# Patient Record
Sex: Female | Born: 1985 | Race: Black or African American | Hispanic: No | Marital: Married | Smoking: Former smoker
Health system: Southern US, Community
[De-identification: ages and names within clinical notes are randomized; demographics above are authoritative.]

## PROBLEM LIST (undated history)

## (undated) ENCOUNTER — Inpatient Hospital Stay: Payer: Self-pay

## (undated) DIAGNOSIS — K649 Unspecified hemorrhoids: Secondary | ICD-10-CM

## (undated) DIAGNOSIS — L309 Dermatitis, unspecified: Secondary | ICD-10-CM

## (undated) DIAGNOSIS — R51 Headache: Secondary | ICD-10-CM

## (undated) DIAGNOSIS — A749 Chlamydial infection, unspecified: Secondary | ICD-10-CM

## (undated) DIAGNOSIS — E669 Obesity, unspecified: Secondary | ICD-10-CM

## (undated) DIAGNOSIS — M549 Dorsalgia, unspecified: Secondary | ICD-10-CM

## (undated) DIAGNOSIS — F32A Depression, unspecified: Secondary | ICD-10-CM

## (undated) DIAGNOSIS — F329 Major depressive disorder, single episode, unspecified: Secondary | ICD-10-CM

## (undated) DIAGNOSIS — R519 Headache, unspecified: Secondary | ICD-10-CM

## (undated) DIAGNOSIS — F4310 Post-traumatic stress disorder, unspecified: Secondary | ICD-10-CM

## (undated) DIAGNOSIS — N186 End stage renal disease: Secondary | ICD-10-CM

## (undated) DIAGNOSIS — I639 Cerebral infarction, unspecified: Secondary | ICD-10-CM

## (undated) DIAGNOSIS — I10 Essential (primary) hypertension: Secondary | ICD-10-CM

## (undated) DIAGNOSIS — E109 Type 1 diabetes mellitus without complications: Secondary | ICD-10-CM

## (undated) HISTORY — PX: VASCULAR ACCESS SURGERY: SHX5217

## (undated) HISTORY — DX: End stage renal disease: N18.6

## (undated) HISTORY — DX: Type 1 diabetes mellitus without complications: E10.9

## (undated) HISTORY — PX: DILATION AND CURETTAGE, DIAGNOSTIC / THERAPEUTIC: SUR384

## (undated) HISTORY — PX: TONSILLECTOMY: SUR1361

## (undated) HISTORY — PX: EYE SURGERY: SHX253

---
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
## 2005-06-30 ENCOUNTER — Ambulatory Visit (EMERGENCY_DEPARTMENT_HOSPITAL): Payer: Self-pay

## 2006-07-19 ENCOUNTER — Ambulatory Visit (EMERGENCY_DEPARTMENT_HOSPITAL): Payer: Self-pay

## 2006-07-23 ENCOUNTER — Emergency Department: Payer: Self-pay | Admitting: Emergency Medicine

## 2006-07-28 ENCOUNTER — Encounter (HOSPITAL_BASED_OUTPATIENT_CLINIC_OR_DEPARTMENT_OTHER): Payer: Self-pay

## 2006-08-04 ENCOUNTER — Encounter (HOSPITAL_BASED_OUTPATIENT_CLINIC_OR_DEPARTMENT_OTHER): Payer: Self-pay | Admitting: Registered"

## 2006-08-16 ENCOUNTER — Encounter (HOSPITAL_BASED_OUTPATIENT_CLINIC_OR_DEPARTMENT_OTHER): Payer: Self-pay | Admitting: Nurse Practitioner

## 2006-08-16 DIAGNOSIS — I10 Essential (primary) hypertension: Secondary | ICD-10-CM

## 2006-08-16 DIAGNOSIS — IMO0002 Reserved for concepts with insufficient information to code with codable children: Secondary | ICD-10-CM

## 2006-08-16 DIAGNOSIS — Z794 Long term (current) use of insulin: Secondary | ICD-10-CM

## 2006-08-16 DIAGNOSIS — R944 Abnormal results of kidney function studies: Secondary | ICD-10-CM

## 2006-08-30 ENCOUNTER — Encounter (HOSPITAL_BASED_OUTPATIENT_CLINIC_OR_DEPARTMENT_OTHER): Payer: Self-pay | Admitting: Registered"

## 2006-09-06 ENCOUNTER — Encounter (HOSPITAL_BASED_OUTPATIENT_CLINIC_OR_DEPARTMENT_OTHER): Payer: Self-pay | Admitting: Obstetrics & Gynecology

## 2006-09-06 ENCOUNTER — Encounter (HOSPITAL_BASED_OUTPATIENT_CLINIC_OR_DEPARTMENT_OTHER): Payer: Self-pay | Admitting: Nurse Practitioner

## 2006-09-06 DIAGNOSIS — I10 Essential (primary) hypertension: Secondary | ICD-10-CM

## 2006-09-06 DIAGNOSIS — R011 Cardiac murmur, unspecified: Secondary | ICD-10-CM

## 2006-09-06 DIAGNOSIS — E109 Type 1 diabetes mellitus without complications: Secondary | ICD-10-CM

## 2006-10-04 ENCOUNTER — Encounter (HOSPITAL_BASED_OUTPATIENT_CLINIC_OR_DEPARTMENT_OTHER): Payer: Self-pay | Admitting: Registered"

## 2006-10-11 ENCOUNTER — Encounter (HOSPITAL_BASED_OUTPATIENT_CLINIC_OR_DEPARTMENT_OTHER): Payer: Self-pay | Admitting: Obstetrics & Gynecology

## 2006-11-21 ENCOUNTER — Encounter (HOSPITAL_BASED_OUTPATIENT_CLINIC_OR_DEPARTMENT_OTHER): Payer: Self-pay | Admitting: Internal Medicine

## 2006-11-21 ENCOUNTER — Encounter (HOSPITAL_BASED_OUTPATIENT_CLINIC_OR_DEPARTMENT_OTHER): Payer: Self-pay | Admitting: Registered"

## 2006-11-21 DIAGNOSIS — Z794 Long term (current) use of insulin: Secondary | ICD-10-CM

## 2006-11-21 DIAGNOSIS — Z3009 Encounter for other general counseling and advice on contraception: Secondary | ICD-10-CM

## 2006-11-21 DIAGNOSIS — E109 Type 1 diabetes mellitus without complications: Secondary | ICD-10-CM

## 2006-11-21 DIAGNOSIS — I10 Essential (primary) hypertension: Secondary | ICD-10-CM

## 2007-01-20 ENCOUNTER — Encounter (HOSPITAL_BASED_OUTPATIENT_CLINIC_OR_DEPARTMENT_OTHER): Payer: Self-pay

## 2007-03-03 ENCOUNTER — Encounter (HOSPITAL_BASED_OUTPATIENT_CLINIC_OR_DEPARTMENT_OTHER): Payer: Self-pay | Admitting: Internal Medicine

## 2007-04-11 ENCOUNTER — Other Ambulatory Visit (HOSPITAL_BASED_OUTPATIENT_CLINIC_OR_DEPARTMENT_OTHER): Payer: Self-pay | Admitting: Internal Medicine

## 2007-04-11 ENCOUNTER — Encounter (HOSPITAL_BASED_OUTPATIENT_CLINIC_OR_DEPARTMENT_OTHER): Payer: Medicaid Other | Admitting: Internal Medicine

## 2007-04-11 DIAGNOSIS — I10 Essential (primary) hypertension: Secondary | ICD-10-CM

## 2007-04-11 DIAGNOSIS — E119 Type 2 diabetes mellitus without complications: Secondary | ICD-10-CM

## 2007-04-11 DIAGNOSIS — Z124 Encounter for screening for malignant neoplasm of cervix: Secondary | ICD-10-CM

## 2007-04-13 LAB — CERVICAL CANCER SCREENING: Cytologic Impression: NEGATIVE

## 2007-05-12 ENCOUNTER — Encounter (HOSPITAL_BASED_OUTPATIENT_CLINIC_OR_DEPARTMENT_OTHER): Payer: Medicaid Other | Admitting: Internal Medicine

## 2007-05-12 DIAGNOSIS — I10 Essential (primary) hypertension: Secondary | ICD-10-CM

## 2007-05-12 DIAGNOSIS — E109 Type 1 diabetes mellitus without complications: Secondary | ICD-10-CM

## 2007-06-06 ENCOUNTER — Encounter (HOSPITAL_BASED_OUTPATIENT_CLINIC_OR_DEPARTMENT_OTHER): Payer: Self-pay | Admitting: Registered"

## 2007-06-09 ENCOUNTER — Encounter (HOSPITAL_BASED_OUTPATIENT_CLINIC_OR_DEPARTMENT_OTHER): Payer: Self-pay | Admitting: Internal Medicine

## 2007-06-16 ENCOUNTER — Encounter (HOSPITAL_BASED_OUTPATIENT_CLINIC_OR_DEPARTMENT_OTHER): Payer: Medicaid Other | Admitting: Internal Medicine

## 2007-06-16 DIAGNOSIS — I10 Essential (primary) hypertension: Secondary | ICD-10-CM

## 2007-06-16 DIAGNOSIS — E109 Type 1 diabetes mellitus without complications: Secondary | ICD-10-CM

## 2007-06-19 ENCOUNTER — Encounter (HOSPITAL_BASED_OUTPATIENT_CLINIC_OR_DEPARTMENT_OTHER): Payer: Self-pay | Admitting: Registered"

## 2007-06-26 ENCOUNTER — Encounter (HOSPITAL_BASED_OUTPATIENT_CLINIC_OR_DEPARTMENT_OTHER): Payer: Self-pay | Admitting: Registered"

## 2007-07-10 ENCOUNTER — Encounter (HOSPITAL_BASED_OUTPATIENT_CLINIC_OR_DEPARTMENT_OTHER): Payer: Self-pay | Admitting: Internal Medicine

## 2007-08-02 ENCOUNTER — Encounter (HOSPITAL_BASED_OUTPATIENT_CLINIC_OR_DEPARTMENT_OTHER): Payer: Self-pay | Admitting: "Endocrinology

## 2007-09-08 ENCOUNTER — Encounter (HOSPITAL_BASED_OUTPATIENT_CLINIC_OR_DEPARTMENT_OTHER): Payer: Medicaid Other

## 2007-09-15 ENCOUNTER — Inpatient Hospital Stay: Payer: Self-pay | Admitting: Obstetrics & Gynecology

## 2007-10-09 ENCOUNTER — Encounter: Payer: Self-pay | Admitting: Maternal & Fetal Medicine

## 2008-01-10 ENCOUNTER — Ambulatory Visit (EMERGENCY_DEPARTMENT_HOSPITAL): Payer: Self-pay

## 2008-01-16 ENCOUNTER — Encounter (HOSPITAL_BASED_OUTPATIENT_CLINIC_OR_DEPARTMENT_OTHER): Payer: Self-pay | Admitting: Internal Medicine

## 2008-01-22 ENCOUNTER — Encounter (HOSPITAL_BASED_OUTPATIENT_CLINIC_OR_DEPARTMENT_OTHER): Payer: Self-pay | Admitting: Internal Medicine

## 2008-02-20 ENCOUNTER — Encounter (HOSPITAL_BASED_OUTPATIENT_CLINIC_OR_DEPARTMENT_OTHER): Payer: Self-pay | Admitting: Nurse Practitioner

## 2008-02-20 DIAGNOSIS — E109 Type 1 diabetes mellitus without complications: Secondary | ICD-10-CM

## 2008-02-20 DIAGNOSIS — I10 Essential (primary) hypertension: Secondary | ICD-10-CM

## 2008-03-27 ENCOUNTER — Encounter (HOSPITAL_BASED_OUTPATIENT_CLINIC_OR_DEPARTMENT_OTHER): Payer: Self-pay

## 2008-04-02 ENCOUNTER — Encounter (HOSPITAL_BASED_OUTPATIENT_CLINIC_OR_DEPARTMENT_OTHER): Payer: Self-pay | Admitting: Nurse Practitioner

## 2008-04-02 ENCOUNTER — Encounter (HOSPITAL_BASED_OUTPATIENT_CLINIC_OR_DEPARTMENT_OTHER): Payer: Self-pay | Admitting: Registered"

## 2008-07-12 ENCOUNTER — Encounter (HOSPITAL_BASED_OUTPATIENT_CLINIC_OR_DEPARTMENT_OTHER): Payer: Self-pay

## 2008-08-05 ENCOUNTER — Ambulatory Visit: Payer: Self-pay | Admitting: Unknown Physician Specialty

## 2008-08-06 ENCOUNTER — Ambulatory Visit: Payer: Self-pay | Admitting: Unknown Physician Specialty

## 2008-09-11 ENCOUNTER — Encounter (HOSPITAL_BASED_OUTPATIENT_CLINIC_OR_DEPARTMENT_OTHER): Payer: Self-pay

## 2008-09-16 ENCOUNTER — Ambulatory Visit (HOSPITAL_BASED_OUTPATIENT_CLINIC_OR_DEPARTMENT_OTHER): Payer: No Typology Code available for payment source | Admitting: Nurse Practitioner

## 2008-09-16 ENCOUNTER — Encounter (HOSPITAL_BASED_OUTPATIENT_CLINIC_OR_DEPARTMENT_OTHER): Payer: Self-pay | Admitting: Nurse Practitioner

## 2008-09-16 DIAGNOSIS — E119 Type 2 diabetes mellitus without complications: Secondary | ICD-10-CM

## 2008-09-16 DIAGNOSIS — I10 Essential (primary) hypertension: Secondary | ICD-10-CM

## 2009-01-09 ENCOUNTER — Ambulatory Visit (HOSPITAL_BASED_OUTPATIENT_CLINIC_OR_DEPARTMENT_OTHER): Payer: No Typology Code available for payment source | Admitting: Nurse Practitioner

## 2009-01-09 DIAGNOSIS — Z111 Encounter for screening for respiratory tuberculosis: Secondary | ICD-10-CM

## 2009-01-09 DIAGNOSIS — Z0289 Encounter for other administrative examinations: Secondary | ICD-10-CM

## 2009-01-09 DIAGNOSIS — Z23 Encounter for immunization: Secondary | ICD-10-CM

## 2009-01-13 ENCOUNTER — Encounter (HOSPITAL_BASED_OUTPATIENT_CLINIC_OR_DEPARTMENT_OTHER): Payer: Self-pay

## 2009-02-03 ENCOUNTER — Ambulatory Visit (HOSPITAL_BASED_OUTPATIENT_CLINIC_OR_DEPARTMENT_OTHER): Payer: Self-pay | Attending: Nurse Practitioner

## 2009-02-12 ENCOUNTER — Ambulatory Visit (HOSPITAL_BASED_OUTPATIENT_CLINIC_OR_DEPARTMENT_OTHER): Payer: Self-pay | Attending: Internal Medicine | Admitting: Internal Medicine

## 2009-02-12 DIAGNOSIS — E109 Type 1 diabetes mellitus without complications: Secondary | ICD-10-CM | POA: Insufficient documentation

## 2009-03-07 ENCOUNTER — Encounter (HOSPITAL_BASED_OUTPATIENT_CLINIC_OR_DEPARTMENT_OTHER): Payer: Self-pay

## 2009-03-11 ENCOUNTER — Ambulatory Visit (HOSPITAL_BASED_OUTPATIENT_CLINIC_OR_DEPARTMENT_OTHER)
Admit: 2009-03-11 | Discharge: 2009-03-11 | Disposition: A | Discharge status: Home / Self Care | Payer: Self-pay | Attending: Nurse Practitioner | Admitting: Nurse Practitioner

## 2009-03-12 ENCOUNTER — Ambulatory Visit (HOSPITAL_BASED_OUTPATIENT_CLINIC_OR_DEPARTMENT_OTHER): Payer: Self-pay | Attending: Internal Medicine

## 2009-03-12 DIAGNOSIS — Z794 Long term (current) use of insulin: Secondary | ICD-10-CM | POA: Insufficient documentation

## 2009-03-12 DIAGNOSIS — E119 Type 2 diabetes mellitus without complications: Secondary | ICD-10-CM | POA: Insufficient documentation

## 2009-03-12 DIAGNOSIS — Z5181 Encounter for therapeutic drug level monitoring: Secondary | ICD-10-CM | POA: Insufficient documentation

## 2009-03-17 ENCOUNTER — Encounter (HOSPITAL_BASED_OUTPATIENT_CLINIC_OR_DEPARTMENT_OTHER): Payer: Self-pay

## 2009-03-17 ENCOUNTER — Encounter (HOSPITAL_BASED_OUTPATIENT_CLINIC_OR_DEPARTMENT_OTHER): Payer: Self-pay | Admitting: Obstetrics & Gynecology

## 2009-03-27 ENCOUNTER — Ambulatory Visit (HOSPITAL_BASED_OUTPATIENT_CLINIC_OR_DEPARTMENT_OTHER): Payer: Self-pay | Attending: Surgery

## 2009-03-27 ENCOUNTER — Other Ambulatory Visit (HOSPITAL_BASED_OUTPATIENT_CLINIC_OR_DEPARTMENT_OTHER): Payer: Self-pay | Admitting: Obstetrics & Gynecology

## 2009-03-27 DIAGNOSIS — Z113 Encounter for screening for infections with a predominantly sexual mode of transmission: Secondary | ICD-10-CM | POA: Insufficient documentation

## 2009-03-27 DIAGNOSIS — Z118 Encounter for screening for other infectious and parasitic diseases: Secondary | ICD-10-CM | POA: Insufficient documentation

## 2009-03-27 DIAGNOSIS — Z3201 Encounter for pregnancy test, result positive: Secondary | ICD-10-CM | POA: Insufficient documentation

## 2009-03-27 DIAGNOSIS — A64 Unspecified sexually transmitted disease: Secondary | ICD-10-CM | POA: Insufficient documentation

## 2009-03-27 DIAGNOSIS — O10019 Pre-existing essential hypertension complicating pregnancy, unspecified trimester: Secondary | ICD-10-CM | POA: Insufficient documentation

## 2009-03-27 DIAGNOSIS — Z01419 Encounter for gynecological examination (general) (routine) without abnormal findings: Secondary | ICD-10-CM | POA: Insufficient documentation

## 2009-03-27 DIAGNOSIS — E109 Type 1 diabetes mellitus without complications: Secondary | ICD-10-CM

## 2009-04-01 LAB — CERVICAL CANCER SCREENING: Cytologic Impression: NEGATIVE

## 2009-04-07 ENCOUNTER — Encounter (HOSPITAL_BASED_OUTPATIENT_CLINIC_OR_DEPARTMENT_OTHER): Payer: Self-pay

## 2009-04-08 ENCOUNTER — Other Ambulatory Visit (HOSPITAL_BASED_OUTPATIENT_CLINIC_OR_DEPARTMENT_OTHER): Payer: Self-pay | Admitting: Obstetrics & Gynecology

## 2009-04-08 ENCOUNTER — Ambulatory Visit (HOSPITAL_BASED_OUTPATIENT_CLINIC_OR_DEPARTMENT_OTHER): Payer: Medicaid Other | Admitting: Obstetrics and Gynecology

## 2009-04-08 ENCOUNTER — Inpatient Hospital Stay (HOSPITAL_COMMUNITY)
Admission: AD | Admit: 2009-04-08 | Payer: No Typology Code available for payment source | Source: Ambulatory Visit | Admitting: Maternal & Fetal Medicine

## 2009-04-08 ENCOUNTER — Inpatient Hospital Stay
Admission: AD | Admit: 2009-04-08 | Discharge: 2009-04-10 | Disposition: A | Discharge status: Home / Self Care | Payer: Medicaid Other | Source: Ambulatory Visit | Attending: Obstetrics & Gynecology | Admitting: Obstetrics & Gynecology

## 2009-04-08 ENCOUNTER — Inpatient Hospital Stay (HOSPITAL_COMMUNITY): Payer: Medicaid Other | Admitting: Obstetrics & Gynecology

## 2009-04-08 ENCOUNTER — Ambulatory Visit (HOSPITAL_BASED_OUTPATIENT_CLINIC_OR_DEPARTMENT_OTHER): Payer: Medicaid Other

## 2009-04-08 DIAGNOSIS — O2390 Unspecified genitourinary tract infection in pregnancy, unspecified trimester: Secondary | ICD-10-CM | POA: Diagnosis present

## 2009-04-08 DIAGNOSIS — O10019 Pre-existing essential hypertension complicating pregnancy, unspecified trimester: Secondary | ICD-10-CM | POA: Diagnosis present

## 2009-04-08 DIAGNOSIS — N390 Urinary tract infection, site not specified: Secondary | ICD-10-CM | POA: Diagnosis present

## 2009-04-08 DIAGNOSIS — E109 Type 1 diabetes mellitus without complications: Secondary | ICD-10-CM | POA: Diagnosis present

## 2009-04-08 DIAGNOSIS — Z3480 Encounter for supervision of other normal pregnancy, unspecified trimester: Secondary | ICD-10-CM

## 2009-04-08 DIAGNOSIS — Z794 Long term (current) use of insulin: Secondary | ICD-10-CM

## 2009-04-08 LAB — PR SONO PREG FIRST TRIMESTER

## 2009-04-08 LAB — PR OB US NUCHAL MEAS, 1 GEST

## 2009-04-09 DIAGNOSIS — O10019 Pre-existing essential hypertension complicating pregnancy, unspecified trimester: Secondary | ICD-10-CM

## 2009-04-09 DIAGNOSIS — E119 Type 2 diabetes mellitus without complications: Secondary | ICD-10-CM

## 2009-04-10 DIAGNOSIS — R8271 Bacteriuria: Secondary | ICD-10-CM

## 2009-04-10 DIAGNOSIS — O99891 Other specified diseases and conditions complicating pregnancy: Secondary | ICD-10-CM

## 2009-04-15 ENCOUNTER — Ambulatory Visit (HOSPITAL_BASED_OUTPATIENT_CLINIC_OR_DEPARTMENT_OTHER): Payer: Medicaid Other | Admitting: Registered"

## 2009-04-15 ENCOUNTER — Ambulatory Visit: Payer: Medicaid Other | Attending: Obstetrics & Gynecology | Admitting: Obstetrics and Gynecology

## 2009-04-15 DIAGNOSIS — Z794 Long term (current) use of insulin: Secondary | ICD-10-CM | POA: Insufficient documentation

## 2009-04-15 DIAGNOSIS — D649 Anemia, unspecified: Secondary | ICD-10-CM | POA: Insufficient documentation

## 2009-04-15 DIAGNOSIS — O10019 Pre-existing essential hypertension complicating pregnancy, unspecified trimester: Secondary | ICD-10-CM | POA: Insufficient documentation

## 2009-04-15 DIAGNOSIS — O2390 Unspecified genitourinary tract infection in pregnancy, unspecified trimester: Secondary | ICD-10-CM | POA: Insufficient documentation

## 2009-04-15 DIAGNOSIS — E109 Type 1 diabetes mellitus without complications: Secondary | ICD-10-CM | POA: Insufficient documentation

## 2009-04-21 ENCOUNTER — Encounter (HOSPITAL_BASED_OUTPATIENT_CLINIC_OR_DEPARTMENT_OTHER): Payer: Self-pay | Admitting: Internal Medicine

## 2009-04-21 ENCOUNTER — Ambulatory Visit: Payer: Medicaid Other | Attending: Obstetrics & Gynecology | Admitting: Obstetrics and Gynecology

## 2009-04-21 DIAGNOSIS — E119 Type 2 diabetes mellitus without complications: Secondary | ICD-10-CM | POA: Insufficient documentation

## 2009-04-21 DIAGNOSIS — O10019 Pre-existing essential hypertension complicating pregnancy, unspecified trimester: Secondary | ICD-10-CM | POA: Insufficient documentation

## 2009-04-21 DIAGNOSIS — O2390 Unspecified genitourinary tract infection in pregnancy, unspecified trimester: Secondary | ICD-10-CM | POA: Insufficient documentation

## 2009-04-23 ENCOUNTER — Encounter (HOSPITAL_BASED_OUTPATIENT_CLINIC_OR_DEPARTMENT_OTHER): Payer: Self-pay | Admitting: Internal Medicine

## 2009-04-28 ENCOUNTER — Institutional Professional Consult (permissible substitution) (HOSPITAL_BASED_OUTPATIENT_CLINIC_OR_DEPARTMENT_OTHER): Payer: Medicaid Other | Admitting: Obstetrics and Gynecology

## 2009-04-29 ENCOUNTER — Institutional Professional Consult (permissible substitution) (HOSPITAL_BASED_OUTPATIENT_CLINIC_OR_DEPARTMENT_OTHER): Payer: Medicaid Other | Admitting: Obstetrics and Gynecology

## 2009-05-02 ENCOUNTER — Inpatient Hospital Stay
Admission: AD | Admit: 2009-05-02 | Discharge: 2009-05-03 | Disposition: A | Discharge status: Home / Self Care | Payer: Medicaid Other | Source: Ambulatory Visit | Attending: Obstetrics & Gynecology | Admitting: Obstetrics & Gynecology

## 2009-05-02 ENCOUNTER — Inpatient Hospital Stay (HOSPITAL_COMMUNITY): Payer: Medicaid Other | Admitting: Maternal & Fetal Medicine

## 2009-05-02 ENCOUNTER — Emergency Department (EMERGENCY_DEPARTMENT_HOSPITAL)
Admission: EM | Admit: 2009-05-02 | Discharge: 2009-05-02 | Disposition: A | Discharge status: Home / Self Care | Payer: Medicaid Other | Source: Home / Self Care

## 2009-05-02 DIAGNOSIS — E109 Type 1 diabetes mellitus without complications: Secondary | ICD-10-CM | POA: Diagnosis present

## 2009-05-02 DIAGNOSIS — O2390 Unspecified genitourinary tract infection in pregnancy, unspecified trimester: Secondary | ICD-10-CM | POA: Diagnosis present

## 2009-05-02 DIAGNOSIS — E119 Type 2 diabetes mellitus without complications: Secondary | ICD-10-CM

## 2009-05-02 DIAGNOSIS — O10019 Pre-existing essential hypertension complicating pregnancy, unspecified trimester: Secondary | ICD-10-CM | POA: Diagnosis present

## 2009-05-02 DIAGNOSIS — N390 Urinary tract infection, site not specified: Secondary | ICD-10-CM | POA: Diagnosis present

## 2009-05-03 DIAGNOSIS — E119 Type 2 diabetes mellitus without complications: Secondary | ICD-10-CM

## 2009-05-05 ENCOUNTER — Ambulatory Visit (HOSPITAL_BASED_OUTPATIENT_CLINIC_OR_DEPARTMENT_OTHER): Payer: No Typology Code available for payment source

## 2009-05-05 ENCOUNTER — Ambulatory Visit
Payer: No Typology Code available for payment source | Attending: Obstetrics & Gynecology | Admitting: Obstetrics and Gynecology

## 2009-05-05 DIAGNOSIS — Z331 Pregnant state, incidental: Secondary | ICD-10-CM | POA: Insufficient documentation

## 2009-05-05 DIAGNOSIS — O10019 Pre-existing essential hypertension complicating pregnancy, unspecified trimester: Secondary | ICD-10-CM | POA: Insufficient documentation

## 2009-05-05 DIAGNOSIS — D509 Iron deficiency anemia, unspecified: Secondary | ICD-10-CM | POA: Insufficient documentation

## 2009-05-05 DIAGNOSIS — E119 Type 2 diabetes mellitus without complications: Secondary | ICD-10-CM | POA: Insufficient documentation

## 2009-05-05 DIAGNOSIS — O2390 Unspecified genitourinary tract infection in pregnancy, unspecified trimester: Secondary | ICD-10-CM | POA: Insufficient documentation

## 2009-05-12 ENCOUNTER — Ambulatory Visit (HOSPITAL_BASED_OUTPATIENT_CLINIC_OR_DEPARTMENT_OTHER): Payer: No Typology Code available for payment source | Admitting: Clinical

## 2009-05-12 ENCOUNTER — Ambulatory Visit
Payer: No Typology Code available for payment source | Attending: Obstetrics & Gynecology | Admitting: Obstetrics and Gynecology

## 2009-05-12 DIAGNOSIS — O2390 Unspecified genitourinary tract infection in pregnancy, unspecified trimester: Secondary | ICD-10-CM | POA: Insufficient documentation

## 2009-05-12 DIAGNOSIS — E119 Type 2 diabetes mellitus without complications: Secondary | ICD-10-CM | POA: Insufficient documentation

## 2009-05-12 DIAGNOSIS — Z331 Pregnant state, incidental: Secondary | ICD-10-CM | POA: Insufficient documentation

## 2009-05-12 DIAGNOSIS — O10019 Pre-existing essential hypertension complicating pregnancy, unspecified trimester: Secondary | ICD-10-CM | POA: Insufficient documentation

## 2009-05-12 DIAGNOSIS — D509 Iron deficiency anemia, unspecified: Secondary | ICD-10-CM | POA: Insufficient documentation

## 2009-05-19 ENCOUNTER — Ambulatory Visit (HOSPITAL_BASED_OUTPATIENT_CLINIC_OR_DEPARTMENT_OTHER): Payer: No Typology Code available for payment source

## 2009-05-19 ENCOUNTER — Ambulatory Visit (HOSPITAL_BASED_OUTPATIENT_CLINIC_OR_DEPARTMENT_OTHER): Payer: No Typology Code available for payment source | Admitting: Obstetrics and Gynecology

## 2009-05-19 ENCOUNTER — Other Ambulatory Visit: Payer: Self-pay | Admitting: Obstetrics & Gynecology

## 2009-05-19 ENCOUNTER — Ambulatory Visit: Payer: No Typology Code available for payment source | Attending: Obstetrics & Gynecology

## 2009-05-19 DIAGNOSIS — Z331 Pregnant state, incidental: Secondary | ICD-10-CM | POA: Insufficient documentation

## 2009-05-19 DIAGNOSIS — E119 Type 2 diabetes mellitus without complications: Secondary | ICD-10-CM | POA: Insufficient documentation

## 2009-05-19 DIAGNOSIS — D509 Iron deficiency anemia, unspecified: Secondary | ICD-10-CM | POA: Insufficient documentation

## 2009-05-19 DIAGNOSIS — O2390 Unspecified genitourinary tract infection in pregnancy, unspecified trimester: Secondary | ICD-10-CM | POA: Insufficient documentation

## 2009-05-19 DIAGNOSIS — Z3480 Encounter for supervision of other normal pregnancy, unspecified trimester: Secondary | ICD-10-CM

## 2009-05-19 DIAGNOSIS — O10019 Pre-existing essential hypertension complicating pregnancy, unspecified trimester: Secondary | ICD-10-CM | POA: Insufficient documentation

## 2009-05-21 LAB — PR SONO PREG 2ND/3RD TRIMESTER

## 2009-05-26 ENCOUNTER — Ambulatory Visit
Payer: No Typology Code available for payment source | Attending: Obstetrics & Gynecology | Admitting: Obstetrics and Gynecology

## 2009-05-26 DIAGNOSIS — E119 Type 2 diabetes mellitus without complications: Secondary | ICD-10-CM | POA: Insufficient documentation

## 2009-05-26 DIAGNOSIS — O2390 Unspecified genitourinary tract infection in pregnancy, unspecified trimester: Secondary | ICD-10-CM | POA: Insufficient documentation

## 2009-05-26 DIAGNOSIS — O10019 Pre-existing essential hypertension complicating pregnancy, unspecified trimester: Secondary | ICD-10-CM | POA: Insufficient documentation

## 2009-05-26 DIAGNOSIS — D509 Iron deficiency anemia, unspecified: Secondary | ICD-10-CM | POA: Insufficient documentation

## 2009-05-29 ENCOUNTER — Inpatient Hospital Stay (HOSPITAL_COMMUNITY)
Admission: AD | Admit: 2009-05-29 | Payer: No Typology Code available for payment source | Source: Home / Self Care | Admitting: Obstetrics/Gynecology

## 2009-05-29 ENCOUNTER — Ambulatory Visit
Admission: AD | Admit: 2009-05-29 | Discharge: 2009-05-30 | Disposition: A | Discharge status: Home / Self Care | Payer: No Typology Code available for payment source | Attending: Obstetrics/Gynecology | Admitting: Obstetrics/Gynecology

## 2009-05-29 ENCOUNTER — Ambulatory Visit (HOSPITAL_BASED_OUTPATIENT_CLINIC_OR_DEPARTMENT_OTHER): Payer: No Typology Code available for payment source | Admitting: Obstetrics/Gynecology

## 2009-05-29 DIAGNOSIS — N390 Urinary tract infection, site not specified: Secondary | ICD-10-CM | POA: Insufficient documentation

## 2009-05-29 DIAGNOSIS — R0989 Other specified symptoms and signs involving the circulatory and respiratory systems: Secondary | ICD-10-CM

## 2009-05-29 DIAGNOSIS — O99891 Other specified diseases and conditions complicating pregnancy: Secondary | ICD-10-CM

## 2009-05-29 DIAGNOSIS — O10019 Pre-existing essential hypertension complicating pregnancy, unspecified trimester: Secondary | ICD-10-CM

## 2009-06-05 ENCOUNTER — Ambulatory Visit (HOSPITAL_BASED_OUTPATIENT_CLINIC_OR_DEPARTMENT_OTHER): Payer: No Typology Code available for payment source

## 2009-06-05 ENCOUNTER — Inpatient Hospital Stay
Admission: AD | Admit: 2009-06-05 | Discharge: 2009-06-13 | DRG: 886 | Disposition: A | Discharge status: Home / Self Care | Payer: No Typology Code available for payment source | Attending: Obstetrics/Gynecology | Admitting: Obstetrics/Gynecology

## 2009-06-05 ENCOUNTER — Ambulatory Visit (HOSPITAL_BASED_OUTPATIENT_CLINIC_OR_DEPARTMENT_OTHER): Payer: No Typology Code available for payment source | Admitting: Obstetrics & Gynecology

## 2009-06-05 ENCOUNTER — Inpatient Hospital Stay (HOSPITAL_COMMUNITY): Payer: No Typology Code available for payment source | Admitting: Obstetrics/Gynecology

## 2009-06-05 DIAGNOSIS — D568 Other thalassemias: Secondary | ICD-10-CM | POA: Diagnosis present

## 2009-06-05 DIAGNOSIS — O10419 Pre-existing secondary hypertension complicating pregnancy, unspecified trimester: Secondary | ICD-10-CM | POA: Insufficient documentation

## 2009-06-05 DIAGNOSIS — E119 Type 2 diabetes mellitus without complications: Secondary | ICD-10-CM | POA: Diagnosis present

## 2009-06-05 DIAGNOSIS — R0601 Orthopnea: Secondary | ICD-10-CM | POA: Diagnosis present

## 2009-06-05 DIAGNOSIS — O10019 Pre-existing essential hypertension complicating pregnancy, unspecified trimester: Secondary | ICD-10-CM | POA: Diagnosis present

## 2009-06-05 DIAGNOSIS — R0989 Other specified symptoms and signs involving the circulatory and respiratory systems: Secondary | ICD-10-CM

## 2009-06-05 DIAGNOSIS — I509 Heart failure, unspecified: Secondary | ICD-10-CM | POA: Diagnosis present

## 2009-06-05 DIAGNOSIS — J811 Chronic pulmonary edema: Secondary | ICD-10-CM | POA: Diagnosis present

## 2009-06-05 DIAGNOSIS — O2390 Unspecified genitourinary tract infection in pregnancy, unspecified trimester: Secondary | ICD-10-CM | POA: Diagnosis present

## 2009-06-05 DIAGNOSIS — N390 Urinary tract infection, site not specified: Secondary | ICD-10-CM | POA: Diagnosis present

## 2009-06-05 DIAGNOSIS — J90 Pleural effusion, not elsewhere classified: Secondary | ICD-10-CM | POA: Diagnosis present

## 2009-06-05 DIAGNOSIS — O99419 Diseases of the circulatory system complicating pregnancy, unspecified trimester: Secondary | ICD-10-CM | POA: Diagnosis present

## 2009-06-05 DIAGNOSIS — O99891 Other specified diseases and conditions complicating pregnancy: Principal | ICD-10-CM | POA: Diagnosis present

## 2009-06-05 DIAGNOSIS — I2510 Atherosclerotic heart disease of native coronary artery without angina pectoris: Secondary | ICD-10-CM | POA: Diagnosis present

## 2009-06-05 DIAGNOSIS — I151 Hypertension secondary to other renal disorders: Secondary | ICD-10-CM

## 2009-06-05 DIAGNOSIS — I10 Essential (primary) hypertension: Secondary | ICD-10-CM

## 2009-06-05 DIAGNOSIS — O321XX0 Maternal care for breech presentation, not applicable or unspecified: Secondary | ICD-10-CM | POA: Diagnosis present

## 2009-06-06 ENCOUNTER — Inpatient Hospital Stay (HOSPITAL_BASED_OUTPATIENT_CLINIC_OR_DEPARTMENT_OTHER): Payer: No Typology Code available for payment source

## 2009-06-06 ENCOUNTER — Ambulatory Visit (HOSPITAL_BASED_OUTPATIENT_CLINIC_OR_DEPARTMENT_OTHER): Payer: No Typology Code available for payment source

## 2009-06-06 ENCOUNTER — Other Ambulatory Visit: Payer: Self-pay | Admitting: Obstetrics & Gynecology

## 2009-06-06 ENCOUNTER — Encounter (HOSPITAL_BASED_OUTPATIENT_CLINIC_OR_DEPARTMENT_OTHER): Payer: No Typology Code available for payment source | Admitting: Ophthalmology

## 2009-06-06 DIAGNOSIS — R0601 Orthopnea: Secondary | ICD-10-CM

## 2009-06-06 DIAGNOSIS — R0989 Other specified symptoms and signs involving the circulatory and respiratory systems: Secondary | ICD-10-CM

## 2009-06-06 DIAGNOSIS — I517 Cardiomegaly: Secondary | ICD-10-CM

## 2009-06-06 DIAGNOSIS — O99419 Diseases of the circulatory system complicating pregnancy, unspecified trimester: Secondary | ICD-10-CM

## 2009-06-06 DIAGNOSIS — I10 Essential (primary) hypertension: Secondary | ICD-10-CM

## 2009-06-06 DIAGNOSIS — I2510 Atherosclerotic heart disease of native coronary artery without angina pectoris: Secondary | ICD-10-CM

## 2009-06-06 DIAGNOSIS — J984 Other disorders of lung: Secondary | ICD-10-CM

## 2009-06-06 DIAGNOSIS — O10019 Pre-existing essential hypertension complicating pregnancy, unspecified trimester: Secondary | ICD-10-CM

## 2009-06-06 DIAGNOSIS — I998 Other disorder of circulatory system: Secondary | ICD-10-CM

## 2009-06-06 DIAGNOSIS — R011 Cardiac murmur, unspecified: Secondary | ICD-10-CM

## 2009-06-09 ENCOUNTER — Ambulatory Visit (HOSPITAL_BASED_OUTPATIENT_CLINIC_OR_DEPARTMENT_OTHER): Payer: No Typology Code available for payment source

## 2009-06-09 DIAGNOSIS — R0989 Other specified symptoms and signs involving the circulatory and respiratory systems: Secondary | ICD-10-CM

## 2009-06-09 LAB — PR US ORGAN ART/VEIN COMP

## 2009-06-10 ENCOUNTER — Other Ambulatory Visit (HOSPITAL_BASED_OUTPATIENT_CLINIC_OR_DEPARTMENT_OTHER): Payer: Self-pay | Admitting: Obstetrics & Gynecology

## 2009-06-11 ENCOUNTER — Ambulatory Visit (HOSPITAL_BASED_OUTPATIENT_CLINIC_OR_DEPARTMENT_OTHER): Payer: No Typology Code available for payment source

## 2009-06-11 DIAGNOSIS — R0989 Other specified symptoms and signs involving the circulatory and respiratory systems: Secondary | ICD-10-CM

## 2009-06-11 LAB — PR SONO PREG 2ND/3RD TRIMESTER

## 2009-06-12 DIAGNOSIS — R942 Abnormal results of pulmonary function studies: Secondary | ICD-10-CM

## 2009-06-13 ENCOUNTER — Ambulatory Visit (HOSPITAL_BASED_OUTPATIENT_CLINIC_OR_DEPARTMENT_OTHER): Payer: No Typology Code available for payment source

## 2009-06-13 DIAGNOSIS — O169 Unspecified maternal hypertension, unspecified trimester: Secondary | ICD-10-CM

## 2009-06-14 ENCOUNTER — Inpatient Hospital Stay: Payer: Self-pay

## 2009-06-16 ENCOUNTER — Encounter (HOSPITAL_BASED_OUTPATIENT_CLINIC_OR_DEPARTMENT_OTHER): Payer: No Typology Code available for payment source | Admitting: Clinical

## 2009-06-19 ENCOUNTER — Ambulatory Visit (HOSPITAL_BASED_OUTPATIENT_CLINIC_OR_DEPARTMENT_OTHER): Payer: No Typology Code available for payment source

## 2009-06-19 ENCOUNTER — Ambulatory Visit
Payer: No Typology Code available for payment source | Attending: Obstetrics/Gynecology | Admitting: Obstetrics & Gynecology

## 2009-06-19 ENCOUNTER — Ambulatory Visit (HOSPITAL_BASED_OUTPATIENT_CLINIC_OR_DEPARTMENT_OTHER): Payer: No Typology Code available for payment source | Admitting: Obstetrics Med

## 2009-06-19 DIAGNOSIS — I151 Hypertension secondary to other renal disorders: Secondary | ICD-10-CM | POA: Insufficient documentation

## 2009-06-19 DIAGNOSIS — O10419 Pre-existing secondary hypertension complicating pregnancy, unspecified trimester: Secondary | ICD-10-CM | POA: Insufficient documentation

## 2009-06-19 DIAGNOSIS — E1021 Type 1 diabetes mellitus with diabetic nephropathy: Secondary | ICD-10-CM | POA: Insufficient documentation

## 2009-06-19 DIAGNOSIS — R0989 Other specified symptoms and signs involving the circulatory and respiratory systems: Secondary | ICD-10-CM

## 2009-06-23 ENCOUNTER — Encounter (HOSPITAL_BASED_OUTPATIENT_CLINIC_OR_DEPARTMENT_OTHER): Payer: Self-pay | Admitting: Ophthalmology

## 2009-06-24 ENCOUNTER — Encounter (HOSPITAL_BASED_OUTPATIENT_CLINIC_OR_DEPARTMENT_OTHER): Payer: Self-pay

## 2009-06-24 ENCOUNTER — Encounter (HOSPITAL_BASED_OUTPATIENT_CLINIC_OR_DEPARTMENT_OTHER): Payer: No Typology Code available for payment source | Admitting: Obstetrics/Gynecology

## 2009-06-24 ENCOUNTER — Institutional Professional Consult (permissible substitution) (HOSPITAL_BASED_OUTPATIENT_CLINIC_OR_DEPARTMENT_OTHER): Payer: No Typology Code available for payment source | Admitting: Obstetrics/Gynecology

## 2009-06-24 ENCOUNTER — Encounter (HOSPITAL_BASED_OUTPATIENT_CLINIC_OR_DEPARTMENT_OTHER): Payer: No Typology Code available for payment source | Admitting: Obstetrics and Gynecology

## 2009-06-26 ENCOUNTER — Ambulatory Visit
Payer: No Typology Code available for payment source | Attending: Obstetrics/Gynecology | Admitting: Obstetrics & Gynecology

## 2009-06-26 ENCOUNTER — Ambulatory Visit (HOSPITAL_BASED_OUTPATIENT_CLINIC_OR_DEPARTMENT_OTHER): Payer: No Typology Code available for payment source

## 2009-06-26 ENCOUNTER — Ambulatory Visit (HOSPITAL_BASED_OUTPATIENT_CLINIC_OR_DEPARTMENT_OTHER): Payer: No Typology Code available for payment source | Admitting: Registered"

## 2009-06-26 ENCOUNTER — Ambulatory Visit (HOSPITAL_BASED_OUTPATIENT_CLINIC_OR_DEPARTMENT_OTHER): Payer: No Typology Code available for payment source | Admitting: Clinical

## 2009-06-26 ENCOUNTER — Ambulatory Visit (HOSPITAL_BASED_OUTPATIENT_CLINIC_OR_DEPARTMENT_OTHER): Payer: No Typology Code available for payment source | Admitting: Obstetrics Med

## 2009-06-26 ENCOUNTER — Ambulatory Visit (HOSPITAL_BASED_OUTPATIENT_CLINIC_OR_DEPARTMENT_OTHER): Payer: No Typology Code available for payment source | Admitting: Obstetrics/Gynecology

## 2009-06-26 DIAGNOSIS — O10019 Pre-existing essential hypertension complicating pregnancy, unspecified trimester: Secondary | ICD-10-CM

## 2009-06-26 DIAGNOSIS — Z331 Pregnant state, incidental: Secondary | ICD-10-CM | POA: Insufficient documentation

## 2009-06-26 DIAGNOSIS — E119 Type 2 diabetes mellitus without complications: Secondary | ICD-10-CM | POA: Insufficient documentation

## 2009-07-03 ENCOUNTER — Ambulatory Visit
Payer: No Typology Code available for payment source | Attending: Obstetrics & Gynecology | Admitting: Obstetrics & Gynecology

## 2009-07-03 DIAGNOSIS — O10019 Pre-existing essential hypertension complicating pregnancy, unspecified trimester: Secondary | ICD-10-CM | POA: Insufficient documentation

## 2009-07-03 DIAGNOSIS — E119 Type 2 diabetes mellitus without complications: Secondary | ICD-10-CM | POA: Insufficient documentation

## 2009-07-08 ENCOUNTER — Encounter (HOSPITAL_BASED_OUTPATIENT_CLINIC_OR_DEPARTMENT_OTHER): Payer: No Typology Code available for payment source | Admitting: Obstetrics and Gynecology

## 2009-07-08 ENCOUNTER — Institutional Professional Consult (permissible substitution) (HOSPITAL_BASED_OUTPATIENT_CLINIC_OR_DEPARTMENT_OTHER): Payer: No Typology Code available for payment source | Admitting: Obstetrics/Gynecology

## 2009-07-08 ENCOUNTER — Encounter (HOSPITAL_BASED_OUTPATIENT_CLINIC_OR_DEPARTMENT_OTHER): Payer: No Typology Code available for payment source

## 2009-07-08 ENCOUNTER — Encounter (HOSPITAL_BASED_OUTPATIENT_CLINIC_OR_DEPARTMENT_OTHER): Payer: Self-pay

## 2009-07-10 ENCOUNTER — Ambulatory Visit (HOSPITAL_BASED_OUTPATIENT_CLINIC_OR_DEPARTMENT_OTHER): Payer: No Typology Code available for payment source | Admitting: Obstetrics/Gynecology

## 2009-07-10 ENCOUNTER — Ambulatory Visit: Payer: No Typology Code available for payment source | Attending: Obstetrics/Gynecology

## 2009-07-10 ENCOUNTER — Ambulatory Visit (HOSPITAL_BASED_OUTPATIENT_CLINIC_OR_DEPARTMENT_OTHER): Payer: No Typology Code available for payment source

## 2009-07-10 ENCOUNTER — Ambulatory Visit (HOSPITAL_BASED_OUTPATIENT_CLINIC_OR_DEPARTMENT_OTHER): Payer: No Typology Code available for payment source | Admitting: Obstetrics Med

## 2009-07-10 ENCOUNTER — Other Ambulatory Visit: Payer: Self-pay | Admitting: Obstetrics/Gynecology

## 2009-07-10 ENCOUNTER — Ambulatory Visit (HOSPITAL_BASED_OUTPATIENT_CLINIC_OR_DEPARTMENT_OTHER): Payer: No Typology Code available for payment source | Admitting: Obstetrics & Gynecology

## 2009-07-10 DIAGNOSIS — O10019 Pre-existing essential hypertension complicating pregnancy, unspecified trimester: Secondary | ICD-10-CM

## 2009-07-10 DIAGNOSIS — O409XX0 Polyhydramnios, unspecified trimester, not applicable or unspecified: Secondary | ICD-10-CM

## 2009-07-10 DIAGNOSIS — E119 Type 2 diabetes mellitus without complications: Secondary | ICD-10-CM | POA: Insufficient documentation

## 2009-07-10 LAB — PR SONO PREGNANCY FU OR REPEAT

## 2009-07-22 ENCOUNTER — Ambulatory Visit
Payer: No Typology Code available for payment source | Attending: Obstetrics & Gynecology | Admitting: Obstetrics and Gynecology

## 2009-07-22 DIAGNOSIS — D509 Iron deficiency anemia, unspecified: Secondary | ICD-10-CM | POA: Insufficient documentation

## 2009-07-22 DIAGNOSIS — E119 Type 2 diabetes mellitus without complications: Secondary | ICD-10-CM | POA: Insufficient documentation

## 2009-07-22 DIAGNOSIS — O99810 Abnormal glucose complicating pregnancy: Secondary | ICD-10-CM

## 2009-07-22 DIAGNOSIS — O10019 Pre-existing essential hypertension complicating pregnancy, unspecified trimester: Secondary | ICD-10-CM | POA: Insufficient documentation

## 2009-07-24 ENCOUNTER — Institutional Professional Consult (permissible substitution) (HOSPITAL_BASED_OUTPATIENT_CLINIC_OR_DEPARTMENT_OTHER): Payer: No Typology Code available for payment source | Admitting: Obstetrics Med

## 2009-07-24 ENCOUNTER — Encounter (HOSPITAL_BASED_OUTPATIENT_CLINIC_OR_DEPARTMENT_OTHER): Payer: No Typology Code available for payment source

## 2009-07-24 ENCOUNTER — Other Ambulatory Visit (HOSPITAL_BASED_OUTPATIENT_CLINIC_OR_DEPARTMENT_OTHER): Payer: No Typology Code available for payment source | Admitting: Obstetrics and Gynecology

## 2009-07-31 ENCOUNTER — Ambulatory Visit (HOSPITAL_BASED_OUTPATIENT_CLINIC_OR_DEPARTMENT_OTHER): Payer: No Typology Code available for payment source | Admitting: Obstetrics/Gynecology

## 2009-07-31 ENCOUNTER — Ambulatory Visit (HOSPITAL_BASED_OUTPATIENT_CLINIC_OR_DEPARTMENT_OTHER): Payer: No Typology Code available for payment source

## 2009-07-31 ENCOUNTER — Ambulatory Visit (HOSPITAL_BASED_OUTPATIENT_CLINIC_OR_DEPARTMENT_OTHER): Payer: No Typology Code available for payment source | Admitting: Obstetrics Med

## 2009-07-31 ENCOUNTER — Ambulatory Visit (HOSPITAL_BASED_OUTPATIENT_CLINIC_OR_DEPARTMENT_OTHER): Payer: No Typology Code available for payment source | Admitting: Obstetrics & Gynecology

## 2009-07-31 ENCOUNTER — Other Ambulatory Visit (HOSPITAL_BASED_OUTPATIENT_CLINIC_OR_DEPARTMENT_OTHER): Payer: No Typology Code available for payment source | Admitting: Obstetrics and Gynecology

## 2009-07-31 ENCOUNTER — Ambulatory Visit (HOSPITAL_COMMUNITY): Payer: No Typology Code available for payment source | Admitting: Obstetrics/Gynecology

## 2009-07-31 ENCOUNTER — Ambulatory Visit
Admission: EM | Admit: 2009-07-31 | Discharge: 2009-07-31 | Disposition: A | Discharge status: Home / Self Care | Payer: No Typology Code available for payment source | Source: Ambulatory Visit | Attending: Obstetrics/Gynecology | Admitting: Obstetrics/Gynecology

## 2009-07-31 ENCOUNTER — Ambulatory Visit (HOSPITAL_BASED_OUTPATIENT_CLINIC_OR_DEPARTMENT_OTHER): Payer: No Typology Code available for payment source | Admitting: Obstetrics and Gynecology

## 2009-07-31 DIAGNOSIS — O10019 Pre-existing essential hypertension complicating pregnancy, unspecified trimester: Secondary | ICD-10-CM | POA: Insufficient documentation

## 2009-07-31 DIAGNOSIS — Z331 Pregnant state, incidental: Secondary | ICD-10-CM | POA: Insufficient documentation

## 2009-07-31 DIAGNOSIS — O99810 Abnormal glucose complicating pregnancy: Secondary | ICD-10-CM | POA: Insufficient documentation

## 2009-07-31 DIAGNOSIS — E119 Type 2 diabetes mellitus without complications: Secondary | ICD-10-CM | POA: Insufficient documentation

## 2009-08-05 ENCOUNTER — Ambulatory Visit (HOSPITAL_BASED_OUTPATIENT_CLINIC_OR_DEPARTMENT_OTHER): Payer: No Typology Code available for payment source

## 2009-08-05 ENCOUNTER — Ambulatory Visit (HOSPITAL_BASED_OUTPATIENT_CLINIC_OR_DEPARTMENT_OTHER): Payer: No Typology Code available for payment source | Admitting: Obstetrics and Gynecology

## 2009-08-05 ENCOUNTER — Inpatient Hospital Stay (HOSPITAL_COMMUNITY): Payer: No Typology Code available for payment source | Admitting: Obstetrics/Gynecology

## 2009-08-05 ENCOUNTER — Inpatient Hospital Stay
Admission: EM | Admit: 2009-08-05 | Discharge: 2009-08-17 | DRG: 650 | Disposition: A | Discharge status: Home / Self Care | Payer: No Typology Code available for payment source | Source: Ambulatory Visit | Attending: Obstetrics/Gynecology | Admitting: Obstetrics/Gynecology

## 2009-08-05 ENCOUNTER — Other Ambulatory Visit: Payer: Self-pay | Admitting: Obstetrics & Gynecology

## 2009-08-05 DIAGNOSIS — R011 Cardiac murmur, unspecified: Secondary | ICD-10-CM | POA: Diagnosis present

## 2009-08-05 DIAGNOSIS — O2390 Unspecified genitourinary tract infection in pregnancy, unspecified trimester: Secondary | ICD-10-CM | POA: Insufficient documentation

## 2009-08-05 DIAGNOSIS — O10019 Pre-existing essential hypertension complicating pregnancy, unspecified trimester: Secondary | ICD-10-CM

## 2009-08-05 DIAGNOSIS — D568 Other thalassemias: Secondary | ICD-10-CM | POA: Diagnosis present

## 2009-08-05 DIAGNOSIS — D509 Iron deficiency anemia, unspecified: Secondary | ICD-10-CM | POA: Insufficient documentation

## 2009-08-05 DIAGNOSIS — E119 Type 2 diabetes mellitus without complications: Secondary | ICD-10-CM | POA: Insufficient documentation

## 2009-08-05 DIAGNOSIS — IMO0002 Reserved for concepts with insufficient information to code with codable children: Principal | ICD-10-CM | POA: Diagnosis present

## 2009-08-05 DIAGNOSIS — I10 Essential (primary) hypertension: Secondary | ICD-10-CM | POA: Diagnosis present

## 2009-08-05 DIAGNOSIS — O321XX0 Maternal care for breech presentation, not applicable or unspecified: Secondary | ICD-10-CM | POA: Diagnosis present

## 2009-08-05 DIAGNOSIS — O99892 Other specified diseases and conditions complicating childbirth: Secondary | ICD-10-CM | POA: Diagnosis present

## 2009-08-05 DIAGNOSIS — O409XX0 Polyhydramnios, unspecified trimester, not applicable or unspecified: Secondary | ICD-10-CM | POA: Insufficient documentation

## 2009-08-06 DIAGNOSIS — E1029 Type 1 diabetes mellitus with other diabetic kidney complication: Secondary | ICD-10-CM

## 2009-08-06 DIAGNOSIS — O10019 Pre-existing essential hypertension complicating pregnancy, unspecified trimester: Secondary | ICD-10-CM

## 2009-08-06 DIAGNOSIS — O10919 Unspecified pre-existing hypertension complicating pregnancy, unspecified trimester: Secondary | ICD-10-CM

## 2009-08-06 LAB — PR SONO PREGNANCY FU OR REPEAT

## 2009-08-08 DIAGNOSIS — IMO0002 Reserved for concepts with insufficient information to code with codable children: Secondary | ICD-10-CM

## 2009-08-10 DIAGNOSIS — J984 Other disorders of lung: Secondary | ICD-10-CM

## 2009-08-10 DIAGNOSIS — IMO0002 Reserved for concepts with insufficient information to code with codable children: Secondary | ICD-10-CM

## 2009-08-11 ENCOUNTER — Other Ambulatory Visit (HOSPITAL_BASED_OUTPATIENT_CLINIC_OR_DEPARTMENT_OTHER): Payer: Self-pay | Admitting: Obstetrics/Gynecology

## 2009-08-11 DIAGNOSIS — IMO0002 Reserved for concepts with insufficient information to code with codable children: Secondary | ICD-10-CM

## 2009-08-11 DIAGNOSIS — O321XX0 Maternal care for breech presentation, not applicable or unspecified: Secondary | ICD-10-CM

## 2009-08-11 DIAGNOSIS — Z4889 Encounter for other specified surgical aftercare: Secondary | ICD-10-CM

## 2009-08-19 ENCOUNTER — Ambulatory Visit (HOSPITAL_BASED_OUTPATIENT_CLINIC_OR_DEPARTMENT_OTHER): Payer: No Typology Code available for payment source | Admitting: Clinical

## 2009-08-19 ENCOUNTER — Ambulatory Visit
Payer: No Typology Code available for payment source | Attending: Obstetrics & Gynecology | Admitting: Obstetrics and Gynecology

## 2009-08-19 DIAGNOSIS — IMO0001 Reserved for inherently not codable concepts without codable children: Secondary | ICD-10-CM | POA: Insufficient documentation

## 2009-08-19 DIAGNOSIS — Z331 Pregnant state, incidental: Secondary | ICD-10-CM | POA: Insufficient documentation

## 2009-08-19 LAB — PATHOLOGY, SURGICAL

## 2009-08-26 ENCOUNTER — Ambulatory Visit
Payer: No Typology Code available for payment source | Attending: Maternal & Fetal Medicine | Admitting: Obstetrics and Gynecology

## 2009-08-26 ENCOUNTER — Ambulatory Visit (HOSPITAL_BASED_OUTPATIENT_CLINIC_OR_DEPARTMENT_OTHER): Payer: No Typology Code available for payment source | Admitting: Obstetrics/Gynecology

## 2009-08-26 DIAGNOSIS — E119 Type 2 diabetes mellitus without complications: Secondary | ICD-10-CM | POA: Insufficient documentation

## 2009-08-26 DIAGNOSIS — IMO0002 Reserved for concepts with insufficient information to code with codable children: Secondary | ICD-10-CM | POA: Insufficient documentation

## 2009-09-08 ENCOUNTER — Ambulatory Visit (HOSPITAL_BASED_OUTPATIENT_CLINIC_OR_DEPARTMENT_OTHER): Payer: No Typology Code available for payment source | Admitting: Clinical

## 2009-09-08 ENCOUNTER — Ambulatory Visit
Payer: No Typology Code available for payment source | Attending: Obstetrics & Gynecology | Admitting: Obstetrics and Gynecology

## 2009-09-08 DIAGNOSIS — Z331 Pregnant state, incidental: Secondary | ICD-10-CM | POA: Insufficient documentation

## 2009-09-08 DIAGNOSIS — IMO0001 Reserved for inherently not codable concepts without codable children: Secondary | ICD-10-CM | POA: Insufficient documentation

## 2009-09-15 ENCOUNTER — Ambulatory Visit
Payer: No Typology Code available for payment source | Attending: Obstetrics & Gynecology | Admitting: Obstetrics and Gynecology

## 2009-09-15 DIAGNOSIS — Z30430 Encounter for insertion of intrauterine contraceptive device: Secondary | ICD-10-CM | POA: Insufficient documentation

## 2009-09-23 ENCOUNTER — Encounter (HOSPITAL_BASED_OUTPATIENT_CLINIC_OR_DEPARTMENT_OTHER): Payer: No Typology Code available for payment source | Admitting: Ophthalmology

## 2009-10-13 ENCOUNTER — Encounter (HOSPITAL_BASED_OUTPATIENT_CLINIC_OR_DEPARTMENT_OTHER): Payer: No Typology Code available for payment source | Admitting: Ophthalmology

## 2009-10-14 ENCOUNTER — Ambulatory Visit: Payer: No Typology Code available for payment source | Attending: Registered Nurse | Admitting: Registered Nurse

## 2009-10-14 DIAGNOSIS — E1039 Type 1 diabetes mellitus with other diabetic ophthalmic complication: Secondary | ICD-10-CM | POA: Insufficient documentation

## 2009-10-14 DIAGNOSIS — IMO0002 Reserved for concepts with insufficient information to code with codable children: Secondary | ICD-10-CM | POA: Insufficient documentation

## 2009-10-14 DIAGNOSIS — Z794 Long term (current) use of insulin: Secondary | ICD-10-CM | POA: Insufficient documentation

## 2009-10-14 DIAGNOSIS — I10 Essential (primary) hypertension: Secondary | ICD-10-CM | POA: Insufficient documentation

## 2009-10-14 DIAGNOSIS — Z713 Dietary counseling and surveillance: Secondary | ICD-10-CM | POA: Insufficient documentation

## 2009-10-14 DIAGNOSIS — E1049 Type 1 diabetes mellitus with other diabetic neurological complication: Secondary | ICD-10-CM | POA: Insufficient documentation

## 2009-11-25 ENCOUNTER — Emergency Department (HOSPITAL_COMMUNITY): Admission: EM | Admit: 2009-11-25 | Discharge: 2009-11-25 | Payer: Self-pay | Admitting: Emergency Medicine

## 2009-12-03 ENCOUNTER — Encounter (HOSPITAL_BASED_OUTPATIENT_CLINIC_OR_DEPARTMENT_OTHER): Payer: No Typology Code available for payment source | Admitting: Registered Nurse

## 2009-12-03 ENCOUNTER — Encounter (HOSPITAL_BASED_OUTPATIENT_CLINIC_OR_DEPARTMENT_OTHER): Payer: No Typology Code available for payment source | Admitting: Nutrition Med

## 2009-12-09 ENCOUNTER — Encounter (HOSPITAL_BASED_OUTPATIENT_CLINIC_OR_DEPARTMENT_OTHER): Payer: No Typology Code available for payment source | Admitting: Registered Nurse

## 2010-01-14 ENCOUNTER — Encounter (HOSPITAL_BASED_OUTPATIENT_CLINIC_OR_DEPARTMENT_OTHER): Payer: No Typology Code available for payment source | Admitting: Nutrition Med

## 2010-01-14 ENCOUNTER — Encounter (HOSPITAL_BASED_OUTPATIENT_CLINIC_OR_DEPARTMENT_OTHER): Payer: No Typology Code available for payment source | Admitting: Registered Nurse

## 2010-02-18 ENCOUNTER — Ambulatory Visit (HOSPITAL_BASED_OUTPATIENT_CLINIC_OR_DEPARTMENT_OTHER): Payer: No Typology Code available for payment source | Admitting: Registered Nurse

## 2010-02-18 ENCOUNTER — Ambulatory Visit: Payer: No Typology Code available for payment source | Attending: Registered Nurse | Admitting: Nutrition Med

## 2010-02-18 DIAGNOSIS — Z794 Long term (current) use of insulin: Secondary | ICD-10-CM | POA: Insufficient documentation

## 2010-02-18 DIAGNOSIS — I10 Essential (primary) hypertension: Secondary | ICD-10-CM | POA: Insufficient documentation

## 2010-02-18 DIAGNOSIS — IMO0002 Reserved for concepts with insufficient information to code with codable children: Secondary | ICD-10-CM | POA: Insufficient documentation

## 2010-02-18 DIAGNOSIS — Z713 Dietary counseling and surveillance: Secondary | ICD-10-CM | POA: Insufficient documentation

## 2010-03-18 ENCOUNTER — Encounter (HOSPITAL_BASED_OUTPATIENT_CLINIC_OR_DEPARTMENT_OTHER): Payer: No Typology Code available for payment source | Admitting: Nutrition Med

## 2010-04-15 ENCOUNTER — Encounter (HOSPITAL_BASED_OUTPATIENT_CLINIC_OR_DEPARTMENT_OTHER): Payer: No Typology Code available for payment source | Admitting: Registered Nurse

## 2010-04-22 ENCOUNTER — Ambulatory Visit: Payer: No Typology Code available for payment source | Attending: Registered Nurse | Admitting: Nutrition Med

## 2010-04-22 DIAGNOSIS — IMO0002 Reserved for concepts with insufficient information to code with codable children: Secondary | ICD-10-CM | POA: Insufficient documentation

## 2010-05-13 ENCOUNTER — Encounter (HOSPITAL_BASED_OUTPATIENT_CLINIC_OR_DEPARTMENT_OTHER): Payer: No Typology Code available for payment source | Admitting: Registered Nurse

## 2010-05-20 ENCOUNTER — Ambulatory Visit: Payer: No Typology Code available for payment source | Attending: Registered Nurse | Admitting: Registered Nurse

## 2010-05-20 DIAGNOSIS — IMO0002 Reserved for concepts with insufficient information to code with codable children: Secondary | ICD-10-CM | POA: Insufficient documentation

## 2010-05-20 DIAGNOSIS — Z713 Dietary counseling and surveillance: Secondary | ICD-10-CM | POA: Insufficient documentation

## 2010-05-20 DIAGNOSIS — E1049 Type 1 diabetes mellitus with other diabetic neurological complication: Secondary | ICD-10-CM | POA: Insufficient documentation

## 2010-05-20 DIAGNOSIS — Z794 Long term (current) use of insulin: Secondary | ICD-10-CM | POA: Insufficient documentation

## 2010-06-10 ENCOUNTER — Encounter (HOSPITAL_BASED_OUTPATIENT_CLINIC_OR_DEPARTMENT_OTHER): Payer: No Typology Code available for payment source | Admitting: Registered Nurse

## 2010-07-08 ENCOUNTER — Ambulatory Visit: Payer: No Typology Code available for payment source | Attending: Registered Nurse | Admitting: Registered Nurse

## 2010-07-08 DIAGNOSIS — E1029 Type 1 diabetes mellitus with other diabetic kidney complication: Secondary | ICD-10-CM | POA: Insufficient documentation

## 2010-07-08 DIAGNOSIS — Z713 Dietary counseling and surveillance: Secondary | ICD-10-CM | POA: Insufficient documentation

## 2010-07-08 DIAGNOSIS — Z794 Long term (current) use of insulin: Secondary | ICD-10-CM | POA: Insufficient documentation

## 2010-07-08 DIAGNOSIS — IMO0002 Reserved for concepts with insufficient information to code with codable children: Secondary | ICD-10-CM | POA: Insufficient documentation

## 2010-07-08 DIAGNOSIS — I10 Essential (primary) hypertension: Secondary | ICD-10-CM | POA: Insufficient documentation

## 2010-08-19 ENCOUNTER — Encounter (HOSPITAL_BASED_OUTPATIENT_CLINIC_OR_DEPARTMENT_OTHER): Payer: No Typology Code available for payment source | Admitting: Registered Nurse

## 2010-08-19 ENCOUNTER — Encounter (HOSPITAL_BASED_OUTPATIENT_CLINIC_OR_DEPARTMENT_OTHER): Payer: Self-pay | Admitting: Nutrition Med

## 2010-10-19 ENCOUNTER — Encounter (HOSPITAL_BASED_OUTPATIENT_CLINIC_OR_DEPARTMENT_OTHER): Payer: No Typology Code available for payment source | Admitting: Registered Nurse

## 2010-10-19 ENCOUNTER — Encounter (HOSPITAL_BASED_OUTPATIENT_CLINIC_OR_DEPARTMENT_OTHER): Payer: No Typology Code available for payment source | Admitting: Registered"

## 2010-11-20 ENCOUNTER — Encounter (HOSPITAL_BASED_OUTPATIENT_CLINIC_OR_DEPARTMENT_OTHER): Payer: No Typology Code available for payment source | Admitting: Obstetrics/Gynecology

## 2010-12-01 ENCOUNTER — Ambulatory Visit (HOSPITAL_BASED_OUTPATIENT_CLINIC_OR_DEPARTMENT_OTHER): Payer: No Typology Code available for payment source | Admitting: Nurse Practitioner

## 2010-12-14 ENCOUNTER — Ambulatory Visit (HOSPITAL_BASED_OUTPATIENT_CLINIC_OR_DEPARTMENT_OTHER): Payer: No Typology Code available for payment source | Admitting: Nurse Practitioner

## 2010-12-19 ENCOUNTER — Ambulatory Visit: Payer: Self-pay | Admitting: Family Medicine

## 2010-12-21 ENCOUNTER — Encounter (HOSPITAL_BASED_OUTPATIENT_CLINIC_OR_DEPARTMENT_OTHER): Payer: No Typology Code available for payment source | Admitting: Nurse Practitioner

## 2010-12-25 ENCOUNTER — Encounter (HOSPITAL_BASED_OUTPATIENT_CLINIC_OR_DEPARTMENT_OTHER): Payer: No Typology Code available for payment source | Admitting: Obstetrics/Gynecology

## 2010-12-31 ENCOUNTER — Ambulatory Visit: Payer: Self-pay | Admitting: Otolaryngology

## 2011-03-02 DIAGNOSIS — E1069 Type 1 diabetes mellitus with other specified complication: Secondary | ICD-10-CM | POA: Insufficient documentation

## 2011-03-02 DIAGNOSIS — E1065 Type 1 diabetes mellitus with hyperglycemia: Secondary | ICD-10-CM | POA: Insufficient documentation

## 2011-03-02 DIAGNOSIS — E785 Hyperlipidemia, unspecified: Secondary | ICD-10-CM | POA: Insufficient documentation

## 2011-03-02 DIAGNOSIS — E870 Hyperosmolality and hypernatremia: Secondary | ICD-10-CM | POA: Insufficient documentation

## 2011-03-02 DIAGNOSIS — I10 Essential (primary) hypertension: Secondary | ICD-10-CM | POA: Insufficient documentation

## 2011-04-08 ENCOUNTER — Other Ambulatory Visit (HOSPITAL_BASED_OUTPATIENT_CLINIC_OR_DEPARTMENT_OTHER): Payer: Self-pay | Admitting: Nephrology

## 2011-05-20 DIAGNOSIS — N186 End stage renal disease: Secondary | ICD-10-CM | POA: Insufficient documentation

## 2011-11-01 ENCOUNTER — Ambulatory Visit: Payer: Self-pay

## 2011-11-17 ENCOUNTER — Encounter (HOSPITAL_BASED_OUTPATIENT_CLINIC_OR_DEPARTMENT_OTHER): Payer: No Typology Code available for payment source | Admitting: Nephrology

## 2011-11-24 ENCOUNTER — Encounter (HOSPITAL_BASED_OUTPATIENT_CLINIC_OR_DEPARTMENT_OTHER): Payer: No Typology Code available for payment source | Admitting: Internal Medicine

## 2011-12-29 ENCOUNTER — Ambulatory Visit: Payer: Self-pay | Admitting: Family Medicine

## 2011-12-29 LAB — CBC WITH DIFFERENTIAL/PLATELET
Basophil #: 0.1 10*3/uL (ref 0.0–0.1)
HCT: 40.4 % (ref 35.0–47.0)
Lymphocyte #: 1.6 10*3/uL (ref 1.0–3.6)
MCH: 29.8 pg (ref 26.0–34.0)
MCHC: 33 g/dL (ref 32.0–36.0)
MCV: 90 fL (ref 80–100)
Monocyte #: 0.5 x10 3/mm (ref 0.2–0.9)
Monocyte %: 4.9 %
Neutrophil %: 77.6 %
RBC: 4.49 10*6/uL (ref 3.80–5.20)

## 2012-01-05 ENCOUNTER — Encounter (HOSPITAL_BASED_OUTPATIENT_CLINIC_OR_DEPARTMENT_OTHER): Payer: No Typology Code available for payment source | Admitting: Nephrology

## 2012-01-12 ENCOUNTER — Encounter (HOSPITAL_BASED_OUTPATIENT_CLINIC_OR_DEPARTMENT_OTHER): Payer: No Typology Code available for payment source | Admitting: Nephrology

## 2012-01-13 ENCOUNTER — Ambulatory Visit: Payer: Medicare Other | Attending: Nephrology | Admitting: Nephrology

## 2012-01-13 ENCOUNTER — Ambulatory Visit (HOSPITAL_BASED_OUTPATIENT_CLINIC_OR_DEPARTMENT_OTHER): Payer: No Typology Code available for payment source | Admitting: Nephrology

## 2012-01-13 DIAGNOSIS — N186 End stage renal disease: Secondary | ICD-10-CM | POA: Insufficient documentation

## 2012-01-26 ENCOUNTER — Encounter (HOSPITAL_BASED_OUTPATIENT_CLINIC_OR_DEPARTMENT_OTHER): Payer: No Typology Code available for payment source | Admitting: Internal Medicine

## 2012-03-02 ENCOUNTER — Other Ambulatory Visit (HOSPITAL_BASED_OUTPATIENT_CLINIC_OR_DEPARTMENT_OTHER): Payer: Self-pay | Admitting: Nephrology

## 2012-03-02 ENCOUNTER — Ambulatory Visit: Payer: Medicare Other | Attending: Nephrology

## 2012-03-02 ENCOUNTER — Ambulatory Visit (HOSPITAL_BASED_OUTPATIENT_CLINIC_OR_DEPARTMENT_OTHER): Payer: Medicare Other

## 2012-03-02 DIAGNOSIS — N186 End stage renal disease: Secondary | ICD-10-CM

## 2012-03-02 DIAGNOSIS — Z01818 Encounter for other preprocedural examination: Secondary | ICD-10-CM

## 2012-03-02 DIAGNOSIS — I10 Essential (primary) hypertension: Secondary | ICD-10-CM

## 2012-03-02 DIAGNOSIS — K7689 Other specified diseases of liver: Secondary | ICD-10-CM

## 2012-03-02 DIAGNOSIS — E1129 Type 2 diabetes mellitus with other diabetic kidney complication: Secondary | ICD-10-CM

## 2012-03-02 DIAGNOSIS — Z01810 Encounter for preprocedural cardiovascular examination: Secondary | ICD-10-CM

## 2012-03-02 LAB — PR US EXAM, ABDOM, COMPLETE

## 2012-03-02 LAB — PR NM CARDIAC STRESS MYOVIEW

## 2012-03-02 LAB — X-RAY CHEST 2 VW

## 2012-03-23 ENCOUNTER — Other Ambulatory Visit (HOSPITAL_BASED_OUTPATIENT_CLINIC_OR_DEPARTMENT_OTHER): Payer: Medicare Other

## 2012-03-29 ENCOUNTER — Other Ambulatory Visit (HOSPITAL_BASED_OUTPATIENT_CLINIC_OR_DEPARTMENT_OTHER): Payer: Self-pay

## 2012-03-29 ENCOUNTER — Other Ambulatory Visit (HOSPITAL_BASED_OUTPATIENT_CLINIC_OR_DEPARTMENT_OTHER): Payer: Self-pay | Admitting: Nephrology

## 2012-03-29 ENCOUNTER — Ambulatory Visit: Payer: Medicare Other | Attending: Nephrology

## 2012-03-29 DIAGNOSIS — K7689 Other specified diseases of liver: Secondary | ICD-10-CM

## 2012-03-29 LAB — PR CT SCAN OF ABDOMEN COMBO

## 2012-03-30 LAB — CREATININE BY I_STAT (POC), ~~LOC~~: Creatinine (POC): 8.7 mg/dL — ABNORMAL HIGH (ref 0.38–1.02)

## 2012-04-05 ENCOUNTER — Ambulatory Visit: Payer: Self-pay | Admitting: Family Medicine

## 2012-04-05 LAB — COMPREHENSIVE METABOLIC PANEL
Alkaline Phosphatase: 116 U/L (ref 50–136)
BUN: 12 mg/dL (ref 7–18)
Calcium, Total: 9.2 mg/dL (ref 8.5–10.1)
Chloride: 104 mmol/L (ref 98–107)
Co2: 26 mmol/L (ref 21–32)
EGFR (African American): >60
EGFR (Non-African Amer.): >60
Glucose: 100 mg/dL — ABNORMAL HIGH (ref 65–99)
Osmolality: 277 (ref 275–301)
SGPT (ALT): 40 U/L (ref 12–78)

## 2012-04-05 LAB — CBC WITH DIFFERENTIAL/PLATELET
Basophil %: 1.1 %
Eosinophil #: 0.6 10*3/uL (ref 0.0–0.7)
Eosinophil %: 5.2 %
HGB: 13.4 g/dL (ref 12.0–16.0)
Monocyte #: 0.6 x10 3/mm (ref 0.2–0.9)
Monocyte %: 5.5 %
Neutrophil #: 6.6 10*3/uL — ABNORMAL HIGH (ref 1.4–6.5)
Neutrophil %: 59.9 %
Platelet: 224 10*3/uL (ref 150–440)
RBC: 4.51 10*6/uL (ref 3.80–5.20)
WBC: 11 10*3/uL (ref 3.6–11.0)

## 2012-04-05 LAB — URINALYSIS, COMPLETE
Glucose,UR: NEGATIVE mg/dL (ref 0–75)
Ketone: NEGATIVE
Ph: 7 (ref 4.5–8.0)

## 2012-04-25 ENCOUNTER — Ambulatory Visit: Payer: Medicare Other | Attending: Surgical Oncology | Admitting: Surgical Oncology

## 2012-04-25 ENCOUNTER — Encounter (HOSPITAL_BASED_OUTPATIENT_CLINIC_OR_DEPARTMENT_OTHER): Payer: Medicare Other | Admitting: Surgical Oncology

## 2012-04-25 DIAGNOSIS — D135 Benign neoplasm of extrahepatic bile ducts: Secondary | ICD-10-CM | POA: Insufficient documentation

## 2012-04-25 DIAGNOSIS — D134 Benign neoplasm of liver: Secondary | ICD-10-CM | POA: Insufficient documentation

## 2012-05-22 ENCOUNTER — Encounter (HOSPITAL_BASED_OUTPATIENT_CLINIC_OR_DEPARTMENT_OTHER): Payer: Self-pay | Admitting: Registered"

## 2012-05-22 ENCOUNTER — Encounter (HOSPITAL_BASED_OUTPATIENT_CLINIC_OR_DEPARTMENT_OTHER): Payer: Medicare Other

## 2012-05-22 ENCOUNTER — Encounter (HOSPITAL_BASED_OUTPATIENT_CLINIC_OR_DEPARTMENT_OTHER): Payer: Medicare Other | Admitting: Transplant

## 2012-05-23 ENCOUNTER — Ambulatory Visit (HOSPITAL_BASED_OUTPATIENT_CLINIC_OR_DEPARTMENT_OTHER): Payer: Medicare Other | Admitting: Registered"

## 2012-05-23 ENCOUNTER — Ambulatory Visit (HOSPITAL_BASED_OUTPATIENT_CLINIC_OR_DEPARTMENT_OTHER): Payer: Medicare Other

## 2012-05-23 ENCOUNTER — Ambulatory Visit: Payer: Medicare Other | Attending: Nephrology | Admitting: Transplant

## 2012-05-23 DIAGNOSIS — N186 End stage renal disease: Secondary | ICD-10-CM | POA: Insufficient documentation

## 2012-05-23 DIAGNOSIS — E1029 Type 1 diabetes mellitus with other diabetic kidney complication: Secondary | ICD-10-CM | POA: Insufficient documentation

## 2012-05-30 ENCOUNTER — Other Ambulatory Visit (HOSPITAL_BASED_OUTPATIENT_CLINIC_OR_DEPARTMENT_OTHER): Payer: Self-pay

## 2012-05-30 ENCOUNTER — Ambulatory Visit (HOSPITAL_BASED_OUTPATIENT_CLINIC_OR_DEPARTMENT_OTHER): Payer: Medicare Other

## 2012-05-30 ENCOUNTER — Ambulatory Visit: Payer: Medicare Other | Attending: Radiation Oncology | Admitting: Transplant

## 2012-05-30 DIAGNOSIS — N058 Unspecified nephritic syndrome with other morphologic changes: Secondary | ICD-10-CM | POA: Insufficient documentation

## 2012-05-30 DIAGNOSIS — N186 End stage renal disease: Secondary | ICD-10-CM | POA: Insufficient documentation

## 2012-05-30 LAB — AMYLASE, FRACTIONS (SAL/PANC)
Amylase (Pancreatic): 30 U/L (ref 15–45)
Amylase, Salivary: 103 U/L — ABNORMAL HIGH (ref 12–72)

## 2012-05-30 LAB — LIPID PANEL
Cholesterol (LDL): 170 mg/dL — ABNORMAL HIGH (ref <130)
Cholesterol/HDL Ratio: 3.9
HDL Cholesterol: 66 mg/dL (ref >40)
Non-HDL Cholesterol: 192 mg/dL — ABNORMAL HIGH (ref 0–159)
Total Cholesterol: 258 mg/dL — ABNORMAL HIGH (ref <200)
Triglyceride: 111 mg/dL (ref <150)

## 2012-05-30 LAB — GAMMA GLUTAMYL TRANSFERASE: Gamma Glutamyl Transferase: 43 U/L (ref 0–55)

## 2012-05-30 LAB — CBC (HEMOGRAM)
Hematocrit: 39 % (ref 36–45)
Hemoglobin: 13.4 g/dL (ref 11.5–15.5)
MCH: 31.4 pg (ref 27.3–33.6)
MCHC: 34.1 g/dL (ref 32.2–36.5)
MCV: 92 fL (ref 81–98)
Platelet Count: 257 10*3/uL (ref 150–400)
RBC: 4.27 mil/uL (ref 3.80–5.00)
RDW-CV: 13.5 % (ref 11.6–14.4)
WBC: 11.45 10*3/uL — ABNORMAL HIGH (ref 4.3–10.0)

## 2012-05-30 LAB — COMPREHENSIVE METABOLIC PANEL
ALT (GPT): 15 U/L (ref 6–40)
AST (GOT): 18 U/L (ref 15–40)
Albumin: 3.9 g/dL (ref 3.5–5.2)
Albumin: 3.9 g/dL (ref 3.5–5.2)
Alkaline Phosphatase (Total): 89 U/L (ref 25–100)
Anion Gap: 11 (ref 3–11)
Anion Gap: 11 (ref 3–11)
Bilirubin (Total): 0.6 mg/dL (ref 0.2–1.3)
Calcium: 8.7 mg/dL — ABNORMAL LOW (ref 8.9–10.2)
Calcium: 8.7 mg/dL — ABNORMAL LOW (ref 8.9–10.2)
Carbon Dioxide, Total: 28 mEq/L (ref 22–32)
Carbon Dioxide, Total: 28 mEq/L (ref 22–32)
Chloride: 93 mEq/L — ABNORMAL LOW (ref 98–108)
Chloride: 93 mEq/L — ABNORMAL LOW (ref 98–108)
Creatinine: 7.27 mg/dL — ABNORMAL HIGH (ref 0.38–1.02)
Creatinine: 7.27 mg/dL — ABNORMAL HIGH (ref 0.38–1.02)
GFR, Calc, African American: 8 mL/min — ABNORMAL LOW (ref >59)
GFR, Calc, African American: 8 mL/min — ABNORMAL LOW (ref >59)
GFR, Calc, European American: 7 mL/min — ABNORMAL LOW (ref >59)
GFR, Calc, European American: 7 mL/min — ABNORMAL LOW (ref >59)
Glucose: 209 mg/dL — ABNORMAL HIGH (ref 62–125)
Glucose: 209 mg/dL — ABNORMAL HIGH (ref 62–125)
Potassium: 4.6 mEq/L (ref 3.7–5.2)
Potassium: 4.6 mEq/L (ref 3.7–5.2)
Protein (Total): 7.5 g/dL (ref 6.0–8.2)
Protein (Total): 7.5 g/dL (ref 6.0–8.2)
Sodium: 132 mEq/L — ABNORMAL LOW (ref 136–145)
Sodium: 132 mEq/L — ABNORMAL LOW (ref 136–145)
Urea Nitrogen: 29 mg/dL — ABNORMAL HIGH (ref 8–21)
Urea Nitrogen: 29 mg/dL — ABNORMAL HIGH (ref 8–21)

## 2012-05-30 LAB — VZV IMMUNE STATUS BY IFA

## 2012-05-30 LAB — PROTHROMBIN & PTT
Partial Thromboplastin Time: 28 s (ref 22–35)
Prothrombin INR: 1 (ref 0.8–1.3)
Prothrombin Time Patient: 13.3 s (ref 10.7–15.6)

## 2012-05-30 LAB — HERPES TYPE 1 & 2 SEROLOGY

## 2012-05-30 LAB — CMV IMMUNE STATUS BY EIA

## 2012-05-30 LAB — PSBC TEST REQUESTED:: PSBC Test Requested:: 5

## 2012-05-30 LAB — LIPASE: Lipase: 31 U/L (ref 20–50)

## 2012-05-30 LAB — MUMPS IMMUNE STATUS BY EIA

## 2012-05-30 LAB — 1ST EXTRA RED TOP

## 2012-05-30 LAB — PHOSPHATE: Phosphate: 7.9 mg/dL — ABNORMAL HIGH (ref 2.5–4.5)

## 2012-05-30 LAB — AMYLASE: Amylase Total: 133 U/L — ABNORMAL HIGH (ref 27–106)

## 2012-05-30 LAB — MAGNESIUM: Magnesium: 2.4 mg/dL (ref 1.8–2.4)

## 2012-05-30 LAB — THYROID STIMULATING HORMONE: Thyroid Stimulating Hormone: 0.991 u[IU]/mL (ref 0.400–5.000)

## 2012-05-30 LAB — PARATHYROID HORMONE: Parathyroid Hormone: 531 pg/mL — ABNORMAL HIGH (ref 12–88)

## 2012-05-31 LAB — ANTI TOXOPLASMA, IGG, IGM
Anti Toxoplasma, IgG Interp: NONREACTIVE
Anti Toxoplasma, IgG Result: <3.2 IU/mL (ref 0.0–7.4)
Anti Toxoplasma, IgM Interp: NONREACTIVE

## 2012-05-31 LAB — HEPATITIS A,B,& C PANEL
Hepatitis A Ab: NONREACTIVE
Hepatitis A Ab: NONREACTIVE
Hepatitis A Ab: NONREACTIVE
Hepatitis B Core Ab: NONREACTIVE
Hepatitis B Core Ab: NONREACTIVE
Hepatitis B Surf Antibody Intl Units: 467.1 IU
Hepatitis B Surf Antibody Intl Units: 467.1 IU
Hepatitis B Surf Antibody Intl Units: 467.1 IU
Hepatitis B Surf Antibody Intl Units: 467.1 IU
Hepatitis B Surf Antibody Intl Units: 467.1 IU
Hepatitis B Surf Antibody Intl Units: 467.1 IU
Hepatitis B Surface Ab: REACTIVE
Hepatitis B Surface Ab: REACTIVE
Hepatitis B Surface Ab: REACTIVE
Hepatitis B Surface Ab: REACTIVE
Hepatitis B Surface Ab: REACTIVE
Hepatitis B Surface Ab: REACTIVE
Hepatitis B Surface Antigen w/Reflex: NONREACTIVE
Hepatitis B Surface Antigen w/Reflex: NONREACTIVE
Hepatitis B Surface Antigen w/Reflex: NONREACTIVE
Hepatitis B Surface Antigen w/Reflex: NONREACTIVE
Hepatitis B Surface Antigen w/Reflex: NONREACTIVE
Hepatitis C Antibody w/Rflx PCR: NONREACTIVE
Hepatitis C Antibody w/Rflx PCR: NONREACTIVE
Hepatitis C Antibody w/Rflx PCR: NONREACTIVE
Hepatitis C Antibody w/Rflx PCR: NONREACTIVE

## 2012-05-31 LAB — CMV IMMUNE STATUS BY EIA: CMV Immune Status Result: POSITIVE

## 2012-05-31 LAB — SEROLOGIC SYPHILIS PANEL, SRM
Syphilis Igg Ab Result: NEGATIVE
Syphilis Screening Status: NEGATIVE

## 2012-05-31 LAB — VZV IMMUNE STATUS BY IFA: Varicella Zoster Immune Status Result: >1:8 {titer}

## 2012-05-31 LAB — HEMOGLOBIN A1C, HPLC: Hemoglobin A1C: 8 % — ABNORMAL HIGH (ref 4.0–6.0)

## 2012-05-31 LAB — HIV ANTIGEN AND ANTIBODY SCRN
HIV Antigen and Antibody Interpretation: NONREACTIVE
HIV Antigen and Antibody Result: NONREACTIVE

## 2012-06-02 LAB — QUANTIFERON TB TEST
Quantiferon TB Ag Result: 0.03 IU gamma IF/mL
Quantiferon TB Interpretation: NEGATIVE
Quantiferon TB Interpretation: NEGATIVE
Quantiferon TB MIT Result: 3.48 IU gamma IF/mL
Quantiferon TB MIT Result: 3.48 IU gamma IF/mL
Quantiferon TB Nil Result: 0.04 IU gamma IF/mL

## 2012-06-02 LAB — MUMPS IMMUNE STATUS BY EIA: Mumps Immune Status Result: POSITIVE

## 2012-06-02 LAB — RUBELLA IMMUNE STATUS BY EIA
Rubella Antibody Level: >20 IU
Rubella Immune Status Result: POSITIVE

## 2012-06-02 LAB — RUBEOLA IMMUNE STATUS BY EIA: Rubeola Immune Status Result: POSITIVE

## 2012-06-04 LAB — EBV SEROLOGY
EBV IgG Antibody: POSITIVE
EBV IgM Antibody: NEGATIVE
EBV Nuclear Antibody: POSITIVE

## 2012-06-06 LAB — C_PEPTIDE: C Peptide: 0.5 ng/mL — ABNORMAL LOW (ref 1.0–7.1)

## 2012-06-07 LAB — HERPES TYPE 1 & 2 SEROLOGY: HSV Western Blot Result: POSITIVE

## 2012-11-08 ENCOUNTER — Ambulatory Visit: Payer: Self-pay | Admitting: Emergency Medicine

## 2013-01-19 ENCOUNTER — Emergency Department: Payer: Self-pay | Admitting: Emergency Medicine

## 2013-01-20 LAB — URINALYSIS, COMPLETE
Bacteria: NONE SEEN
Bilirubin,UR: NEGATIVE
Blood: NEGATIVE
Glucose,UR: NEGATIVE mg/dL (ref 0–75)
Ketone: NEGATIVE
Nitrite: NEGATIVE
Ph: 5 (ref 4.5–8.0)
Protein: NEGATIVE
WBC UR: 10 /HPF (ref 0–5)

## 2013-01-20 LAB — CBC WITH DIFFERENTIAL/PLATELET
Basophil #: 0 10*3/uL (ref 0.0–0.1)
Eosinophil #: 0.3 10*3/uL (ref 0.0–0.7)
HCT: 39.2 % (ref 35.0–47.0)
HGB: 12.3 g/dL (ref 12.0–16.0)
MCH: 27.2 pg (ref 26.0–34.0)
MCV: 87 fL (ref 80–100)
Monocyte #: 0.6 x10 3/mm (ref 0.2–0.9)
Monocyte %: 6.1 %
Neutrophil %: 50.1 %
RBC: 4.53 10*6/uL (ref 3.80–5.20)
RDW: 14.6 % — ABNORMAL HIGH (ref 11.5–14.5)
WBC: 9.3 10*3/uL (ref 3.6–11.0)

## 2013-01-20 LAB — COMPREHENSIVE METABOLIC PANEL
Albumin: 3.9 g/dL (ref 3.4–5.0)
Alkaline Phosphatase: 142 U/L — ABNORMAL HIGH (ref 50–136)
Anion Gap: 5 — ABNORMAL LOW (ref 7–16)
BUN: 14 mg/dL (ref 7–18)
Bilirubin,Total: 0.3 mg/dL (ref 0.2–1.0)
Calcium, Total: 8.9 mg/dL (ref 8.5–10.1)
Creatinine: 0.85 mg/dL (ref 0.60–1.30)
EGFR (African American): >60
Glucose: 105 mg/dL — ABNORMAL HIGH (ref 65–99)
SGOT(AST): 24 U/L (ref 15–37)
SGPT (ALT): 32 U/L (ref 12–78)

## 2013-02-20 ENCOUNTER — Telehealth (HOSPITAL_BASED_OUTPATIENT_CLINIC_OR_DEPARTMENT_OTHER): Payer: Self-pay | Admitting: Internal Medicine

## 2013-02-20 DIAGNOSIS — N186 End stage renal disease: Secondary | ICD-10-CM

## 2013-02-20 NOTE — Telephone Encounter (Signed)
Placing orders for abdominal MRI/Vascular Duplex

## 2013-02-23 ENCOUNTER — Encounter (HOSPITAL_BASED_OUTPATIENT_CLINIC_OR_DEPARTMENT_OTHER): Payer: Self-pay | Admitting: Internal Medicine

## 2013-03-23 ENCOUNTER — Other Ambulatory Visit (HOSPITAL_BASED_OUTPATIENT_CLINIC_OR_DEPARTMENT_OTHER): Payer: Self-pay | Admitting: Internal Medicine

## 2013-03-23 ENCOUNTER — Other Ambulatory Visit (HOSPITAL_BASED_OUTPATIENT_CLINIC_OR_DEPARTMENT_OTHER): Payer: Medicare Other

## 2013-03-23 ENCOUNTER — Ambulatory Visit (HOSPITAL_BASED_OUTPATIENT_CLINIC_OR_DEPARTMENT_OTHER): Payer: Medicare Other

## 2013-03-23 ENCOUNTER — Ambulatory Visit: Payer: Medicare Other | Attending: Internal Medicine

## 2013-03-23 DIAGNOSIS — N186 End stage renal disease: Secondary | ICD-10-CM

## 2013-03-23 DIAGNOSIS — Z01818 Encounter for other preprocedural examination: Secondary | ICD-10-CM | POA: Insufficient documentation

## 2013-03-26 LAB — PR US LOW EXT ARTERIAL UNILAT

## 2013-03-26 LAB — PR US UPPER OR LOWER EXT SNGL

## 2013-03-26 LAB — PR US AORTA ILIAC ART/VEIN COMP

## 2013-03-26 LAB — PR US CAROTID DOPPLER

## 2013-04-03 ENCOUNTER — Telehealth (HOSPITAL_BASED_OUTPATIENT_CLINIC_OR_DEPARTMENT_OTHER): Payer: Self-pay | Admitting: Transplant Surgery

## 2013-04-03 DIAGNOSIS — N186 End stage renal disease: Secondary | ICD-10-CM

## 2013-04-03 NOTE — Telephone Encounter (Signed)
Placing order for Abdominal CT, 4 phase liver CT  follow up on lesions in the liver, as requested on 05/30/12 surgeon note

## 2013-04-20 ENCOUNTER — Ambulatory Visit: Payer: Medicare Other | Attending: Transplant Surgery

## 2013-04-20 DIAGNOSIS — K769 Liver disease, unspecified: Secondary | ICD-10-CM | POA: Insufficient documentation

## 2013-09-20 ENCOUNTER — Other Ambulatory Visit (HOSPITAL_BASED_OUTPATIENT_CLINIC_OR_DEPARTMENT_OTHER): Payer: Self-pay | Admitting: Internal Medicine

## 2013-09-20 DIAGNOSIS — N186 End stage renal disease: Secondary | ICD-10-CM

## 2013-09-25 ENCOUNTER — Ambulatory Visit: Payer: Medicare Other | Attending: Internal Medicine | Admitting: Unknown Physician Specialty

## 2013-09-25 ENCOUNTER — Other Ambulatory Visit (HOSPITAL_BASED_OUTPATIENT_CLINIC_OR_DEPARTMENT_OTHER): Payer: Self-pay

## 2013-09-25 ENCOUNTER — Other Ambulatory Visit (HOSPITAL_BASED_OUTPATIENT_CLINIC_OR_DEPARTMENT_OTHER): Payer: Self-pay | Admitting: Internal Medicine

## 2013-09-25 DIAGNOSIS — N186 End stage renal disease: Secondary | ICD-10-CM

## 2013-09-25 DIAGNOSIS — Z23 Encounter for immunization: Secondary | ICD-10-CM | POA: Insufficient documentation

## 2013-09-25 NOTE — Progress Notes (Signed)
Vaccine Screening Questions: [facility]        1. I feel sick today.   NO    1. There is no evidence that acute illness reduces vaccine efficacy or increases vaccine adverse events. However, with moderate or severe illness, all vaccines should be delayed until the illness has improved. Mild illness (e.g., upperrespiratory infection or diarrhea) are NOT contraindications to vaccination. Do not withhold vaccination if a person is taking antibiotic.    2. Are you allergic to eggs, baker's yeast, gelatin, streptomycin, neomycin, formaldehyde,merthiolate / thimerosal? (Please circle all that apply)  NO    2. Please see Vaccine Excipient Table below at end of form.**    3. Are you currently taking "blood thinning medications" such as Warfarin (Coumadin), Enoxaparin (Lovenox, Dalteparin(Fragmin), Fondiparinux(Arixtra), Aspirin, Clopidogrel(Plavix), Prasugrel(Effient), Dipyridamol(Persantine), Ticagrelor(Brilinta), Cilostazol(Pletal), Aggrenox, Rivaroxaban(Xarelto), Dabigatran(Xarelto)  YES:  Aspirin    3.  Patients who are currently receiving these medications need to advise their providers so that we may take specific precautions when giving intramuscular vaccines. Administer using a 23-gauge (or smaller) needle and apply pressure to  the vaccine site for 2 minutes after administration. DO NOT RUB the vaccine site. Patients should be counseled on monitoring for signs and symptoms of hematoma formation at the site of the injection.    4. Do you have allergies to medications, food, or any vaccine?   If yes, please list:@[email or web address]@  NO    4. History of anaphylactic reaction such as hives, wheezing or difficulty breathing, or circulatory collapse or shock (not fainting) from a previous dose of vaccine or vaccine component is a contraindication for further doses. For example, if a person  experiences anaphylaxis after eating eggs, do not administer influenza vaccine, or if a person has anaphylaxis  after eating gelatin, do not administer MMR or varicella vaccine. Local reactions (e.g., a red eye following instillation of ophthalmic solution) are not contraindications. Please see Vaccine Excipient Table enclosed on order form.     5. Have you ever had a severe reaction to any vaccine? If yes, please describe:  NO    5.  History of anaphylactic reaction to a previous dose of vaccine or vaccine component is a contraindication for subsequent  doses. Under normal circumstances, vaccines are deferred when a precaution is present. However, situations may arise  when the benefit outweighs the risk (e.g., community measles outbreak).    6. Are you currently undergoing radiation treatments?  NO    6.  If patient is undergoing radiation therapy, inactivated vaccine is no problem. For live virus vaccines contact patient's PCP prior to administration.    7. Have you had Immune Globulin (Gamma Globulin) or received blood or blood products in the past seven months?  NO    7. Live virus vaccines may need to be deferred, depending on several variables. Please see the ACIP Statement "General Recommendations on Immunization" on page 1 of the PINK BOOK and APPENDIX A for suggested intervals between administration of blood products and live virus vaccines.    8. Have you ever had Guillian-Barre Syndrome, a condition that causes paralysis?  NO    8. If the patient has had this condition, hold the vaccination and call the MD.    9.  Are you pregnant or planning a pregnancy in the next three months?  NO    9. Live virus vaccines are contraindicated prior to and during pregnancy due to the possible risk of virus transmission to the fetus. Instruct patients who are   sexually active in their childbearing years to practice careful contraception for one month  after receiving the MMR, rubella, varicella, or intranasal influenza vaccination.    10. Have you received any vaccinations in the past 4 weeks?  NO    10. You may either administer  two live vaccines on the same day OR separate them by 28 days. Live vaccines must replicate in order for the immune system to mount a response. Separating live vaccine administrations allows for less interference of  viral replication. On the other hand, inactivated vaccines may be given together or at any spacing interval.    11. Are you allergic to latex products? (If yes, please describe reaction):  NO      11. If the patient has a true latex allergy, call the inpatient pharmacy [phone]) and they will provide a latex-free immunization.          Adapted from "Understanding the Screening Questionnaire for Adult Immunization", [employer], [email or web address].   Updated June 2014    **VACCINE EXCIPIENT AND MEDIA SUMMARY  Excipient Use Vaccine    Egg protein: Influenza (all brands)  Formaldehyde, formalin:  DTaP (all brands), Td (all brands), Hepatitis A (Havrix, Vaqta), Hepatitis A-hepatitis B (Twinrix), Influenza (Fluogen, FluShield, Fluzone)  Gelatin: Influenza (Fluzone), Measles (Attenuvax), Mumps (Mumpsvax),  Rubella (Meruvax II), MMR (MMRII), Typhoid oral (Vivotif), Varicella (Varivax)  Neomycin:  Measles (Attenuvax), Mumps (Mumpsvax), Rubella (Meruvax II),  MMR (MMR-II), Rabies (Imovax), Influenza (Fluvirin)  Phenol: Pneumococcal polysaccharide (Pneumovax 23)  Streptomycin: Influenza (Fluogen)  Thimerosal: Influenza (all brands except the intranasal vaccine), Pneumococcal  polysaccharide (Pneu-Immune 23), Meningococcal (Menomune)  Yeast: Hepatitis B (Engerix-B, Recombivax-HB)    Adapted from "Vaccine Excipient & Media Summary", ImmunoFacts, Facts and Comparisons,  February 2001 and Dewart, Jew RK. Addressing parents' concerns: do vaccines contain harmful  preservatives, adjuvants, additives, or residuals? Pediatrics.2003;112(6);1394-7.  Updated June 2014

## 2013-10-01 ENCOUNTER — Encounter (HOSPITAL_BASED_OUTPATIENT_CLINIC_OR_DEPARTMENT_OTHER): Payer: Self-pay | Admitting: Internal Medicine

## 2013-10-01 ENCOUNTER — Other Ambulatory Visit (HOSPITAL_BASED_OUTPATIENT_CLINIC_OR_DEPARTMENT_OTHER): Payer: Self-pay | Admitting: Internal Medicine

## 2013-10-26 ENCOUNTER — Other Ambulatory Visit (HOSPITAL_COMMUNITY): Payer: Self-pay

## 2013-10-26 ENCOUNTER — Ambulatory Visit (HOSPITAL_BASED_OUTPATIENT_CLINIC_OR_DEPARTMENT_OTHER): Payer: Medicare Other

## 2013-10-26 ENCOUNTER — Ambulatory Visit: Payer: Medicare Other | Attending: Internal Medicine

## 2013-10-26 DIAGNOSIS — J984 Other disorders of lung: Secondary | ICD-10-CM

## 2013-10-26 DIAGNOSIS — E1129 Type 2 diabetes mellitus with other diabetic kidney complication: Secondary | ICD-10-CM | POA: Insufficient documentation

## 2013-10-26 DIAGNOSIS — I10 Essential (primary) hypertension: Secondary | ICD-10-CM

## 2013-10-26 DIAGNOSIS — N186 End stage renal disease: Secondary | ICD-10-CM | POA: Insufficient documentation

## 2013-10-26 DIAGNOSIS — Z01818 Encounter for other preprocedural examination: Secondary | ICD-10-CM

## 2013-12-31 ENCOUNTER — Encounter (HOSPITAL_BASED_OUTPATIENT_CLINIC_OR_DEPARTMENT_OTHER): Payer: Self-pay

## 2013-12-31 ENCOUNTER — Ambulatory Visit (HOSPITAL_BASED_OUTPATIENT_CLINIC_OR_DEPARTMENT_OTHER): Payer: Medicare Other | Admitting: Registered Nurse

## 2013-12-31 ENCOUNTER — Ambulatory Visit: Payer: Medicare Other | Attending: Internal Medicine | Admitting: Surgery

## 2013-12-31 ENCOUNTER — Other Ambulatory Visit (HOSPITAL_BASED_OUTPATIENT_CLINIC_OR_DEPARTMENT_OTHER): Payer: Self-pay

## 2013-12-31 ENCOUNTER — Encounter (HOSPITAL_BASED_OUTPATIENT_CLINIC_OR_DEPARTMENT_OTHER): Payer: Self-pay | Admitting: Registered Nurse

## 2013-12-31 VITALS — BP 190/96 | HR 73 | Temp 97.9°F | Ht 65.87 in | Wt 160.7 lb

## 2013-12-31 DIAGNOSIS — Z01818 Encounter for other preprocedural examination: Secondary | ICD-10-CM | POA: Insufficient documentation

## 2013-12-31 DIAGNOSIS — N186 End stage renal disease: Secondary | ICD-10-CM

## 2013-12-31 DIAGNOSIS — E1069 Type 1 diabetes mellitus with other specified complication: Secondary | ICD-10-CM | POA: Insufficient documentation

## 2013-12-31 DIAGNOSIS — E1029 Type 1 diabetes mellitus with other diabetic kidney complication: Secondary | ICD-10-CM

## 2013-12-31 DIAGNOSIS — E1065 Type 1 diabetes mellitus with hyperglycemia: Secondary | ICD-10-CM | POA: Insufficient documentation

## 2013-12-31 DIAGNOSIS — E109 Type 1 diabetes mellitus without complications: Secondary | ICD-10-CM

## 2013-12-31 DIAGNOSIS — E870 Hyperosmolality and hypernatremia: Secondary | ICD-10-CM | POA: Insufficient documentation

## 2013-12-31 DIAGNOSIS — I10 Essential (primary) hypertension: Secondary | ICD-10-CM

## 2013-12-31 LAB — KIDNEY TRX, BASIC PANEL 1
Anion Gap: 8 (ref 4–12)
Calcium: 8.3 mg/dL — ABNORMAL LOW (ref 8.9–10.2)
Carbon Dioxide, Total: 29 mEq/L (ref 22–32)
Chloride: 95 mEq/L — ABNORMAL LOW (ref 98–108)
Creatinine: 11.67 mg/dL — ABNORMAL HIGH (ref 0.38–1.02)
GFR, Calc, African American: 5 mL/min — ABNORMAL LOW (ref >59)
GFR, Calc, European American: 4 mL/min — ABNORMAL LOW (ref >59)
Glucose: 274 mg/dL — ABNORMAL HIGH (ref 62–125)
Hematocrit: 30 % — ABNORMAL LOW (ref 36–45)
Hemoglobin: 9.6 g/dL — ABNORMAL LOW (ref 11.5–15.5)
MCH: 30.4 pg (ref 27.3–33.6)
MCHC: 32.3 g/dL (ref 32.2–36.5)
MCV: 94 fL (ref 81–98)
Platelet Count: 216 10*3/uL (ref 150–400)
Potassium: 6.4 mEq/L (ref 3.6–5.2)
RBC: 3.16 mil/uL — ABNORMAL LOW (ref 3.80–5.00)
RDW-CV: 14.7 % — ABNORMAL HIGH (ref 11.6–14.4)
Sodium: 132 mEq/L — ABNORMAL LOW (ref 135–145)
Urea Nitrogen: 41 mg/dL — ABNORMAL HIGH (ref 8–21)
WBC: 8.81 10*3/uL (ref 4.3–10.0)

## 2013-12-31 LAB — HEPATIC FUNCTION PANEL
ALT (GPT): 18 U/L (ref 7–33)
AST (GOT): 14 U/L (ref 9–38)
Albumin: 4.5 g/dL (ref 3.5–5.2)
Alkaline Phosphatase (Total): 109 U/L — ABNORMAL HIGH (ref 25–100)
Bilirubin (Direct): 0.1 mg/dL (ref 0.0–0.3)
Bilirubin (Total): 0.4 mg/dL (ref 0.2–1.3)
Protein (Total): 7.3 g/dL (ref 6.0–8.2)

## 2013-12-31 LAB — MAGNESIUM: Magnesium: 2.4 mg/dL (ref 1.8–2.4)

## 2013-12-31 LAB — PHOSPHATE: Phosphate: 4.7 mg/dL — ABNORMAL HIGH (ref 2.5–4.5)

## 2013-12-31 NOTE — Progress Notes (Signed)
Transplant Nephrology - Return Visit Pretransplant Evaluation Clinic  Encounter Date: 12/31/2013  Accompanied By: This patient is unaccompanied.  Primary nephrologist: Laney Potash     Chief Complaint:     Audrey Bullock presents to the [facility] for a revisit regarding her candidacy for kidney/pancreas transplantation, currently approved pending completion of her workup. She began dialysis on 04/06/11.     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 She also has a history of hypertension.     In the interim since Audrey Bullock was last seen in the [facility], she denies any hospitalizations or significant illness. She denies any recent changes in her medical history or known family medical history. She was seen by the liver tumor clinic on 04/25/12 for multiple small liver lesions incidentally noted on workup which are likely benign. She was cleared for transplantation with plans to repeat imaging in 6 months. 4 phase liver CT on 04/20/13 showed stable liver lesions.    Ms. Swallows endorses a kidney diet - she mostly eats at home. She walks on her lunch breaks 5 days a week for exercise. She reports that she can walk half an hour without stopping. Exertion is limited by back pain, no DOE or chest pain. She states that her at-home blood pressures are typically 150s/70s - 190s/90s and at-home blood sugars are typically 130-200.    Review of Systems -   She denies fevers, chills, cough, chest pain, palpitations, shortness of breath, claudication, edema, depression, anxiety, or recent travel.  Otherwise negative except for that noted in the HPI.    Renal history:   Dialysis start date & Modality: 04/06/11; HD 5d/week  Problems on dialysis: No. She states her P and K are under control  Kidney stones: No  Recurrent urinary tract infections: No    Diabetes: Type 1 for 16 years  with the following complications:    Retinopathy: Unsure, she has difficulty seeing at night  Neuropathy: No  Gastropathy: Rare nausea  Hypoglycemic unawareness: No  Nonhealing lower extremity ulcers: No   Diabetes control: Insulin Pump  HgbA1c: Yes, 7.3    Past Medical History  1) ESRD  2) Type 1 DM dx 11  3) Hypertension dx 23  4) Liver lesions, adenomas vs. [place]    She denies a history of cancer, myocardial infarction, heart failure, cerebrovascular accident, seizures, lung disease, peptic ulcers, hepatitis, pancreatitis, gallstones, blood clots. Hx of 1 blood transfusion.    Pregnancies: G 2 P 1    Past Surgical History   Procedure Laterality Date   . Vascular access surgery     . Cesarean delivery only  2011       Social History  She denies smoking, alcohol or illicit drug use.  She denies any changes in post-transplant caregiver status. She states that her mother and her sister will be her caregivers, they are not working now.    Family History  Family History   Problem Relation Age of Onset   . No Known Problems Mother    . No Known Problems Father    . Diabetes Maternal Grandmother    . Renal Disease Maternal Grandmother      She denies a family history of cardiovascular disease or cancer.    Current Outpatient Prescriptions   Medication Sig Dispense Refill   . AmLODIPine Besylate  5 MG Oral Tab Take 5 mg by mouth.     . B Complex-C-Folic Acid (NEPHROCAPS) 1 MG Oral Cap      . Calcitriol 0.5 MCG Oral Cap      . Calcium Acetate 667 MG Oral Cap Take 2,668 mg by mouth.     . Insulin Lispro, Human, (HUMALOG) 100 UNIT/ML Subcutaneous Solution inject 6-10 units subcutaneously before meals if needed     . Oxycodone-Acetaminophen (ROXICET) 5-325 MG Oral Tab Take by mouth daily as needed.       No current facility-administered medications for this visit.     Medication Notes:   I have reviewed the above list of medicines with the patient and it is accurate.    BP 190/96  Pulse 73  Temp(Src) 97.9 F (36.6 C)  (Temporal)  Ht 5' 5.87" (1.673 m)  Wt 160 lb 11.5 oz (72.9 kg)  BMI 26.05 kg/m2  SpO2 98%  Estimated body mass index is 26.05 kg/(m^2) as calculated from the following:    Height as of this encounter: 5' 5.87" (1.673 m).    Weight as of this encounter: 160 lb 11.5 oz (72.9 kg).     Physical Exam:   Vitals: As above  General: 28 year old African American woman in no acute distress.  HEENT: Anicteric sclerae, moist mucosa without erythema or exudate. Dentition without infection noted.  Neck: Supple, without lymphadenopathy. L carotid bruit or transmitted fistula bruit. No R carotid bruit.  Lungs: Clear to auscultation, without wheeze or rales.  Cardiovascular: Regular rate and rhythm, without gallop or rub. 2/6 systolic murmur or transmitted fistula bruit.  Abdominal: Soft, nontender, and nondistended with normal active bowel sounds. No hepatosplenomegaly noted. Insulin pump in place.  Extremities: No clubbing, cyanosis. No edema bilaterally. Femoral pulses 2+ and without bruits. Radial and pedal pulses are 2+. No lower extremity discoloration or atrophic changes noted. There is a left upper extremity AV fistula that is intact with a normal bruit and thrill.   Neurologic: Grossly normal, without tremor. Alert & oriented x4.  Skin: Normal without rash. No lower extremity ulceration, although there is a small scab between her 1st and 2nd toes on the left.     RESULTS REVIEWED.  Orders Only on 12/31/13   KIDNEY TRX, BASIC PANEL 1       Result Value Ref Range    Sodium 132 (*) 135 - 145 mEq/L    Potassium    3.6 - 5.2 mEq/L    Chloride 95 (*) 98 - 108 mEq/L    Carbon Dioxide, Total 29  22 - 32 mEq/L    Anion Gap 8  4 - 12    Glucose 274 (*) 62 - 125 mg/dL    Urea Nitrogen 41 (*) 8 - 21 mg/dL    Creatinine 11.67 (*) 0.38 - 1.02 mg/dL    Calcium 8.3 (*) 8.9 - 10.2 mg/dL    Gfr, Calc, European American 4 (*) >59 mL/min    GFR, Calc, African American 5 (*) >59 mL/min    GFR, Information        Value: Calculated GFR in  mL/min/1.73 m2 by MDRD equation.  Inaccurate with changing renal function.  See [email or web address]    WBC 8.81  4.3 - 10.0 THOU/uL    RBC 3.16 (*) 3.80 - 5.00 mil/uL    Hemoglobin 9.6 (*) 11.5 - 15.5 g/dL    Hematocrit 30 (*) 36 - 45 %    MCV 94  81 -  98 fL    MCH 30.4  27.3 - 33.6 pg    MCHC 32.3  32.2 - 36.5 g/dL    Platelet Count 216  150 - 400 THOU/uL    RDW-CV 14.7 (*) 11.6 - 14.4 %   MAGNESIUM       Result Value Ref Range    Magnesium 2.4  1.8 - 2.4 mg/dL   PHOSPHATE       Result Value Ref Range    Phosphate 4.7 (*) 2.5 - 4.5 mg/dL   HEPATITIS A,B,& C PANEL       Result Value Ref Range    Hepatitis B_S Ag w/Rflx PCR    NREAC    Hepatitis B Surface Ab        Hepatitis B Surf Ab Intl Units        Hepatitis B Core Ab    NREAC    Hepatitis A Ab        Hepatitis A Ab Ig Class        Hepatitis A and B Interpretation        Hepatitis C Ab w/Rflx PCR    NREAC    Hepatitis C Ab Interpretation       HEPATIC FUNCTION PANEL       Result Value Ref Range    Albumin 4.5  3.5 - 5.2 g/dL    Protein (Total) 7.3  6.0 - 8.2 g/dL    Bilirubin (Total) 0.4  0.2 - 1.3 mg/dL    Bilirubin (Direct) 0.1  0.0 - 0.3 mg/dL    Alkaline Phosphatase (Total) 109 (*) 25 - 100 U/L    AST (GOT) 14  9 - 38 U/L    ALT (GPT) 18  7 - 33 U/L       Myocardial Perfusion Scan: Yes, 10/26/13 no scintigraphic evidence of myocardial ischemia or infarction. The left ventricular cavity size is mildly enlarged.  Echocardiogram: Yes, 10/26/13 Moderate concentric left ventricular hypertrophy with normal volumes and normal systolic function, EF [id number]; Moderate pulmonary hypertension with estimated pressures of 49 to 54 mmHg.  Carotid Doppler: Yes, 03/23/13 0-15% bilaterally  Lower Extremity Doppler: Yes, 03/23/13 No significant stenosis  CT Abdomen: Yes,   -4 phase liver CT 04/20/13 1. Stable segment 4 hepatic lesion near the IVC. Rest of the arterial enhancing lesions seen on the previous study are not well visualized, likely due to  difference in phases. These lesions likely represent adenomas /FNH. A followup Eovist enhanced MRI is suggested to differentiate between the two. 2. The two hypodense subcentimeter hepatic lesion seen on the previous study also stable and likely represent hemangiomas. 3. Mild atherosclerotic calcifications noted involving bilateral renal arteries, splenic artery and the SMA. No aneurysmal dilatation or stenosis. -- Per Dr. Dellia Cloud, the MRI would be useful to differentiate adenomas from [facility] but is not necessary to clear for transplant.  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  Abdominal Ultrasound: Yes, 03/02/12  Renal Ultrasound 03/30/2011 wnl  Cancer Screening: Yes, Pap smear 03/01/13 negative  Dental Clearance: Yes, 04/27/13  PRA:  0% 05/30/12; B+   Vaccinations: Hepatitis A 09/25/13, Hepatitis Bab+, prevnar 05/30/12, Tdap 05/30/12  Serologies: 05/30/12 CMV +, EBV +, HCV Ab -, HBsAg -, HBsAb +, HBcAb -, HAV Ab -, Quantiferon -  Chest X-ray: 10/26/13 Compared to 03/02/2012, there is new mild diffuse lung disease with increase in cardiac silhouette size, representing fluid overload or mild pulmonary edema.  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 She has an adequate understanding of the transplantation process and is appropriately motivated. She seems to have a good care plan in place, although her caretakers are not available today. I encouraged her to bring them in the future. She denies any concern about taking transplant medications as prescribed.    We discussed the risks and benefits of kidney transplantation, including surgical and immunosuppression risks, cardiovascular events or even death and Ms. Lunde wishes to proceed further with the evaluation process. Surgical risks include cardiovascular, peripheral vascular,  pulmonary and neurologic complications. Immunosuppression risks include rejection, infection, malignancy, diabetes and complications, weight gain and neurologic complications.     The benefits of renal transplantation over maintenance dialysis were explained. She does wish to consider CDC high risk donors.    Earney Mallet Dotzler's case will be discussed by the Transplant team in our weekly transplant meeting and a decision regarding candidacy, further workup and listing will be made at that time. I will recommend Audrey Bullock as being a likely reasonable candidate for kidney/pancreas transplantation.    From a cardiovascular pre-transplant evaluation perspective I recommend improved blood pressure control and likely further evaluation with pulmonology for pulmonary hypertension.   From a malignancy screening perspective she is up to date.   From an infectious screening and risk evaluation perspective I recommend our routine serological labs and vaccinations.    [place]  Transplant Nephrology  Division of Nephrology  [facility]  [place], [place]

## 2014-01-01 LAB — HEPATITIS A,B,& C PANEL
Hepatitis A Ab: REACTIVE
Hepatitis B Core Ab: NONREACTIVE
Hepatitis B Surf Antibody Intl Units: 69.47 IU
Hepatitis B Surface Ab: REACTIVE
Hepatitis B Surface Antigen w/Reflex: NONREACTIVE
Hepatitis C Antibody w/Rflx PCR: NONREACTIVE

## 2014-01-01 LAB — HEMOGLOBIN A1C, HPLC: Hemoglobin A1C: 7.9 % — ABNORMAL HIGH (ref 4.0–6.0)

## 2014-01-02 NOTE — Progress Notes (Addendum)
Audrey Bullock, Audrey Bullock          [id number]          01/02/2014      TRANSPLANT SURGERY NEW PATIENT VISIT       IDENTIFYING DATA     Audrey Bullock is a 28 year old woman with end-stage renal disease secondary to type 1 diabetes, here today to discuss kidney-pancreas transplant.  She does home hemodialysis and started dialysis in October 2012.  She produces very little urine, voiding 1 or 2 times a day.  She does not have a history of kidney stones, no hematuria, no dysuria, no urinary tract infections, no urinary incontinence.  She has had type 1 diabetes since she was 28 years old.  She has retinopathy and has poor night vision.  She does not have neuropathy.  She has had a blood transfusion.  Her medical history is negative for MI and CVA, negative for bleeding disorder, negative for blood clot, negative for pulmonary or liver disease.  Negative for cancer.  She has had 1 pregnancy.    SURGICAL HISTORY     1. C-section.  2. Central line for dialysis; subsequent removal.  3. Left upper extremity AV fistula.  4. Bilateral cataract surgeries, 2008.    FAMILY HISTORY     Her mother has hypertension.  She has a grandmother with diabetes.    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  She enjoys her work.  She is a life-long nonsmoker, does not drink alcohol or use recreational drugs.  She lives with her boyfriend.  She has a 82-year-old son.    MEDICATIONS     1. Amlodipine 5 mg PO daily.  2. Nephrocaps 1 capsule daily.  3. Calcitriol 0.5 micrograms PO daily.  4. Calcium acetate 2668 mg daily.  5. Humalog insulin subcutaneously 6 to 10 units sliding scale.  6. Oxycodone/acetaminophen 5/325 mg PO daily as needed for pain.  7. Insulin pump.    REVIEW OF SYSTEMS     She reports having a fair energy level.  She walks to and from her lunch at work, 3 to 4 blocks each day.  She does not get other exercise.  She reports having a fair appetite and her weight has been stable.  She  gets migraine headaches multiple times per week.  She sometimes gets nausea. Negative for dizziness, chest pain, shortness of breath.  Negative for constipation or diarrhea.  She produces almost no urine.  Negative for leg pain with activity.    PHYSICAL EXAMINATION     GENERAL APPEARANCE:  A reasonably well-appearing woman in no acute distress.  She is alert and oriented and in good spirits.  VITAL SIGNS:  Temperature 36.6, heart rate 73, blood pressure 190/96, oxygen saturation 98% on room air.  Height 167.3 cm, weight 72.9 kg,  BMI 26.  HEENT:  Normocephalic, atraumatic.  Sclerae anicteric.  Oropharynx - Mucous membranes moist, intact; no erythema or exudate.  NECK:  Supple, without carotid bruit.  CHEST:  Lungs are clear.  CARDIAC:  Regular rate and rhythm, with 2/6 murmur.  ABDOMEN:  Soft, nontender.  Positive bowel sounds.  No palpable masses.  Well-healed C-section incision.  Femoral pulses are 2+ bilaterally.  EXTREMITIES:  No lower extremity edema.  Dorsalis pedis pulses 2+ bilaterally.    TRANSPLANT EVALUATION     Echocardiogram, 10/26/2013 - LVEF 70%.  No regional wall motion abnormalities are seen.  LV  posterior wall thickness is moderately increased.  Global left ventricular wall thickness is moderately increased.  Moderate pulmonary hypertension with an estimated pulmonary artery pressure of 49 to 54 mmHg.  Pulmonary artery pressures may be underestimated secondary to incomplete signal.  No pericardial effusion is seen.  Normal valve structure and function.    Stress test, 10/26/2013 - No scintigraphic evidence of myocardial ischemia or infarction.    Carotid and lower extremity arterial duplex, 03/23/2013 - Less than 15% diameter reduction of the bilateral internal carotid arteries.  All other evaluated arteries, including common carotid, external carotid, vertebral artery, and subclavian arteries bilaterally, are normal.  Lower extremity arterial evaluation - The ankle-brachial indices are normal  bilaterally.  Abdominal aorta is normal.  No evidence of stenosis or aneurysm.  The right and left common and external iliac arteries are patent.  The bilateral common femoral, profundus femoral less, and proximal superficial femoral arteries are patent.  The tibial arteries bilaterally are patent.    CT abdomen and pelvis with contrast, 04/20/2013 - Stable segment 4 hepatic lesion near the IVC.  The rest of the arterial enhancing lesions seen on the previous study are not well visualized, likely due to difference in phases.  These lesions likely represent adenomas/FNH.  Follow-up Eovist enhanced MRI is suggested to differentiate between the 2.  The 2 hypodense subcentimeter hepatic lesions seen on the previous study are also stable and likely represent hemangiomas.  Mild atherosclerotic calcifications are noted involving the bilateral renal arteries, splenic artery, and the SMA.  No aneurysmal dilatation or stenosis.    05/30/2012  - CPRA is 0.  Blood type is B positive.    ASSESSMENT AND PLAN     A 28 year old woman with end-stage renal disease secondary to type 1 diabetes, here today to discuss kidney-pancreas transplant.  Today, I reviewed potential complications of major surgery, including anesthesia complications, including potential for MI, CVA, or even death, among other issues.  I reviewed potential complications of kidney and pancreas transplant, including potential for blood vessel stenosis or thrombosis, potential for reoperation, potential for nonfunction or delayed function of the kidney or the pancreas graft, among other issues.  I reviewed the potential for complications specifically related to the pancreas transplant, including potential for injury to bowel, graft pancreatitis, and bowel obstruction.  We reviewed potential for wound healing complications, wound hernia, etc.  I also reviewed potential complications of immunosuppressive medication regimen, including the need for strict adherence to  the regimen, the increased risk of infection and cancer, and side effects.  I reviewed donor allocation issues and donor acceptance criteria.    She was seen and discussed today with Dr. Carleene Mains.  In order to complete her transplant evaluation, Dr. Carleene Mains would like information from the [facility] regarding whether her fluid volume status is optimal in the context of her pulmonary hypertension.    Her case will be discussed at our Multidisciplinary Transplant [place].    TIME STATEMENT     I spent 55 minutes with the patient, and the majority of the time was spent in discussing issues related to kidney and pancreas transplantation, issues related to immunosuppressive medication regimen, discussing donor allocation issues and donor acceptance criteria.  The time that I spent is in addition to the time spent by Dr. Carleene Mains.     I evaluated her personally and agree with the findings and the plans mentioned above by [employer] was made in discussion with me.  I reviewed all  the labs and studies and reiterated the surgeon's talk regarding the transplant procedure, perioperative and long term complications and donor related issues.  She seems to understand the issues.  We will present in the selection committee.  I spent 30 mins in addition to the time spent by Minh Roanhorse, and more than 50% spent in counseling and coordiantion of care.

## 2014-01-07 ENCOUNTER — Encounter (HOSPITAL_BASED_OUTPATIENT_CLINIC_OR_DEPARTMENT_OTHER): Payer: Self-pay

## 2014-01-08 ENCOUNTER — Encounter (HOSPITAL_BASED_OUTPATIENT_CLINIC_OR_DEPARTMENT_OTHER): Payer: Self-pay | Admitting: Surgery

## 2014-01-08 DIAGNOSIS — Z01818 Encounter for other preprocedural examination: Secondary | ICD-10-CM | POA: Insufficient documentation

## 2014-01-09 LAB — HLA AB DETECTION (SENDOUT)

## 2014-01-15 ENCOUNTER — Other Ambulatory Visit (HOSPITAL_BASED_OUTPATIENT_CLINIC_OR_DEPARTMENT_OTHER): Payer: Self-pay | Admitting: Internal Medicine

## 2014-01-15 DIAGNOSIS — N186 End stage renal disease: Secondary | ICD-10-CM

## 2014-02-01 ENCOUNTER — Ambulatory Visit: Payer: Medicare Other | Attending: Cardiovascular Disease

## 2014-02-01 ENCOUNTER — Other Ambulatory Visit (HOSPITAL_COMMUNITY): Payer: Self-pay

## 2014-02-01 DIAGNOSIS — N186 End stage renal disease: Secondary | ICD-10-CM | POA: Insufficient documentation

## 2014-02-01 DIAGNOSIS — Z01818 Encounter for other preprocedural examination: Secondary | ICD-10-CM | POA: Insufficient documentation

## 2014-03-04 ENCOUNTER — Other Ambulatory Visit (HOSPITAL_BASED_OUTPATIENT_CLINIC_OR_DEPARTMENT_OTHER): Payer: Medicare Other

## 2014-03-06 IMAGING — CT CT HEAD WITHOUT CONTRAST
2 series · 16 of 30 positions shown, 20 images · non-contrast
Comparison: none

REASON FOR EXAM: headache
COMMENTS:   LMP: Post Partum

PROCEDURE:     CT  - CT HEAD WITHOUT CONTRAST  - January 20, 2013 [DATE]
RESULT:     Comparison:  None
TECHNIQUE: Multiple axial images from the foramen magnum to the vertex were
obtained without IV contrast.

[Series 2: without · axial · non-contrast · 0.41mm/px · z∈[-102,+18]mm · 13 of 30 slices shown, 17 images]
[im 3/30  brain]
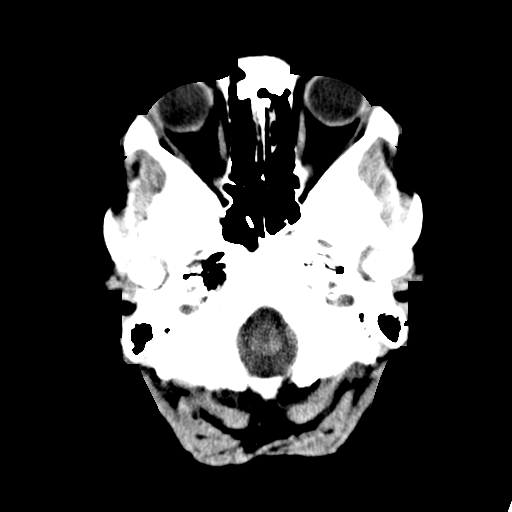
[im 3/30  bone]
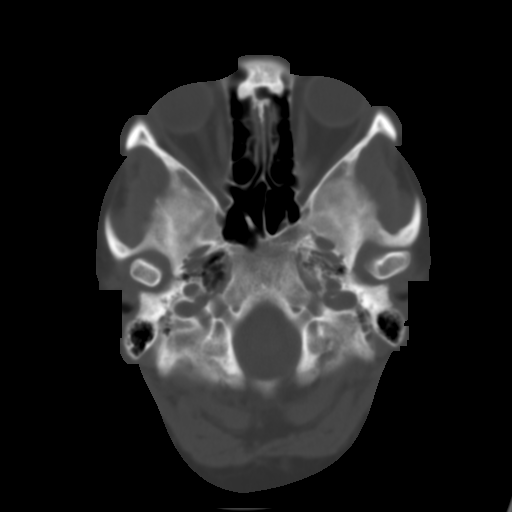
[im 5/30  brain]
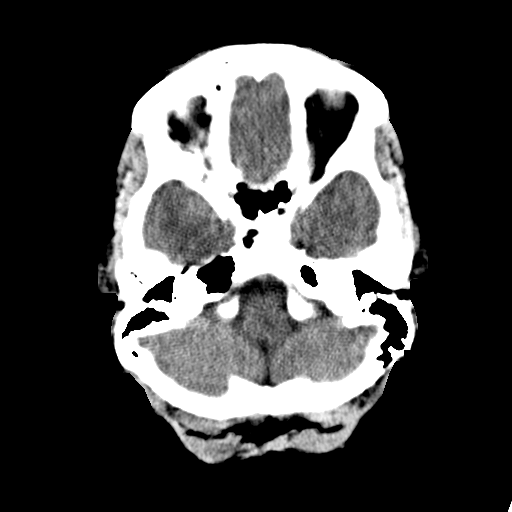
[im 7/30  brain]
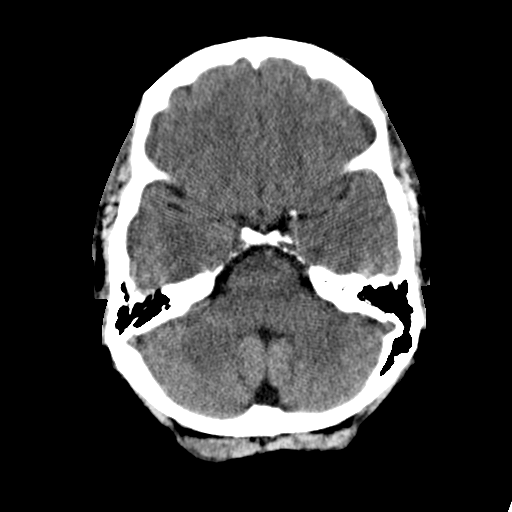
[im 9/30  brain]
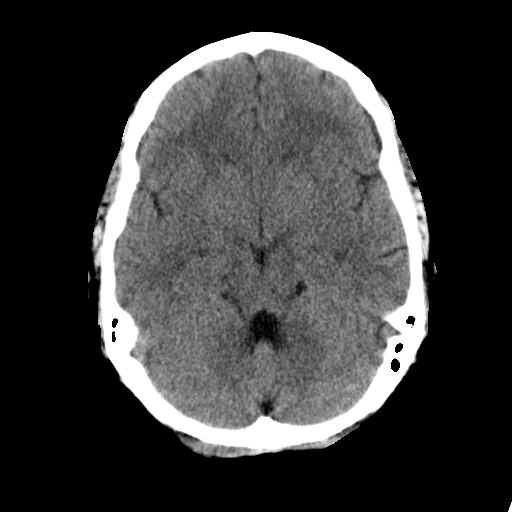
[im 11/30  brain]
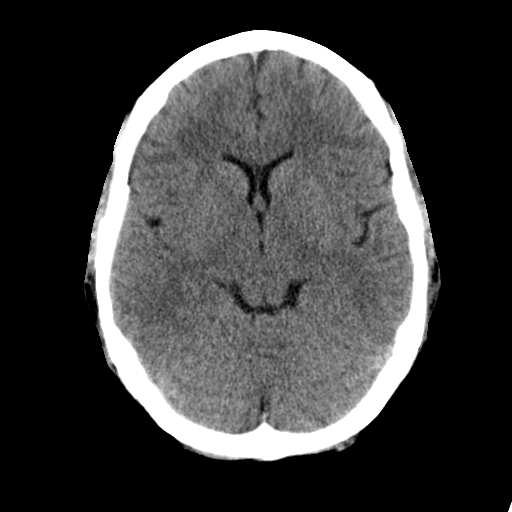
[im 11/30  bone]
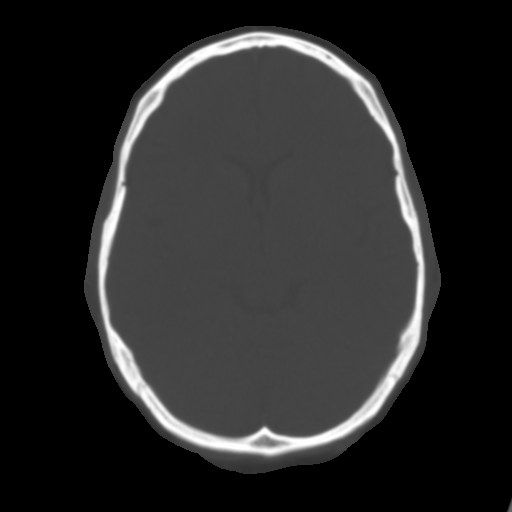
[im 13/30  brain]
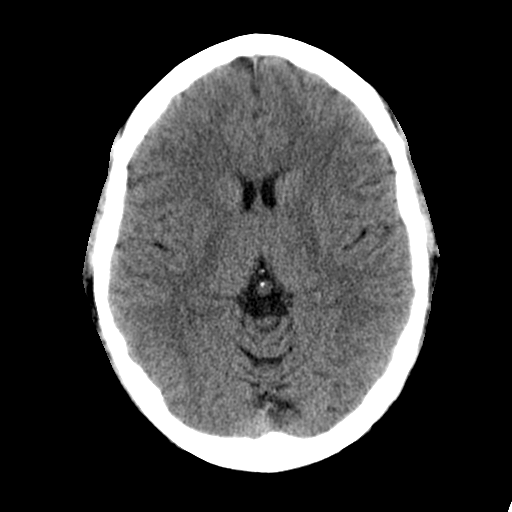
[im 15/30  brain]
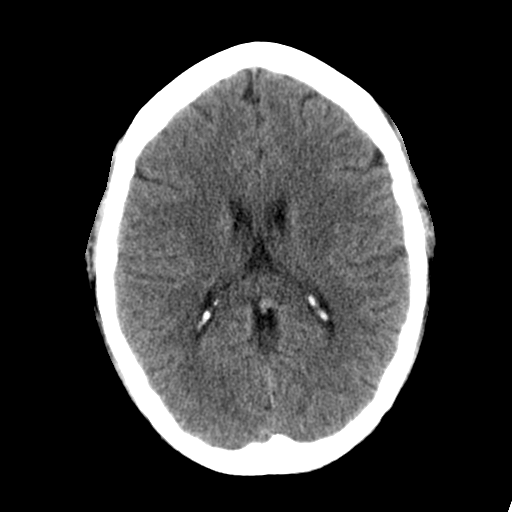
[im 17/30  brain]
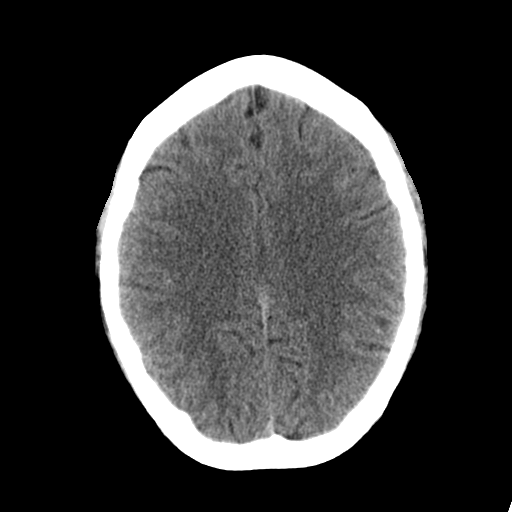
[im 19/30  brain]
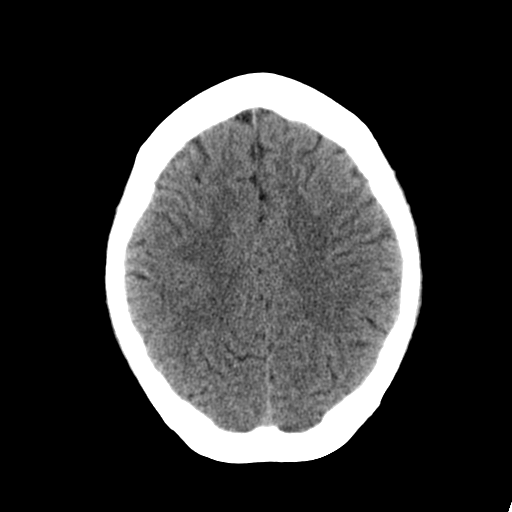
[im 19/30  bone]
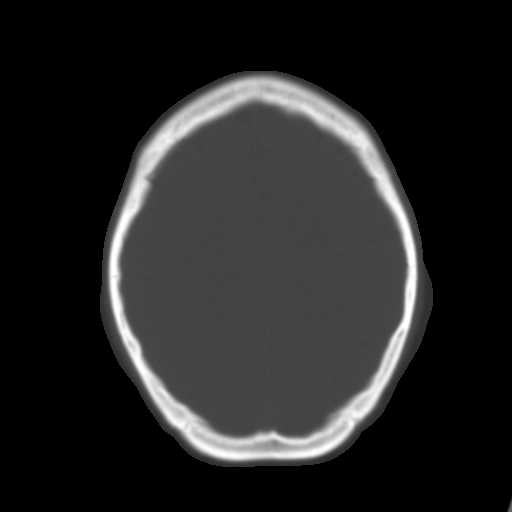
[im 21/30  brain]
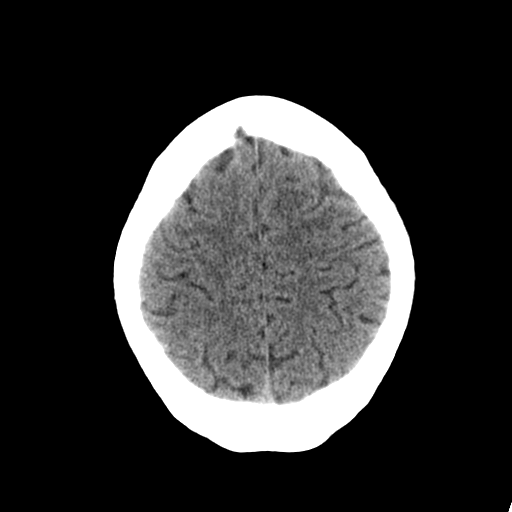
[im 23/30  brain]
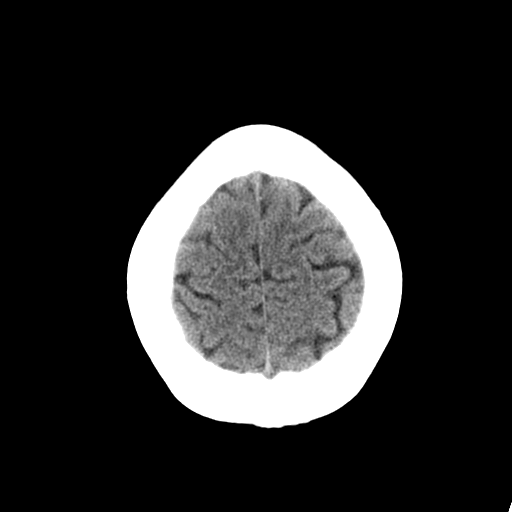
[im 25/30  brain]
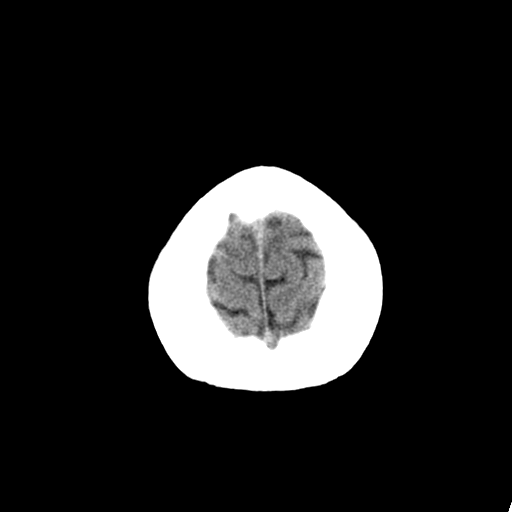
[im 27/30  brain]
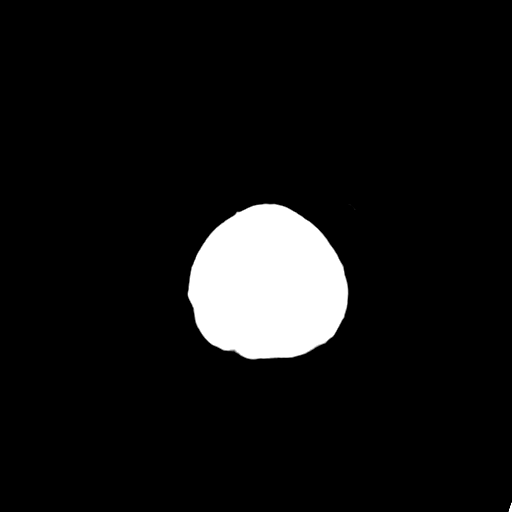
[im 27/30  bone]
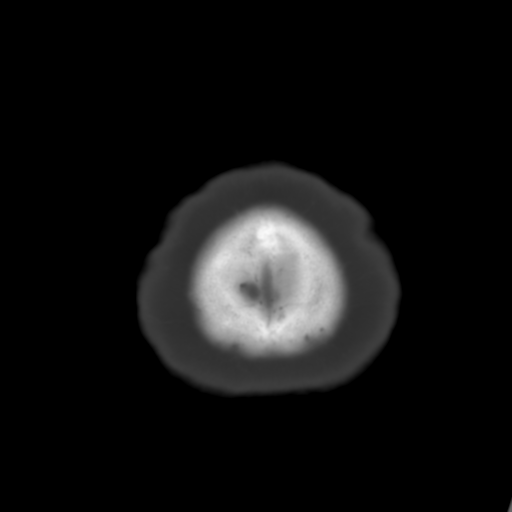

[Series 3: bone · axial · 0.41mm/px · z∈[-102,-62]mm · 3 of 30 slices shown]
[im 3/30  bone]
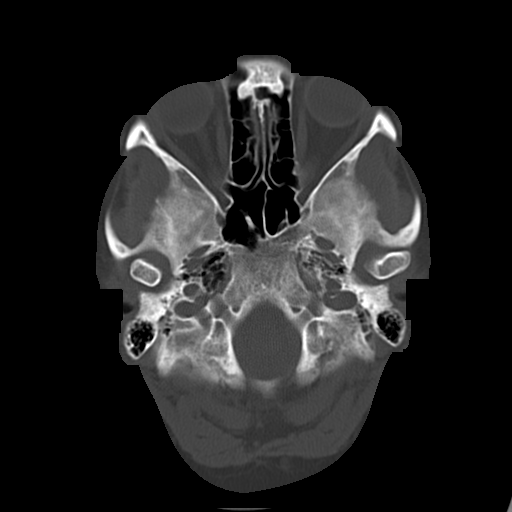
[im 7/30  bone]
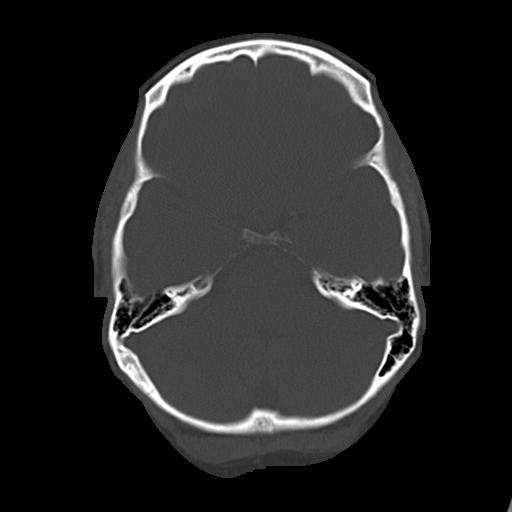
[im 11/30  bone]
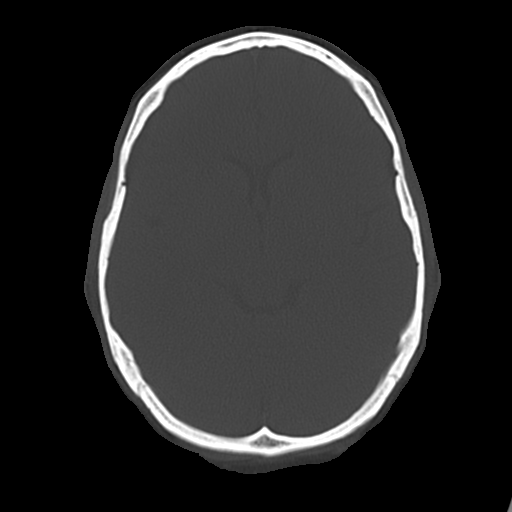

[16 of 30 positions shown; findings below may reference images not displayed]

FINDINGS: There is no evidence of mass effect, midline shift, or extra-axial fluid
collections.  There is no evidence of a space-occupying lesion or
intracranial hemorrhage. There is no evidence of a cortical-based area of
acute infarction.

The ventricles and sulci are appropriate for the patient's age. The basal
cisterns are patent.

Visualized portions of the orbits are unremarkable. The visualized portions
of the paranasal sinuses and mastoid air cells are unremarkable.

The osseous structures are unremarkable.
IMPRESSION: No acute intracranial process.

[REDACTED]

## 2014-03-25 ENCOUNTER — Other Ambulatory Visit (HOSPITAL_BASED_OUTPATIENT_CLINIC_OR_DEPARTMENT_OTHER): Payer: Self-pay

## 2014-03-25 ENCOUNTER — Other Ambulatory Visit (HOSPITAL_BASED_OUTPATIENT_CLINIC_OR_DEPARTMENT_OTHER): Payer: Medicare Other

## 2014-03-25 DIAGNOSIS — N186 End stage renal disease: Secondary | ICD-10-CM

## 2014-03-26 ENCOUNTER — Ambulatory Visit (HOSPITAL_BASED_OUTPATIENT_CLINIC_OR_DEPARTMENT_OTHER): Payer: Medicare Other | Admitting: Unknown Physician Specialty

## 2014-04-22 ENCOUNTER — Other Ambulatory Visit (HOSPITAL_BASED_OUTPATIENT_CLINIC_OR_DEPARTMENT_OTHER): Payer: Self-pay | Admitting: Internal Medicine

## 2014-04-22 DIAGNOSIS — N186 End stage renal disease: Secondary | ICD-10-CM

## 2014-04-25 ENCOUNTER — Encounter (HOSPITAL_BASED_OUTPATIENT_CLINIC_OR_DEPARTMENT_OTHER): Payer: Self-pay | Admitting: Internal Medicine

## 2014-05-02 ENCOUNTER — Other Ambulatory Visit (HOSPITAL_BASED_OUTPATIENT_CLINIC_OR_DEPARTMENT_OTHER): Payer: Medicare Other

## 2014-06-18 ENCOUNTER — Other Ambulatory Visit (HOSPITAL_BASED_OUTPATIENT_CLINIC_OR_DEPARTMENT_OTHER): Payer: Medicare Other

## 2014-06-19 ENCOUNTER — Encounter (HOSPITAL_BASED_OUTPATIENT_CLINIC_OR_DEPARTMENT_OTHER): Payer: Self-pay

## 2014-07-18 ENCOUNTER — Ambulatory Visit: Payer: Medicare Other | Attending: Cardiovascular Disease

## 2014-07-18 ENCOUNTER — Other Ambulatory Visit (HOSPITAL_COMMUNITY): Payer: Self-pay

## 2014-07-18 DIAGNOSIS — N186 End stage renal disease: Secondary | ICD-10-CM | POA: Insufficient documentation

## 2014-08-08 ENCOUNTER — Encounter (HOSPITAL_BASED_OUTPATIENT_CLINIC_OR_DEPARTMENT_OTHER): Payer: Self-pay

## 2014-08-25 ENCOUNTER — Encounter (HOSPITAL_BASED_OUTPATIENT_CLINIC_OR_DEPARTMENT_OTHER): Payer: Self-pay

## 2014-09-02 ENCOUNTER — Other Ambulatory Visit (HOSPITAL_BASED_OUTPATIENT_CLINIC_OR_DEPARTMENT_OTHER): Payer: Self-pay

## 2014-09-02 DIAGNOSIS — N186 End stage renal disease: Secondary | ICD-10-CM

## 2014-09-03 ENCOUNTER — Encounter (HOSPITAL_BASED_OUTPATIENT_CLINIC_OR_DEPARTMENT_OTHER): Payer: Self-pay | Admitting: Registered Nurse

## 2014-09-03 ENCOUNTER — Ambulatory Visit: Payer: Medicare Other | Attending: Registered Nurse | Admitting: Registered Nurse

## 2014-09-03 ENCOUNTER — Other Ambulatory Visit (HOSPITAL_BASED_OUTPATIENT_CLINIC_OR_DEPARTMENT_OTHER): Payer: Self-pay

## 2014-09-03 VITALS — BP 168/84 | HR 70 | Temp 97.7°F | Ht 65.87 in | Wt 167.3 lb

## 2014-09-03 DIAGNOSIS — N186 End stage renal disease: Secondary | ICD-10-CM | POA: Insufficient documentation

## 2014-09-03 DIAGNOSIS — E1021 Type 1 diabetes mellitus with diabetic nephropathy: Secondary | ICD-10-CM | POA: Insufficient documentation

## 2014-09-03 DIAGNOSIS — Z01818 Encounter for other preprocedural examination: Secondary | ICD-10-CM | POA: Insufficient documentation

## 2014-09-03 LAB — KIDNEY TRX, BASIC PANEL 1
Anion Gap: 16 — ABNORMAL HIGH (ref 4–12)
Calcium: 7.9 mg/dL — ABNORMAL LOW (ref 8.9–10.2)
Carbon Dioxide, Total: 22 mEq/L (ref 22–32)
Chloride: 98 mEq/L (ref 98–108)
Creatinine: 13.51 mg/dL — ABNORMAL HIGH (ref 0.38–1.02)
GFR, Calc, African American: 4 mL/min — ABNORMAL LOW (ref >59)
GFR, Calc, European American: 3 mL/min — ABNORMAL LOW (ref >59)
Glucose: 169 mg/dL — ABNORMAL HIGH (ref 62–125)
Hematocrit: 42 % (ref 36–45)
Hemoglobin: 13.4 g/dL (ref 11.5–15.5)
MCH: 30 pg (ref 27.3–33.6)
MCHC: 32.2 g/dL (ref 32.2–36.5)
MCV: 93 fL (ref 81–98)
Platelet Count: 199 10*3/uL (ref 150–400)
Potassium: 6.4 mEq/L (ref 3.6–5.2)
RBC: 4.47 mil/uL (ref 3.80–5.00)
RDW-CV: 15.7 % — ABNORMAL HIGH (ref 11.6–14.4)
Sodium: 136 mEq/L (ref 135–145)
Urea Nitrogen: 67 mg/dL — ABNORMAL HIGH (ref 8–21)
WBC: 10.21 10*3/uL — ABNORMAL HIGH (ref 4.3–10.0)

## 2014-09-03 LAB — LIPID PANEL
Cholesterol (LDL): 92 mg/dL (ref <130)
Cholesterol/HDL Ratio: 3
HDL Cholesterol: 58 mg/dL (ref >39)
Non-HDL Cholesterol: 114 mg/dL (ref 0–159)
Total Cholesterol: 172 mg/dL (ref <200)
Triglyceride: 112 mg/dL (ref <150)

## 2014-09-03 LAB — HEPATIC FUNCTION PANEL
ALT (GPT): 11 U/L (ref 7–33)
AST (GOT): 15 U/L (ref 9–38)
Albumin: 4.9 g/dL (ref 3.5–5.2)
Alkaline Phosphatase (Total): 115 U/L — ABNORMAL HIGH (ref 25–100)
Bilirubin (Direct): 0.1 mg/dL (ref 0.0–0.3)
Bilirubin (Total): 0.5 mg/dL (ref 0.2–1.3)
Protein (Total): 8.1 g/dL (ref 6.0–8.2)

## 2014-09-03 LAB — PHOSPHATE: Phosphate: 7.2 mg/dL — ABNORMAL HIGH (ref 2.5–4.5)

## 2014-09-03 LAB — MAGNESIUM: Magnesium: 2.7 mg/dL — ABNORMAL HIGH (ref 1.8–2.4)

## 2014-09-03 NOTE — Progress Notes (Signed)
Transplant Nephrology - Return Visit Pretransplant Evaluation Clinic  Encounter Date: 09/03/2014  Accompanied By: This patient is unaccompanied.  Primary nephrologist: Laney Potash   Transplant Nephrology Attending: Corwin Levins    Chief Complaint:     Audrey Bullock presents to the [facility] for a revisit regarding her candidacy for kidney/pancreas transplantation, currently approved pending completion of her workup. She began dialysis on 04/06/11.     History of Present Illness:    Audrey Bullock is a 29 year old African American woman with a history of ESRD secondary to type 1 diabetes. She also has a history of hypertension.     Please see my note on 12/31/13. In the interim since Audrey Bullock was last seen in the [facility], she has been able to improve her pulmonary hypertension as evidenced by repeat echocardiograms through watching her fluid intake. She denies any hospitalizations or serious illnesses. She is now taking promethazine with codeine syrup for coughing with abnormal pulmonary function testing, which is very helpful.    [name] endorses a kidney diet - she mostly eats at home. She walks on her lunch breaks 5 days a week for exercise. She reports that she can walk half an hour without stopping. Exertion is limited by back pain, no DOE or chest pain. She states that her at-home blood pressures are typically 160s/70s - 210s/90s and at-home blood sugars are typically <200, denies any lows. She states that she barely drinks water, unless she is taking pills.    Review of Systems -   She denies fevers, chills, chest pain, palpitations, shortness of breath, claudication, edema, depression, anxiety. She states that she feels fluid overloaded right now, but denies shortness of breath or any particular symptoms, just "feels icky". Denies any headache, nausea, vomiting.    Renal history:   Dialysis start date & Modality: 04/06/11; home HD 4d/week  (sun, tues, thurs, fri)  Dry weight: 70 - removed about 4 kg per dialysis run  She is anuric.  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 Hx of 1 blood transfusion.    Pregnancies: G 2 P 1    Past Surgical History   Procedure Laterality Date   . Vascular access surgery     . Cesarean delivery only  2011       Social History  She denies smoking, alcohol or illicit drug use.  She denies any changes in post-transplant caregiver status. She states that her mother and her sister will be her caregivers, they are not working now. She plans to move in with her mother in June or sooner if she receives the transplant.    Family History  Family History   Problem Relation Age of Onset   . No Known Problems Mother    . No Known Problems  Father    . Diabetes Maternal Grandmother    . Renal Disease Maternal Grandmother      She denies a family history of cardiovascular disease or cancer.    Current Outpatient Prescriptions   Medication Sig Dispense Refill   . AmLODIPine Besylate 5 MG Oral Tab Take 5 mg by mouth.     . B Complex-C-Folic Acid (NEPHROCAPS) 1 MG Oral Cap      . Calcitriol 0.5 MCG Oral Cap      . Calcium Acetate 667 MG Oral Cap Take 2,668 mg by mouth.     . Insulin Lispro, Human, (HUMALOG) 100 UNIT/ML Subcutaneous Solution inject 6-10 units subcutaneously before meals if needed     .  Oxycodone-Acetaminophen (ROXICET) 5-325 MG Oral Tab Take by mouth daily as needed.       No current facility-administered medications for this visit.     Medication Notes:   I have reviewed the above list of medicines with the patient and it is accurate.    BP 168/84 mmHg  Pulse 70  Temp(Src) 97.7 F (36.5 C) (Temporal)  Ht 5' 5.87" (1.673 m)  Wt 167 lb 5.3 oz (75.901 kg)  BMI 27.12 kg/m2  SpO2 96%  Estimated body mass index is 27.12 kg/(m^2) as calculated from the following:    Height as of this encounter: 5' 5.87" (1.673 m).    Weight as of this encounter: 167 lb 5.3 oz (75.901 kg).     Physical Exam:   Vitals: As above  General: 29 year old African American woman in no acute distress.  HEENT: Anicteric sclerae, moist mucosa without erythema or exudate. Dentition without infection noted.  Neck: Supple, without lymphadenopathy. L carotid bruit or transmitted fistula bruit. No R carotid bruit.  Lungs: Clear to auscultation, without wheeze or rales.  Cardiovascular: Regular rate and rhythm, without gallop or rub. 2/6 systolic murmur or transmitted fistula bruit.  Abdominal: Soft, nontender, and nondistended with normal active bowel sounds. No hepatosplenomegaly noted. Insulin pump in place.  Extremities: No clubbing, cyanosis. No edema bilaterally. Femoral pulses 2+ and without bruits. Radial and pedal pulses are 2+. No lower extremity discoloration or atrophic changes noted. There is a left upper extremity AV fistula that is intact with a normal bruit and thrill.   Neurologic: Grossly normal, without tremor. Alert & oriented x4.  Skin: Normal without rash. No lower extremity ulceration.     RESULTS REVIEWED.  Orders Only on 09/03/14   1. KIDNEY TRX, BASIC PANEL 1   Result Value Ref Range    Sodium 136 135 - 145 mEq/L    Potassium 6.4 (HH) 3.6 - 5.2 mEq/L    Chloride 98 98 - 108 mEq/L    Carbon Dioxide, Total 22 22 - 32 mEq/L    Anion Gap 16 (H) 4 - 12    Glucose 169 (H) 62 - 125 mg/dL    Urea Nitrogen 67 (H)  8 - 21 mg/dL    Creatinine 13.51 (H) 0.38 - 1.02 mg/dL    Calcium 7.9 (L) 8.9 - 10.2 mg/dL    GFR, Calc, European American 3 (L) >59 mL/min    GFR, Calc, African American 4 (L) >59 mL/min    GFR, Information       Calculated GFR in mL/min/1.73 m2 by MDRD equation.  Inaccurate with changing renal function.  See [email or web address]://[email or web address].[email or web address]    WBC 10.21 (H) 4.3 - 10.0 THOU/uL    RBC 4.47 3.80 - 5.00 mil/uL    Hemoglobin  13.4 11.5 - 15.5 g/dL    Hematocrit 42 36 - 45 %    MCV 93 81 - 98 fL    MCH 30.0 27.3 - 33.6 pg    MCHC 32.2 32.2 - 36.5 g/dL    Platelet Count 199 150 - 400 THOU/uL    RDW-CV 15.7 (H) 11.6 - 14.4 %   2. MAGNESIUM   Result Value Ref Range    Magnesium 2.7 (H) 1.8 - 2.4 mg/dL   3. PHOSPHATE   Result Value Ref Range    Phosphate 7.2 (H) 2.5 - 4.5 mg/dL   4. HEPATITIS A,B,& C PANEL   Result Value Ref Range    Hepatitis B S Ag w/Rflx PCR Nonreactive NREAC    Hepatitis B Surface Ab Reactive     Hepatitis B Surf Antibody Intl Units 65.30 IU    Hepatitis B Core Ab Nonreactive NREAC    Hepatitis A Ab Reactive     Hepatitis A Antibody Ig Class IgG     Hepatitis A and B Interpretation       Evidence of past (resolved) Hepatitis A and Hepatitis B infections or immunizations.    Hepatitis C Antibody w/Rflx PCR Nonreactive NREAC    Hepatitis C Antibody Interpretation       No evidence of antibody to hepatitis C virus (HCV).  Anti-HCV may develop slowly (6 weeks - 6 months) over the course of hepatitis C virus infection.  Rarely it may be absent in cases of chronic   infection.  Anti-HCV may also disappear after recovery from acute, self-limited disease.     5. PSBC HLA AB DETECTION   Result Value Ref Range    PSBC HLA Ab Detection Result       Test(s) performed by [facility],  [address] [place] [address]   6. LIPID PANEL   Result Value Ref Range    Cholesterol (Total) 172 <200 mg/dL    Triglyceride 112 <150 mg/dL    Cholesterol (HDL) 58 >39 mg/dL    Cholesterol (LDL) 92  <130 mg/dL    Non-HDL Cholesterol 114 0 - 159 mg/dL    Cholesterol/HDL Ratio 3.0     Lipid Panel, Additional Info. (NOTE)    7. HEPATIC FUNCTION PANEL   Result Value Ref Range    Albumin 4.9 3.5 - 5.2 g/dL    Protein (Total) 8.1 6.0 - 8.2 g/dL    Bilirubin (Total) 0.5 0.2 - 1.3 mg/dL    Bilirubin (Direct) 0.1 0.0 - 0.3 mg/dL    Alkaline Phosphatase (Total) 115 (H) 25 - 100 U/L    AST (GOT) 15 9 - 38 U/L    ALT (GPT) 11 7 - 33 U/L   8. HEMOGLOBIN A1C, HPLC   Result Value Ref Range    Hemoglobin A1C 7.4 (H) 4.0 - 6.0 %       Myocardial Perfusion Scan: Yes, 10/26/13 no scintigraphic evidence of myocardial ischemia or infarction. The left ventricular cavity size is mildly enlarged.  Echocardiogram: Yes,   -07/18/14 Increased LV end-diastolic volume, which may reflect increased circulating blood volume in a patient with chronic kidney disease, with normal systolic function (WV=37%) and regional wall motion. Difficult diastology as discussed; LV filling pressures may be elevated. No hemodynamically important valve malfunction. Upper normal RV size with normal function and filling pressure. Estimated sPAP is 43-48 mmHg. No pericardial fluid.  -10/26/13 Moderate concentric left ventricular hypertrophy with normal volumes and normal systolic function, EF 10%; Moderate pulmonary hypertension with  estimated pressures of 49 to 54 mmHg.    Chest X-ray: 03/21/14 The heart is enlarged without evidence of edema.  Electrocardiogram: 10/26/13 baseline ECG was normal     Carotid Doppler: Yes, 03/23/13 0-15% bilaterally  Lower Extremity Doppler: Yes, 03/23/13 No significant stenosis  CT Abdomen: Yes, 4 phase liver CT 04/20/13 1. Stable segment 4 hepatic lesion near the IVC. Rest of the arterial enhancing lesions seen on the previous study are not well visualized, likely due to difference in phases. These lesions likely represent adenomas /FNH. A followup Eovist enhanced MRI is suggested to differentiate between the two. 2. The two hypodense  subcentimeter hepatic lesion seen on the previous study also stable and likely represent hemangiomas. 3. Mild atherosclerotic calcifications noted involving bilateral renal arteries, splenic artery and the SMA. No aneurysmal dilatation or stenosis. -- Per Dr. Dellia Cloud, the MRI would be useful to differentiate adenomas from [facility] but is not necessary to clear for transplant.    Cancer Screening: Yes, Pap smear 03/01/13 negative  Dental Clearance: Yes, 04/27/13  PRA:  0% 05/30/12; B+   Vaccinations: Hepatitis A 09/25/13, Hepatitis Bab+, prevnar 05/30/12, Tdap 05/30/12  Serologies: 05/30/12 CMV +, EBV +, HCV Ab -, HBsAg -, HBsAb +, HBcAb -, HAV Ab -, Quantiferon -    Other:  PFTs 03/21/14 - These pulmonary function tests are consistent with moderate restrictive ventilatory defect with associated moderate reduction in diffusion capacity when adjusted for Hemoglobin (9.9 g/dL, LOW). There is concomitant mild obstructive lung disease, mainly at the level of the small airways.    Consults/Specialty Care:  -Pulmonology 05/20/14 Dr. Debbe Mounts - dyspnea, related to her volume status & ESRD. Methacholine for asthma. Consider targeting lower dry wt & more frequesnt HD runs.     Impression and Plan:    Marilou Barnfield is a 29 year old African American woman with a history of ESRD secondary to type 1 diabetes. She has an adequate understanding of the transplantation process and is appropriately motivated. She seems to have a good care plan in place, although her caretakers are not available today. I encouraged her to bring them in the future. She denies any concern about taking transplant medications as prescribed.    We discussed the risks and benefits of kidney transplantation, including surgical and immunosuppression risks, cardiovascular events or even death and Ms. Sorlie wishes to proceed further with the evaluation process. Surgical risks include cardiovascular, peripheral vascular, pulmonary and neurologic  complications. Immunosuppression risks include rejection, infection, malignancy, diabetes and complications, weight gain and neurologic complications.     The benefits of renal transplantation over maintenance dialysis were explained. She does wish to consider CDC high risk donors.    Earney Mallet Rosenboom's case will be discussed by the Transplant team in our weekly transplant meeting and a decision regarding candidacy, further workup and listing will be made at that time. I will recommend Audrey Bullock as being a likely reasonable candidate for kidney/pancreas transplantation.    From a cardiovascular pre-transplant evaluation perspective I recommend improved blood pressure control and likely further evaluation with pulmonology for pulmonary hypertension.   From a malignancy screening perspective she is up to date.   From an infectious screening and risk evaluation perspective I recommend our routine serological labs and vaccinations.  Initially had a blood pressure of 212/112, but resolved to 168/84 with 5 minutes of rest.    Holly Pond  Transplant Nephrology  Division of Nephrology  [facility]  [place], [place]    Addendum 09/05/2014 16:17: Discussed in Selection Conference - due to the elevated blood pressure, elevated potassium, and elevated phosphate, there are ongoing concerns for poor compliance. We will ask our social worker to follow up on her compliance issues and post-surgical care plan.

## 2014-09-04 LAB — HLA AB DETECTION (SENDOUT)

## 2014-09-04 LAB — HEMOGLOBIN A1C, HPLC: Hemoglobin A1C: 7.4 % — ABNORMAL HIGH (ref 4.0–6.0)

## 2014-09-05 LAB — HEPATITIS A,B,& C PANEL
Hepatitis A Ab: REACTIVE
Hepatitis B Core Ab: NONREACTIVE
Hepatitis B Surf Antibody Intl Units: 65.3 IU
Hepatitis B Surface Ab: REACTIVE
Hepatitis B Surface Antigen w/Reflex: NONREACTIVE
Hepatitis C Antibody w/Rflx PCR: NONREACTIVE

## 2014-09-06 ENCOUNTER — Encounter (HOSPITAL_BASED_OUTPATIENT_CLINIC_OR_DEPARTMENT_OTHER): Payer: Self-pay

## 2014-09-10 LAB — C_PEPTIDE: C Peptide: 0.4 ng/mL — ABNORMAL LOW (ref 1.0–7.1)

## 2014-09-10 NOTE — Progress Notes (Signed)
I have seen and evaluated Audrey Bullock with Ms. Celesta Aver and have performed or reviewed the history, review of systems, physical exam as documented in her note.     I have discussed concerns with this patient regarding adherence to healthcare, including adequate blood pressure and dialysis, healthy nutrition and limited liquid intake, along with adequate social support.     The benefits of living kidney donation were explained in detail.  The risks of surgery and immunosuppression were explained in detail. These include surgical complications, infections, cardiovascular events and even death and Audrey Bullock wishes to proceed with the evaluation.     Earney Mallet Arnold's case will be discussed by the transplantation team in our weekly meetings and a decision regarding listing and further workup will be made at that time. Overall I will recommend Audrey Bullock as a high risk candidate for renal transplantation at this time.     Thank you for the opportunity to evaluate this very nice patient. Please do not hesitate to contact me with any questions or comments at [phone].     This represents a shared visit with Carlena Sax, ARNP required for the Transplant Nephrology expertise.     Adair Patter, MD  Department of Medicine - Division of Nephrology & Transplantation  [facility]  Pager [phone]

## 2014-11-18 ENCOUNTER — Encounter (HOSPITAL_BASED_OUTPATIENT_CLINIC_OR_DEPARTMENT_OTHER): Payer: Self-pay

## 2014-11-27 ENCOUNTER — Other Ambulatory Visit (HOSPITAL_BASED_OUTPATIENT_CLINIC_OR_DEPARTMENT_OTHER): Payer: Self-pay | Admitting: Registered Nurse

## 2014-11-27 DIAGNOSIS — N186 End stage renal disease: Secondary | ICD-10-CM

## 2014-12-04 ENCOUNTER — Other Ambulatory Visit: Payer: Self-pay | Admitting: Registered Nurse

## 2015-01-01 ENCOUNTER — Other Ambulatory Visit: Payer: Self-pay | Admitting: Registered Nurse

## 2015-01-02 ENCOUNTER — Encounter (HOSPITAL_BASED_OUTPATIENT_CLINIC_OR_DEPARTMENT_OTHER): Payer: Self-pay

## 2015-01-03 ENCOUNTER — Encounter (HOSPITAL_BASED_OUTPATIENT_CLINIC_OR_DEPARTMENT_OTHER): Payer: Self-pay

## 2015-01-03 ENCOUNTER — Ambulatory Visit: Payer: Medicaid Other | Attending: Cardiology

## 2015-01-03 ENCOUNTER — Ambulatory Visit (HOSPITAL_BASED_OUTPATIENT_CLINIC_OR_DEPARTMENT_OTHER): Payer: Medicaid Other

## 2015-01-03 DIAGNOSIS — Z01810 Encounter for preprocedural cardiovascular examination: Secondary | ICD-10-CM

## 2015-01-03 DIAGNOSIS — Z01818 Encounter for other preprocedural examination: Secondary | ICD-10-CM | POA: Insufficient documentation

## 2015-01-03 DIAGNOSIS — N186 End stage renal disease: Secondary | ICD-10-CM | POA: Insufficient documentation

## 2015-02-05 ENCOUNTER — Encounter (HOSPITAL_BASED_OUTPATIENT_CLINIC_OR_DEPARTMENT_OTHER): Payer: Self-pay | Admitting: Registered Nurse

## 2015-02-06 ENCOUNTER — Other Ambulatory Visit (HOSPITAL_BASED_OUTPATIENT_CLINIC_OR_DEPARTMENT_OTHER): Payer: Self-pay

## 2015-02-06 ENCOUNTER — Encounter (HOSPITAL_BASED_OUTPATIENT_CLINIC_OR_DEPARTMENT_OTHER): Payer: Self-pay

## 2015-02-21 ENCOUNTER — Ambulatory Visit: Payer: Self-pay | Admitting: Psychiatry

## 2015-02-21 ENCOUNTER — Ambulatory Visit: Payer: Self-pay | Admitting: Licensed Clinical Social Worker

## 2015-05-16 ENCOUNTER — Encounter: Payer: Self-pay | Admitting: *Deleted

## 2015-05-19 ENCOUNTER — Ambulatory Visit: Payer: BLUE CROSS/BLUE SHIELD | Admitting: Anesthesiology

## 2015-05-19 ENCOUNTER — Ambulatory Visit
Admission: RE | Admit: 2015-05-19 | Discharge: 2015-05-19 | Disposition: A | Discharge status: Home Health | Payer: BLUE CROSS/BLUE SHIELD | Source: Ambulatory Visit | Attending: Gastroenterology | Admitting: Gastroenterology

## 2015-05-19 ENCOUNTER — Encounter
Admission: RE | Disposition: A | Discharge status: Home Health | Payer: Self-pay | Source: Ambulatory Visit | Attending: Gastroenterology

## 2015-05-19 ENCOUNTER — Encounter: Payer: Self-pay | Admitting: *Deleted

## 2015-05-19 DIAGNOSIS — Z79899 Other long term (current) drug therapy: Secondary | ICD-10-CM | POA: Diagnosis not present

## 2015-05-19 DIAGNOSIS — F4310 Post-traumatic stress disorder, unspecified: Secondary | ICD-10-CM | POA: Insufficient documentation

## 2015-05-19 DIAGNOSIS — F329 Major depressive disorder, single episode, unspecified: Secondary | ICD-10-CM | POA: Insufficient documentation

## 2015-05-19 DIAGNOSIS — R194 Change in bowel habit: Secondary | ICD-10-CM | POA: Diagnosis not present

## 2015-05-19 DIAGNOSIS — K625 Hemorrhage of anus and rectum: Secondary | ICD-10-CM | POA: Diagnosis not present

## 2015-05-19 DIAGNOSIS — Z87891 Personal history of nicotine dependence: Secondary | ICD-10-CM | POA: Diagnosis not present

## 2015-05-19 DIAGNOSIS — Z6839 Body mass index (BMI) 39.0-39.9, adult: Secondary | ICD-10-CM | POA: Insufficient documentation

## 2015-05-19 DIAGNOSIS — L309 Dermatitis, unspecified: Secondary | ICD-10-CM | POA: Insufficient documentation

## 2015-05-19 HISTORY — DX: Dorsalgia, unspecified: M54.9

## 2015-05-19 HISTORY — DX: Post-traumatic stress disorder, unspecified: F43.10

## 2015-05-19 HISTORY — DX: Chlamydial infection, unspecified: A74.9

## 2015-05-19 HISTORY — DX: Unspecified hemorrhoids: K64.9

## 2015-05-19 HISTORY — DX: Headache: R51

## 2015-05-19 HISTORY — DX: Headache, unspecified: R51.9

## 2015-05-19 HISTORY — DX: Dermatitis, unspecified: L30.9

## 2015-05-19 HISTORY — PX: COLONOSCOPY WITH PROPOFOL: SHX5780

## 2015-05-19 HISTORY — DX: Obesity, unspecified: E66.9

## 2015-05-19 HISTORY — DX: Major depressive disorder, single episode, unspecified: F32.9

## 2015-05-19 HISTORY — DX: Depression, unspecified: F32.A

## 2015-05-19 LAB — POCT PREGNANCY, URINE: Preg Test, Ur: NEGATIVE

## 2015-05-19 SURGERY — COLONOSCOPY WITH PROPOFOL
Anesthesia: General

## 2015-05-19 MED ORDER — LIDOCAINE HCL (CARDIAC) 20 MG/ML IV SOLN
INTRAVENOUS | Status: DC | PRN
  Administered 2015-05-19: 30 mg via INTRAVENOUS

## 2015-05-19 MED ORDER — SODIUM CHLORIDE 0.9 % IV SOLN: INTRAVENOUS | Status: DC

## 2015-05-19 MED ORDER — PROPOFOL 10 MG/ML IV BOLUS
INTRAVENOUS | Status: DC | PRN
  Administered 2015-05-19: 50 mg via INTRAVENOUS
  Administered 2015-05-19: 120 mg via INTRAVENOUS

## 2015-05-19 MED ORDER — SODIUM CHLORIDE 0.9 % IV SOLN
INTRAVENOUS | Status: DC
  Administered 2015-05-19: 1000 mL via INTRAVENOUS

## 2015-05-19 MED ORDER — PROPOFOL 500 MG/50ML IV EMUL
INTRAVENOUS | Status: DC | PRN
  Administered 2015-05-19: 150 ug/kg/min via INTRAVENOUS

## 2015-05-19 NOTE — Anesthesia Preprocedure Evaluation (Signed)
Anesthesia Evaluation  Patient identified by MRN, date of birth, ID band Patient awake    Reviewed: Allergy & Precautions, H&P , NPO status , Patient's Chart, lab work & pertinent test results, reviewed documented beta blocker date and time   Airway Mallampati: II  TM Distance: >3 FB Neck ROM: full    Dental no notable dental hx. (+) Teeth Intact   Pulmonary neg shortness of breath, neg sleep apnea, neg COPD, Recent URI , former smoker,    Pulmonary exam normal breath sounds clear to auscultation       Cardiovascular Exercise Tolerance: Good negative cardio ROS Normal cardiovascular exam Rhythm:regular Rate:Normal     Neuro/Psych PSYCHIATRIC DISORDERS (depression and PTSD) negative neurological ROS     GI/Hepatic negative GI ROS, Neg liver ROS,   Endo/Other  neg diabetesMorbid obesity  Renal/GU negative Renal ROS  negative genitourinary   Musculoskeletal   Abdominal   Peds  Hematology negative hematology ROS (+)   Anesthesia Other Findings Past Medical History:   Chlamydia                                                    Depression                                                   Headache                                                     Post traumatic stress disorder (PTSD)                        Obesity (BMI 30-39.9)                                        Hemorrhoids                                                  Eczema                                                       Back pain                                                    Reproductive/Obstetrics negative OB ROS                             Anesthesia Physical Anesthesia Plan  ASA: III  Anesthesia  Plan: General   Post-op Pain Management:    Induction:   Airway Management Planned:   Additional Equipment:   Intra-op Plan:   Post-operative Plan:   Informed Consent: I have reviewed the patients History and  Physical, chart, labs and discussed the procedure including the risks, benefits and alternatives for the proposed anesthesia with the patient or authorized representative who has indicated his/her understanding and acceptance.   Dental Advisory Given  Plan Discussed with: Anesthesiologist, CRNA and Surgeon  Anesthesia Plan Comments:         Anesthesia Quick Evaluation

## 2015-05-19 NOTE — H&P (Signed)
  Date of Initial H&P: 05/08/2015  History reviewed, patient examined, no change in status, stable for surgery.

## 2015-05-19 NOTE — Op Note (Signed)
[facility] Patient Name: Carla Garrett Procedure Date: 05/19/2015 1:41 PM MRN: [id number] Account #: [id number] Date of Birth: [birth year] Admit Type: Outpatient Age: 29 Room: [facility] ENDO ROOM 4 Gender: Female Note Status: Finalized Procedure:         Colonoscopy Indications:       Rectal bleeding, Change in bowel habits Providers:         [name]. Candace Cruise, MD Referring MD:      Juluis Rainier (Referring MD) Medicines:         Monitored Anesthesia Care Complications:     No immediate complications. Procedure:         Pre-Anesthesia Assessment:                    - Prior to the procedure, a History and Physical was                     performed, and patient medications, allergies and                     sensitivities were reviewed. The patient's tolerance of                     previous anesthesia was reviewed.                    - The risks and benefits of the procedure and the sedation                     options and risks were discussed with the patient. All                     questions were answered and informed consent was obtained.                    - After reviewing the risks and benefits, the patient was                     deemed in satisfactory condition to undergo the procedure.                    After obtaining informed consent, the colonoscope was                     passed under direct vision. Throughout the procedure, the                     patient's blood pressure, pulse, and oxygen saturations                     were monitored continuously. The Olympus CF-H180AL                     colonoscope ( S#: [id number] ) was introduced through the                     anus and advanced to the the terminal ileum, with                     identification of the appendiceal orifice and IC valve.                     The colonoscopy was performed without difficulty. The  patient tolerated the procedure well. The quality of the                  bowel preparation was good. Findings:      The terminal ileum appeared normal.      The colon (entire examined portion) appeared normal. Impression:        - The examined portion of the ileum was normal.                    - The entire examined colon is normal.                    - No specimens collected. Recommendation:    - Discharge patient to home.                    - The findings and recommendations were discussed with the                     patient. Procedure Code(s): --- Professional ---                    [phone], Colonoscopy, flexible; diagnostic, including                     collection of specimen(s) by brushing or washing, when                     performed (separate procedure) Diagnosis Code(s): --- Professional ---                    K62.5, Hemorrhage of anus and rectum                    R19.4, Change in bowel habit CPT copyright 2014 American Medical Association. All rights reserved. The codes documented in this report are preliminary and upon coder review may  be revised to meet current compliance requirements. Hulen Luster, MD 05/19/2015 1:53:27 PM This report has been signed electronically. Number of Addenda: 0 Note Initiated On: 05/19/2015 1:41 PM Scope Withdrawal Time: 0 hours 3 minutes 28 seconds  Total Procedure Duration: 0 hours 5 minutes 29 seconds       [facility]

## 2015-05-19 NOTE — Transfer of Care (Signed)
Immediate Anesthesia Transfer of Care Note  Patient: Carla Garrett  Procedure(s) Performed: Procedure(s): COLONOSCOPY WITH PROPOFOL (N/A)  Patient Location: Endoscopy Unit  Anesthesia Type:General  Level of Consciousness: sedated  Airway & Oxygen Therapy: Patient Spontanous Breathing and Patient connected to nasal cannula oxygen  Post-op Assessment: Report given to RN and Post -op Vital signs reviewed and stable  Post vital signs: Reviewed and stable  Last Vitals:  Filed Vitals:   05/19/15 1240  BP: 120/74  Pulse: 78  Temp: 36.6 C  Resp: 18    Complications: No apparent anesthesia complications

## 2015-05-19 NOTE — H&P (Signed)
    Primary Care Physician:  Sallee Lange, NP Primary Gastroenterologist:  Dr. Candace Cruise  Pre-Procedure History & Physical: HPI:  Carla Garrett is a 29 y.o. female is here for an colonoscopy.   Past Medical History  Diagnosis Date  . Chlamydia   . Depression   . Headache   . Post traumatic stress disorder (PTSD)   . Obesity (BMI 30-39.9)   . Hemorrhoids   . Eczema   . Back pain     Past Surgical History  Procedure Laterality Date  . Eye surgery    . Dilation and curettage, diagnostic / therapeutic    . Tonsillectomy      Prior to Admission medications   Medication Sig Start Date End Date Taking? Authorizing Provider  cetirizine (ZYRTEC) 10 MG tablet Take 10 mg by mouth daily.   Yes Historical Provider, MD  levonorgestrel (MIRENA) 20 MCG/24HR IUD 1 each by Intrauterine route once.   Yes Historical Provider, MD  saccharomyces boulardii (FLORASTOR) 250 MG capsule Take 250 mg by mouth 2 (two) times daily.   Yes Historical Provider, MD  sertraline (ZOLOFT) 100 MG tablet Take 200 mg by mouth daily.   Yes Historical Provider, MD    Allergies as of 05/09/2015  . (Not on File)    History reviewed. No pertinent family history.  Social History   Social History  . Marital Status: Married    Spouse Name: N/A  . Number of Children: N/A  . Years of Education: N/A   Occupational History  . Not on file.   Social History Main Topics  . Smoking status: Former Research scientist (life sciences)  . Smokeless tobacco: Never Used  . Alcohol Use: No  . Drug Use: No  . Sexual Activity: Not on file   Other Topics Concern  . Not on file   Social History Narrative    Review of Systems: See HPI, otherwise negative ROS  Physical Exam: BP 120/74 mmHg  Pulse 78  Temp(Src) 97.9 F (36.6 C) (Oral)  Resp 18  Ht 5' 1.5" (1.562 m)  Wt 95.255 kg (210 lb)  BMI 39.04 kg/m2  SpO2 99% General:   Alert,  pleasant and cooperative in NAD Head:  Normocephalic and atraumatic. Neck:  Supple; no masses or  thyromegaly. Lungs:  Clear throughout to auscultation.    Heart:  Regular rate and rhythm. Abdomen:  Soft, nontender and nondistended. Normal bowel sounds, without guarding, and without rebound.   Neurologic:  Alert and  oriented x4;  grossly normal neurologically.  Impression/Plan: Carla Garrett is here for an colonoscopy to be performed for changes in bowel habits.  Risks, benefits, limitations, and alternatives regarding  colonoscopy have been reviewed with the patient.  Questions have been answered.  All parties agreeable.   [name], [name], MD  05/19/2015, 1:17 PM

## 2015-05-20 NOTE — Anesthesia Postprocedure Evaluation (Signed)
  Anesthesia Post-op Note  Patient: Carla Garrett  Procedure(s) Performed: Procedure(s): COLONOSCOPY WITH PROPOFOL (N/A)  Anesthesia type:General  Patient location: PACU  Post pain: Pain level controlled  Post assessment: Post-op Vital signs reviewed, Patient's Cardiovascular Status Stable, Respiratory Function Stable, Patent Airway and No signs of Nausea or vomiting  Post vital signs: Reviewed and stable  Last Vitals:  Filed Vitals:   05/19/15 1427  BP: 118/66  Pulse: 55  Temp:   Resp: 12    Level of consciousness: awake, alert  and patient cooperative  Complications: No apparent anesthesia complications

## 2015-05-21 ENCOUNTER — Encounter: Payer: Self-pay | Admitting: Gastroenterology

## 2015-06-27 ENCOUNTER — Telehealth: Payer: Self-pay

## 2015-06-27 NOTE — Telephone Encounter (Signed)
pt called stated that she needed a refill on her zoloft.  pt was told that the last time she was seen was 11-28-14 (pt canceled her appt on  02-21-15) pt was told that she woulld have to go to er since it has been so long being seen.  pt was made and appt  For  07-16-14 at 9:00 pt would like to find out if she can get just enough to last until her next appt.

## 2015-06-27 NOTE — Telephone Encounter (Signed)
called pt and told her that per dr. Gwyndolyn Saxon rx would not be refilled.  pt was advised to go to er or see if pcp would refill

## 2015-07-17 ENCOUNTER — Ambulatory Visit: Payer: BLUE CROSS/BLUE SHIELD | Admitting: Psychiatry

## 2015-07-25 ENCOUNTER — Encounter (HOSPITAL_BASED_OUTPATIENT_CLINIC_OR_DEPARTMENT_OTHER): Payer: Self-pay

## 2015-12-05 ENCOUNTER — Encounter (HOSPITAL_BASED_OUTPATIENT_CLINIC_OR_DEPARTMENT_OTHER): Payer: Self-pay

## 2015-12-18 ENCOUNTER — Inpatient Hospital Stay (HOSPITAL_BASED_OUTPATIENT_CLINIC_OR_DEPARTMENT_OTHER)
Admission: EM | Admit: 2015-12-18 | Discharge: 2015-12-25 | DRG: 682 | Disposition: A | Discharge status: Home / Self Care | Payer: Medicare Other | Attending: Internal Medicine | Admitting: Internal Medicine

## 2015-12-18 ENCOUNTER — Inpatient Hospital Stay (HOSPITAL_COMMUNITY): Payer: Medicare Other | Admitting: Internal Medicine

## 2015-12-18 DIAGNOSIS — E875 Hyperkalemia: Secondary | ICD-10-CM | POA: Diagnosis not present

## 2015-12-18 DIAGNOSIS — T82898A Other specified complication of vascular prosthetic devices, implants and grafts, initial encounter: Secondary | ICD-10-CM | POA: Diagnosis present

## 2015-12-18 DIAGNOSIS — Z9641 Presence of insulin pump (external) (internal): Secondary | ICD-10-CM | POA: Diagnosis present

## 2015-12-18 DIAGNOSIS — N186 End stage renal disease: Secondary | ICD-10-CM | POA: Diagnosis present

## 2015-12-18 DIAGNOSIS — H540 Blindness, both eyes: Secondary | ICD-10-CM | POA: Diagnosis present

## 2015-12-18 DIAGNOSIS — I6783 Posterior reversible encephalopathy syndrome: Secondary | ICD-10-CM | POA: Diagnosis present

## 2015-12-18 DIAGNOSIS — Z8249 Family history of ischemic heart disease and other diseases of the circulatory system: Secondary | ICD-10-CM

## 2015-12-18 DIAGNOSIS — K3184 Gastroparesis: Secondary | ICD-10-CM | POA: Diagnosis present

## 2015-12-18 DIAGNOSIS — R7989 Other specified abnormal findings of blood chemistry: Secondary | ICD-10-CM

## 2015-12-18 DIAGNOSIS — I120 Hypertensive chronic kidney disease with stage 5 chronic kidney disease or end stage renal disease: Secondary | ICD-10-CM

## 2015-12-18 DIAGNOSIS — I161 Hypertensive emergency: Secondary | ICD-10-CM | POA: Diagnosis present

## 2015-12-18 DIAGNOSIS — E1021 Type 1 diabetes mellitus with diabetic nephropathy: Secondary | ICD-10-CM | POA: Diagnosis present

## 2015-12-18 DIAGNOSIS — Z931 Gastrostomy status: Secondary | ICD-10-CM

## 2015-12-18 DIAGNOSIS — E1043 Type 1 diabetes mellitus with diabetic autonomic (poly)neuropathy: Secondary | ICD-10-CM | POA: Diagnosis present

## 2015-12-18 DIAGNOSIS — E1165 Type 2 diabetes mellitus with hyperglycemia: Secondary | ICD-10-CM

## 2015-12-18 DIAGNOSIS — E1065 Type 1 diabetes mellitus with hyperglycemia: Secondary | ICD-10-CM | POA: Diagnosis present

## 2015-12-18 DIAGNOSIS — G629 Polyneuropathy, unspecified: Secondary | ICD-10-CM | POA: Diagnosis present

## 2015-12-18 DIAGNOSIS — I440 Atrioventricular block, first degree: Secondary | ICD-10-CM | POA: Diagnosis present

## 2015-12-18 DIAGNOSIS — H269 Unspecified cataract: Secondary | ICD-10-CM | POA: Diagnosis present

## 2015-12-18 DIAGNOSIS — G40909 Epilepsy, unspecified, not intractable, without status epilepticus: Secondary | ICD-10-CM | POA: Diagnosis present

## 2015-12-18 DIAGNOSIS — E1022 Type 1 diabetes mellitus with diabetic chronic kidney disease: Secondary | ICD-10-CM | POA: Diagnosis present

## 2015-12-18 DIAGNOSIS — Z992 Dependence on renal dialysis: Secondary | ICD-10-CM

## 2015-12-18 DIAGNOSIS — Z833 Family history of diabetes mellitus: Secondary | ICD-10-CM

## 2015-12-18 DIAGNOSIS — Z8673 Personal history of transient ischemic attack (TIA), and cerebral infarction without residual deficits: Secondary | ICD-10-CM

## 2015-12-18 DIAGNOSIS — D649 Anemia, unspecified: Secondary | ICD-10-CM | POA: Diagnosis present

## 2015-12-18 LAB — COMPREHENSIVE METABOLIC PANEL
ALT (GPT): 13 U/L (ref 7–33)
AST (GOT): 18 U/L (ref 9–38)
Albumin: 3.9 g/dL (ref 3.5–5.2)
Alkaline Phosphatase (Total): 116 U/L — ABNORMAL HIGH (ref 25–100)
Anion Gap: 15 — ABNORMAL HIGH (ref 4–12)
Bilirubin (Total): 0.5 mg/dL (ref 0.2–1.3)
Calcium: 10.1 mg/dL (ref 8.9–10.2)
Carbon Dioxide, Total: 26 meq/L (ref 22–32)
Chloride: 87 meq/L — ABNORMAL LOW (ref 98–108)
Creatinine: 9.14 mg/dL — ABNORMAL HIGH (ref 0.38–1.02)
GFR, Calc, African American: 6 mL/min/{1.73_m2} — ABNORMAL LOW (ref >59)
GFR, Calc, European American: 5 mL/min/{1.73_m2} — ABNORMAL LOW (ref >59)
Glucose: 612 mg/dL (ref 62–125)
Potassium: 4.3 meq/L (ref 3.6–5.2)
Protein (Total): 7.2 g/dL (ref 6.0–8.2)
Sodium: 128 meq/L — ABNORMAL LOW (ref 135–145)
Urea Nitrogen: 66 mg/dL — ABNORMAL HIGH (ref 8–21)

## 2015-12-18 LAB — BLOOD GAS, VENOUS (NO ELECTROLYTES)
Base Excess, Blood, VEN: 0.3 meq/L (ref 0.0–3.0)
Bicarbonate, VEN: 26 meq/L (ref 23–27)
pCO2, VEN: 49 mmHg (ref 42–50)
pH, VEN: 7.34 (ref 7.32–7.40)
pO2, VEN: 39 mmHg (ref 35–40)

## 2015-12-18 LAB — CBC, DIFF
% Basophils: 0 %
% Eosinophils: 4 %
% Immature Granulocytes: 0 %
% Lymphocytes: 13 %
% Monocytes: 8 %
% Neutrophils: 75 %
Absolute Eosinophil Count: 0.32 10*3/uL (ref 0.00–0.50)
Absolute Lymphocyte Count: 1.02 10*3/uL (ref 1.00–4.80)
Basophils: 0.02 10*3/uL (ref 0.00–0.20)
Hematocrit: 33 % — ABNORMAL LOW (ref 36–45)
Hemoglobin: 11.4 g/dL — ABNORMAL LOW (ref 11.5–15.5)
Immature Granulocytes: 0.01 10*3/uL (ref 0.00–0.05)
MCH: 29.8 pg (ref 27.3–33.6)
MCHC: 35 g/dL (ref 32.2–36.5)
MCV: 85 fL (ref 81–98)
Monocytes: 0.59 10*3/uL (ref 0.00–0.80)
Neutrophils: 5.84 10*3/uL (ref 1.80–7.00)
Platelet Count: 226 10*3/uL (ref 150–400)
RBC: 3.82 10*6/uL (ref 3.80–5.00)
RDW-CV: 13.5 % (ref 11.6–14.4)
WBC: 7.8 10*3/uL (ref 4.30–10.00)

## 2015-12-18 LAB — LAB ADD ON ORDER

## 2015-12-18 LAB — MAGNESIUM: Magnesium: 2.7 mg/dL — ABNORMAL HIGH (ref 1.8–2.4)

## 2015-12-18 LAB — BETA HYDROXYBUTYRATE: Beta Hydroxybutyrate: 0.26 mmol/L (ref <0.30)

## 2015-12-18 LAB — GLUCOSE POC, HMC
Glucose (POC): 514 mg/dL (ref 62–125)
Glucose (POC): 463 mg/dL — ABNORMAL HIGH (ref 62–125)

## 2015-12-18 LAB — PHOSPHATE: Phosphate: 4.7 mg/dL — ABNORMAL HIGH (ref 2.5–4.5)

## 2015-12-18 LAB — PROTHROMBIN & PTT
Partial Thromboplastin Time: 27 s (ref 22–35)
Prothrombin INR: 1 (ref 0.8–1.3)
Prothrombin Time Patient: 12.9 s (ref 10.7–15.6)

## 2015-12-19 DIAGNOSIS — E1022 Type 1 diabetes mellitus with diabetic chronic kidney disease: Secondary | ICD-10-CM

## 2015-12-19 DIAGNOSIS — N186 End stage renal disease: Secondary | ICD-10-CM

## 2015-12-19 DIAGNOSIS — E8770 Fluid overload, unspecified: Secondary | ICD-10-CM

## 2015-12-19 DIAGNOSIS — E1065 Type 1 diabetes mellitus with hyperglycemia: Secondary | ICD-10-CM

## 2015-12-19 DIAGNOSIS — Z992 Dependence on renal dialysis: Secondary | ICD-10-CM

## 2015-12-19 DIAGNOSIS — D649 Anemia, unspecified: Secondary | ICD-10-CM

## 2015-12-19 DIAGNOSIS — I10 Essential (primary) hypertension: Secondary | ICD-10-CM

## 2015-12-19 DIAGNOSIS — Z794 Long term (current) use of insulin: Secondary | ICD-10-CM

## 2015-12-19 DIAGNOSIS — I6789 Other cerebrovascular disease: Secondary | ICD-10-CM

## 2015-12-19 LAB — LIPID PANEL
Cholesterol (LDL): 77 mg/dL (ref <130)
Cholesterol/HDL Ratio: 3.5
HDL Cholesterol: 50 mg/dL (ref >39)
Non-HDL Cholesterol: 127 mg/dL (ref 0–159)
Total Cholesterol: 177 mg/dL (ref <200)
Triglyceride: 251 mg/dL — ABNORMAL HIGH (ref <150)

## 2015-12-19 LAB — LAB ADD ON ORDER

## 2015-12-19 LAB — GLUCOSE POC, HMC
Glucose (POC): 319 mg/dL — ABNORMAL HIGH (ref 62–125)
Glucose (POC): 131 mg/dL — ABNORMAL HIGH (ref 62–125)
Glucose (POC): 117 mg/dL (ref 62–125)
Glucose (POC): 96 mg/dL (ref 62–125)
Glucose (POC): 99 mg/dL (ref 62–125)
Glucose (POC): 94 mg/dL (ref 62–125)
Glucose (POC): 77 mg/dL (ref 62–125)
Glucose (POC): 97 mg/dL (ref 62–125)
Glucose (POC): 118 mg/dL (ref 62–125)
Glucose (POC): 109 mg/dL (ref 62–125)
Glucose (POC): 96 mg/dL (ref 62–125)

## 2015-12-19 LAB — CBC (HEMOGRAM)
Hematocrit: 27 % — ABNORMAL LOW (ref 36–45)
Hemoglobin: 9.2 g/dL — ABNORMAL LOW (ref 11.5–15.5)
MCH: 29.3 pg (ref 27.3–33.6)
MCHC: 34.7 g/dL (ref 32.2–36.5)
MCV: 84 fL (ref 81–98)
Platelet Count: 191 10*3/uL (ref 150–400)
RBC: 3.14 10*6/uL — ABNORMAL LOW (ref 3.80–5.00)
RDW-CV: 13.6 % (ref 11.6–14.4)
WBC: 6.88 10*3/uL (ref 4.30–10.00)

## 2015-12-19 LAB — BASIC METABOLIC PANEL
Anion Gap: 12 (ref 4–12)
Calcium: 9.4 mg/dL (ref 8.9–10.2)
Carbon Dioxide, Total: 28 meq/L (ref 22–32)
Chloride: 93 meq/L — ABNORMAL LOW (ref 98–108)
Creatinine: 9.91 mg/dL — ABNORMAL HIGH (ref 0.38–1.02)
GFR, Calc, African American: 6 mL/min/{1.73_m2} — ABNORMAL LOW (ref >59)
GFR, Calc, European American: 5 mL/min/{1.73_m2} — ABNORMAL LOW (ref >59)
Glucose: 179 mg/dL — ABNORMAL HIGH (ref 62–125)
Potassium: 3.9 meq/L (ref 3.6–5.2)
Sodium: 133 meq/L — ABNORMAL LOW (ref 135–145)
Urea Nitrogen: 70 mg/dL — ABNORMAL HIGH (ref 8–21)

## 2015-12-19 LAB — HEPATITIS B BATTERY (HBSAG W/RFLX PCR, ANTI-HBS, ANIT-HBC)
Hepatitis B Core Ab: NONREACTIVE
Hepatitis B Surf Antibody Intl Units: 33.04 [IU]
Hepatitis B Surface Ab: REACTIVE
Hepatitis B Surface Antigen w/Reflex: NONREACTIVE

## 2015-12-19 LAB — MAGNESIUM: Magnesium: 2.7 mg/dL — ABNORMAL HIGH (ref 1.8–2.4)

## 2015-12-19 LAB — PHOSPHATE: Phosphate: 4.8 mg/dL — ABNORMAL HIGH (ref 2.5–4.5)

## 2015-12-19 LAB — HEMOGLOBIN A1C, HPLC: Hemoglobin A1C: 7.6 % — ABNORMAL HIGH (ref 4.0–6.0)

## 2015-12-20 ENCOUNTER — Other Ambulatory Visit: Payer: Self-pay | Admitting: Dermatology

## 2015-12-20 DIAGNOSIS — T82898A Other specified complication of vascular prosthetic devices, implants and grafts, initial encounter: Secondary | ICD-10-CM

## 2015-12-20 DIAGNOSIS — R918 Other nonspecific abnormal finding of lung field: Secondary | ICD-10-CM

## 2015-12-20 LAB — CBC (HEMOGRAM)
Hematocrit: 32 % — ABNORMAL LOW (ref 36–45)
Hemoglobin: 10.8 g/dL — ABNORMAL LOW (ref 11.5–15.5)
MCH: 29.6 pg (ref 27.3–33.6)
MCHC: 34.2 g/dL (ref 32.2–36.5)
MCV: 87 fL (ref 81–98)
Platelet Count: 209 10*3/uL (ref 150–400)
RBC: 3.65 10*6/uL — ABNORMAL LOW (ref 3.80–5.00)
RDW-CV: 13.9 % (ref 11.6–14.4)
WBC: 6 10*3/uL (ref 4.30–10.00)

## 2015-12-20 LAB — BASIC METABOLIC PANEL
Anion Gap: 9 (ref 4–12)
Calcium: 9 mg/dL (ref 8.9–10.2)
Carbon Dioxide, Total: 29 meq/L (ref 22–32)
Chloride: 94 meq/L — ABNORMAL LOW (ref 98–108)
Creatinine: 5.89 mg/dL — ABNORMAL HIGH (ref 0.38–1.02)
GFR, Calc, African American: 10 mL/min/{1.73_m2} — ABNORMAL LOW (ref >59)
GFR, Calc, European American: 8 mL/min/{1.73_m2} — ABNORMAL LOW (ref >59)
Glucose: 263 mg/dL — ABNORMAL HIGH (ref 62–125)
Potassium: 4 meq/L (ref 3.6–5.2)
Sodium: 132 meq/L — ABNORMAL LOW (ref 135–145)
Urea Nitrogen: 24 mg/dL — ABNORMAL HIGH (ref 8–21)

## 2015-12-20 LAB — GLUCOSE POC, HMC
Glucose (POC): 156 mg/dL — ABNORMAL HIGH (ref 62–125)
Glucose (POC): 172 mg/dL — ABNORMAL HIGH (ref 62–125)
Glucose (POC): 243 mg/dL — ABNORMAL HIGH (ref 62–125)
Glucose (POC): 265 mg/dL — ABNORMAL HIGH (ref 62–125)
Glucose (POC): 55 mg/dL — ABNORMAL LOW (ref 62–125)
Glucose (POC): 62 mg/dL (ref 62–125)
Glucose (POC): 96 mg/dL (ref 62–125)

## 2015-12-20 LAB — LAB ADD ON ORDER

## 2015-12-20 LAB — PHOSPHATE: Phosphate: 3.2 mg/dL (ref 2.5–4.5)

## 2015-12-20 LAB — PARATHYROID HORMONE: Parathyroid Hormone: 70 pg/mL (ref 12–88)

## 2015-12-20 LAB — MAGNESIUM: Magnesium: 2.2 mg/dL (ref 1.8–2.4)

## 2015-12-21 DIAGNOSIS — E10649 Type 1 diabetes mellitus with hypoglycemia without coma: Secondary | ICD-10-CM

## 2015-12-21 LAB — BASIC METABOLIC PANEL
Anion Gap: 10 (ref 4–12)
Calcium: 9.4 mg/dL (ref 8.9–10.2)
Carbon Dioxide, Total: 30 meq/L (ref 22–32)
Chloride: 92 meq/L — ABNORMAL LOW (ref 98–108)
Creatinine: 4.55 mg/dL — ABNORMAL HIGH (ref 0.38–1.02)
GFR, Calc, African American: 14 mL/min/{1.73_m2} — ABNORMAL LOW (ref >59)
GFR, Calc, European American: 11 mL/min/{1.73_m2} — ABNORMAL LOW (ref >59)
Glucose: 198 mg/dL — ABNORMAL HIGH (ref 62–125)
Potassium: 4.2 meq/L (ref 3.6–5.2)
Sodium: 132 meq/L — ABNORMAL LOW (ref 135–145)
Urea Nitrogen: 14 mg/dL (ref 8–21)

## 2015-12-21 LAB — GLUCOSE POC, HMC
Glucose (POC): 102 mg/dL (ref 62–125)
Glucose (POC): 123 mg/dL (ref 62–125)
Glucose (POC): 154 mg/dL — ABNORMAL HIGH (ref 62–125)
Glucose (POC): 179 mg/dL — ABNORMAL HIGH (ref 62–125)
Glucose (POC): 266 mg/dL — ABNORMAL HIGH (ref 62–125)
Glucose (POC): 368 mg/dL — ABNORMAL HIGH (ref 62–125)

## 2015-12-21 LAB — CBC (HEMOGRAM)
Hematocrit: 35 % — ABNORMAL LOW (ref 36–45)
Hemoglobin: 11.8 g/dL (ref 11.5–15.5)
MCH: 29.6 pg (ref 27.3–33.6)
MCHC: 33.4 g/dL (ref 32.2–36.5)
MCV: 89 fL (ref 81–98)
Platelet Count: 265 10*3/uL (ref 150–400)
RBC: 3.98 10*6/uL (ref 3.80–5.00)
RDW-CV: 13.9 % (ref 11.6–14.4)
WBC: 8.49 10*3/uL (ref 4.30–10.00)

## 2015-12-21 LAB — MAGNESIUM: Magnesium: 2.2 mg/dL (ref 1.8–2.4)

## 2015-12-21 LAB — PHOSPHATE: Phosphate: 3.3 mg/dL (ref 2.5–4.5)

## 2015-12-22 ENCOUNTER — Other Ambulatory Visit: Payer: Self-pay | Admitting: Internal Medicine

## 2015-12-22 DIAGNOSIS — I120 Hypertensive chronic kidney disease with stage 5 chronic kidney disease or end stage renal disease: Secondary | ICD-10-CM

## 2015-12-22 DIAGNOSIS — J9811 Atelectasis: Secondary | ICD-10-CM

## 2015-12-22 LAB — BASIC METABOLIC PANEL
Anion Gap: 8 (ref 4–12)
Calcium: 9.2 mg/dL (ref 8.9–10.2)
Carbon Dioxide, Total: 30 meq/L (ref 22–32)
Chloride: 96 meq/L — ABNORMAL LOW (ref 98–108)
Creatinine: 6.42 mg/dL — ABNORMAL HIGH (ref 0.38–1.02)
GFR, Calc, African American: 9 mL/min/{1.73_m2} — ABNORMAL LOW (ref >59)
GFR, Calc, European American: 8 mL/min/{1.73_m2} — ABNORMAL LOW (ref >59)
Glucose: 200 mg/dL — ABNORMAL HIGH (ref 62–125)
Potassium: 4.4 meq/L (ref 3.6–5.2)
Sodium: 134 meq/L — ABNORMAL LOW (ref 135–145)
Urea Nitrogen: 26 mg/dL — ABNORMAL HIGH (ref 8–21)

## 2015-12-22 LAB — HEPATITIS C AB WITH REFLEX PCR: Hepatitis C Antibody w/Rflx PCR: NONREACTIVE

## 2015-12-22 LAB — CBC (HEMOGRAM)
Hematocrit: 32 % — ABNORMAL LOW (ref 36–45)
Hemoglobin: 10.3 g/dL — ABNORMAL LOW (ref 11.5–15.5)
MCH: 28.9 pg (ref 27.3–33.6)
MCHC: 32.5 g/dL (ref 32.2–36.5)
MCV: 89 fL (ref 81–98)
Platelet Count: 191 10*3/uL (ref 150–400)
RBC: 3.57 10*6/uL — ABNORMAL LOW (ref 3.80–5.00)
RDW-CV: 13.7 % (ref 11.6–14.4)
WBC: 8.32 10*3/uL (ref 4.30–10.00)

## 2015-12-22 LAB — HEMOGLOBIN A1C, HPLC: Hemoglobin A1C: 7.2 % — ABNORMAL HIGH (ref 4.0–6.0)

## 2015-12-22 LAB — GLUCOSE POC, HMC
Glucose (POC): 109 mg/dL (ref 62–125)
Glucose (POC): 146 mg/dL — ABNORMAL HIGH (ref 62–125)
Glucose (POC): 163 mg/dL — ABNORMAL HIGH (ref 62–125)
Glucose (POC): 227 mg/dL — ABNORMAL HIGH (ref 62–125)

## 2015-12-22 LAB — MAGNESIUM: Magnesium: 2.2 mg/dL (ref 1.8–2.4)

## 2015-12-22 LAB — PHOSPHATE: Phosphate: 4 mg/dL (ref 2.5–4.5)

## 2015-12-23 ENCOUNTER — Other Ambulatory Visit: Payer: Self-pay | Admitting: Internal Medicine

## 2015-12-23 ENCOUNTER — Telehealth (HOSPITAL_BASED_OUTPATIENT_CLINIC_OR_DEPARTMENT_OTHER): Payer: Self-pay

## 2015-12-23 DIAGNOSIS — I770 Arteriovenous fistula, acquired: Secondary | ICD-10-CM

## 2015-12-23 DIAGNOSIS — I440 Atrioventricular block, first degree: Secondary | ICD-10-CM

## 2015-12-23 DIAGNOSIS — G9349 Other encephalopathy: Secondary | ICD-10-CM

## 2015-12-23 LAB — BASIC METABOLIC PANEL
Anion Gap: 8 (ref 4–12)
Calcium: 8.6 mg/dL — ABNORMAL LOW (ref 8.9–10.2)
Carbon Dioxide, Total: 29 meq/L (ref 22–32)
Chloride: 97 meq/L — ABNORMAL LOW (ref 98–108)
Creatinine: 4.45 mg/dL — ABNORMAL HIGH (ref 0.38–1.02)
GFR, Calc, African American: 14 mL/min/{1.73_m2} — ABNORMAL LOW (ref >59)
GFR, Calc, European American: 12 mL/min/{1.73_m2} — ABNORMAL LOW (ref >59)
Glucose: 422 mg/dL — ABNORMAL HIGH (ref 62–125)
Potassium: 4.7 meq/L (ref 3.6–5.2)
Sodium: 134 meq/L — ABNORMAL LOW (ref 135–145)
Urea Nitrogen: 26 mg/dL — ABNORMAL HIGH (ref 8–21)

## 2015-12-23 LAB — CBC (HEMOGRAM)
Hematocrit: 29 % — ABNORMAL LOW (ref 36–45)
Hemoglobin: 9.3 g/dL — ABNORMAL LOW (ref 11.5–15.5)
MCH: 29.1 pg (ref 27.3–33.6)
MCHC: 32.6 g/dL (ref 32.2–36.5)
MCV: 89 fL (ref 81–98)
Platelet Count: 203 10*3/uL (ref 150–400)
RBC: 3.2 10*6/uL — ABNORMAL LOW (ref 3.80–5.00)
RDW-CV: 13.6 % (ref 11.6–14.4)
WBC: 7.51 10*3/uL (ref 4.30–10.00)

## 2015-12-23 LAB — LAB ADD ON ORDER

## 2015-12-23 LAB — GLUCOSE POC, HMC
Glucose (POC): 108 mg/dL (ref 62–125)
Glucose (POC): 131 mg/dL — ABNORMAL HIGH (ref 62–125)
Glucose (POC): 136 mg/dL — ABNORMAL HIGH (ref 62–125)
Glucose (POC): 262 mg/dL — ABNORMAL HIGH (ref 62–125)
Glucose (POC): 290 mg/dL — ABNORMAL HIGH (ref 62–125)
Glucose (POC): 407 mg/dL — ABNORMAL HIGH (ref 62–125)

## 2015-12-23 LAB — PHOSPHATE: Phosphate: 2.7 mg/dL (ref 2.5–4.5)

## 2015-12-23 LAB — MAGNESIUM: Magnesium: 1.9 mg/dL (ref 1.8–2.4)

## 2015-12-23 LAB — TROPONIN_I
Troponin_I Interpretation: NORMAL
Troponin_I: <0.03 ng/mL (ref <0.04)

## 2015-12-23 NOTE — Telephone Encounter (Signed)
Called and spoke with [facility] and confirmed site where patient was receiving dialysis in [place].    Phone:  [phone]

## 2015-12-23 NOTE — Telephone Encounter (Signed)
Met with Judeen Hammans, CCN on [facility].  Obtained signed records release from patient and spoke with [place], at [facility] [phone]).  FAX is [phone].    Alison Murray is gathering records to [place] to [employer] .     Inpatient Team planning discharge for 12/24/15.  Pt. Lives in [place].    Once [facility] records received, will fax to [place] for dialysis assignment.

## 2015-12-24 ENCOUNTER — Encounter (HOSPITAL_BASED_OUTPATIENT_CLINIC_OR_DEPARTMENT_OTHER): Payer: Self-pay

## 2015-12-24 ENCOUNTER — Telehealth (HOSPITAL_BASED_OUTPATIENT_CLINIC_OR_DEPARTMENT_OTHER): Payer: Self-pay | Admitting: Unknown Physician Specialty

## 2015-12-24 DIAGNOSIS — H539 Unspecified visual disturbance: Secondary | ICD-10-CM

## 2015-12-24 DIAGNOSIS — R404 Transient alteration of awareness: Secondary | ICD-10-CM

## 2015-12-24 LAB — CBC (HEMOGRAM)
Hematocrit: 29 % — ABNORMAL LOW (ref 36–45)
Hemoglobin: 9.6 g/dL — ABNORMAL LOW (ref 11.5–15.5)
MCH: 29.4 pg (ref 27.3–33.6)
MCHC: 33.1 g/dL (ref 32.2–36.5)
MCV: 89 fL (ref 81–98)
Platelet Count: 218 10*3/uL (ref 150–400)
RBC: 3.27 10*6/uL — ABNORMAL LOW (ref 3.80–5.00)
RDW-CV: 13.5 % (ref 11.6–14.4)
WBC: 7.56 10*3/uL (ref 4.30–10.00)

## 2015-12-24 LAB — VITAMIN B12 (COBALAMIN): Vitamin B12 (Cobalamin): 1283 pg/mL — ABNORMAL HIGH (ref 180–914)

## 2015-12-24 LAB — BASIC METABOLIC PANEL
Anion Gap: 11 (ref 4–12)
Calcium: 9.3 mg/dL (ref 8.9–10.2)
Carbon Dioxide, Total: 26 meq/L (ref 22–32)
Chloride: 97 meq/L — ABNORMAL LOW (ref 98–108)
Creatinine: 6.19 mg/dL — ABNORMAL HIGH (ref 0.38–1.02)
GFR, Calc, African American: 10 mL/min/{1.73_m2} — ABNORMAL LOW (ref >59)
GFR, Calc, European American: 8 mL/min/{1.73_m2} — ABNORMAL LOW (ref >59)
Glucose: 301 mg/dL — ABNORMAL HIGH (ref 62–125)
Potassium: 5.6 meq/L — ABNORMAL HIGH (ref 3.6–5.2)
Sodium: 134 meq/L — ABNORMAL LOW (ref 135–145)
Urea Nitrogen: 47 mg/dL — ABNORMAL HIGH (ref 8–21)

## 2015-12-24 LAB — GLUCOSE POC, HMC
Glucose (POC): 357 mg/dL — ABNORMAL HIGH (ref 62–125)
Glucose (POC): 225 mg/dL — ABNORMAL HIGH (ref 62–125)
Glucose (POC): 91 mg/dL (ref 62–125)
Glucose (POC): 190 mg/dL — ABNORMAL HIGH (ref 62–125)

## 2015-12-24 LAB — TSH WITH REFLEXIVE FREE T4: TSH with Reflexive Free T4: 1.919 u[IU]/mL (ref 0.400–5.000)

## 2015-12-24 LAB — MAGNESIUM: Magnesium: 1.9 mg/dL (ref 1.8–2.4)

## 2015-12-24 LAB — PHOSPHATE: Phosphate: 3.2 mg/dL (ref 2.5–4.5)

## 2015-12-24 LAB — PREGNANCY (HCG), SERUM, QUANT: Pregnancy (HCG), SRM: 2 m[IU]/mL (ref <6)

## 2015-12-24 NOTE — Telephone Encounter (Addendum)
Call made to [facility] RN,confirmed receiving all necessary documents except patient care plan.Stated that they will work into looking for spots and dialysis location for patient today and will call us back.    Received the most recent care plan from [facility].Faxed to [facility],confirmed receipt of documents.

## 2015-12-24 NOTE — Progress Notes (Signed)
Outpatient Dialysis Plan    Received confirmation from [place] for outpatient dialysis facility placement:    Location: [facility] [address]                  [phone]    Start date:  Friday December 26, 2015  Arrival time: 28 (orientation/consent)  Dialysis time:  1800-2000 pm    HD Access:  LUE fistula  Nephrologist:  TBD    Expectations:  Transportation to and from dialysis unit will be coordinated by patient/caregiver.    Please, bring:  Picture ID   Insurance cards   List of medications or pill bottles   List of allergies   Discharge instructions if you are just getting out of the hospital   Glasses, hearing aid, walker, wheelchair, cane - if you use them

## 2015-12-25 DIAGNOSIS — Z9641 Presence of insulin pump (external) (internal): Secondary | ICD-10-CM

## 2015-12-25 LAB — CBC (HEMOGRAM)
Hematocrit: 30 % — ABNORMAL LOW (ref 36–45)
Hemoglobin: 9.9 g/dL — ABNORMAL LOW (ref 11.5–15.5)
MCH: 29.8 pg (ref 27.3–33.6)
MCHC: 33.2 g/dL (ref 32.2–36.5)
MCV: 90 fL (ref 81–98)
Platelet Count: 244 10*3/uL (ref 150–400)
RBC: 3.32 10*6/uL — ABNORMAL LOW (ref 3.80–5.00)
RDW-CV: 13.7 % (ref 11.6–14.4)
WBC: 7.47 10*3/uL (ref 4.30–10.00)

## 2015-12-25 LAB — BASIC METABOLIC PANEL
Anion Gap: 8 (ref 4–12)
Calcium: 9 mg/dL (ref 8.9–10.2)
Carbon Dioxide, Total: 30 meq/L (ref 22–32)
Chloride: 96 meq/L — ABNORMAL LOW (ref 98–108)
Creatinine: 4.44 mg/dL — ABNORMAL HIGH (ref 0.38–1.02)
GFR, Calc, African American: 14 mL/min/{1.73_m2} — ABNORMAL LOW (ref >59)
GFR, Calc, European American: 12 mL/min/{1.73_m2} — ABNORMAL LOW (ref >59)
Glucose: 312 mg/dL — ABNORMAL HIGH (ref 62–125)
Potassium: 4.8 meq/L (ref 3.6–5.2)
Sodium: 134 meq/L — ABNORMAL LOW (ref 135–145)
Urea Nitrogen: 33 mg/dL — ABNORMAL HIGH (ref 8–21)

## 2015-12-25 LAB — MAGNESIUM: Magnesium: 1.9 mg/dL (ref 1.8–2.4)

## 2015-12-25 LAB — PHOSPHATE: Phosphate: 2.7 mg/dL (ref 2.5–4.5)

## 2015-12-25 LAB — GLUCOSE POC, HMC
Glucose (POC): 323 mg/dL — ABNORMAL HIGH (ref 62–125)
Glucose (POC): 259 mg/dL — ABNORMAL HIGH (ref 62–125)

## 2015-12-25 NOTE — Progress Notes (Signed)
Call received from [facility] talked to [facility].Stated that patient was last seen by Dr.Brockenbough in Aug 2016.It will be either Dr.Brockenbouhg or Dr.Pham to take over but cant confirm.Will call us back .

## 2015-12-26 ENCOUNTER — Encounter (HOSPITAL_BASED_OUTPATIENT_CLINIC_OR_DEPARTMENT_OTHER): Payer: Self-pay

## 2015-12-30 ENCOUNTER — Telehealth (HOSPITAL_BASED_OUTPATIENT_CLINIC_OR_DEPARTMENT_OTHER): Payer: Self-pay

## 2015-12-30 DIAGNOSIS — N186 End stage renal disease: Secondary | ICD-10-CM

## 2015-12-30 DIAGNOSIS — Z992 Dependence on renal dialysis: Secondary | ICD-10-CM

## 2016-01-01 NOTE — Telephone Encounter (Signed)
Re:  Nephrology Transfer of Care    Patient has been referred to [facility].    -----------------------------------------------------------------------  Transfer of care to [facility] 12/30/15  [phone] Fax:[phone]    Dialysis Location: [facility] [place] [address]  [phone]    Start date: Friday December 26, 2015  Arrival time: 1600 (orientation/consent)  Dialysis time: 1800-2000 pm  HD Access: LUE fistula

## 2016-01-02 NOTE — Telephone Encounter (Signed)
Received message from [place]: Patient wants to see Dr.Oliver at [facility] but doesn't know if that MD is taking new patients. She wants to keep her appt with Dr. Larene Beach for the time being.     However, patient is no longer seen on [place] [facility].  Call made to [facility] - confirmed that patient is under their care, she was seen by their provider previously.

## 2016-01-20 ENCOUNTER — Ambulatory Visit (HOSPITAL_BASED_OUTPATIENT_CLINIC_OR_DEPARTMENT_OTHER): Payer: Medicare Other | Admitting: Ophthalmology

## 2016-01-20 ENCOUNTER — Telehealth (HOSPITAL_BASED_OUTPATIENT_CLINIC_OR_DEPARTMENT_OTHER): Payer: Self-pay | Admitting: Internal Medicine

## 2016-01-20 ENCOUNTER — Ambulatory Visit: Payer: Self-pay

## 2016-01-20 DIAGNOSIS — I6783 Posterior reversible encephalopathy syndrome: Secondary | ICD-10-CM

## 2016-01-20 NOTE — Telephone Encounter (Signed)
Paged by Izora Gala, RN in GI clinic. Patient needs referral to IR to have G tube removed. Referral placed.

## 2016-01-20 NOTE — Patient Instructions (Signed)
Thank you for coming in today.  During today's visit we reviewed only your ophthalmology (eye-related) medications.  Please follow up with your primary care provider for any questions regarding other medications.    If your eyes were dilated during today's visit, the average dilation will last 4 to 6 hours and may impact your overall vision during this period. Please use caution during this period.    If you need to schedule or change a follow up appointment, please call [phone].  Our phone lines are open 7:00am to 8:00pm Monday through Saturdays and 9:00am to 5:30pm on Sundays.

## 2016-01-20 NOTE — Telephone Encounter (Signed)
The patient was admitted to [facility] from 6/15 to 6/22 two days after arriving from [place] needing dialysis.    From Dr. Lamont Snowball admission note of 12/18/15   Audrey Bullock presents to the [facility] ED requiring dialysis, as she last recieved her dialysis 2 days ago on Tuesday. She recieves her dialysis [facility], and says that she sometimes has [facility] . 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 She was hospitalized after she suddenly collapsed while walking on the street, and ultimately diagnosed with a CVA, PRES (though per notes there was an MRI done that showed no bleed or infarct), L>R UE and LE deficits and blindness, requiring G tube placement for feeds, with her course complicated by respiratory failure requiring intubation, aspiuration pneumonia, and difficult to control HTN on 5+ antihypertensives. Since her initial hospitalization she thinks her strength is improving, and now is able to take in PO.     Had a G tube in place, no longer needing as she is able to swallow on her own and meet her nutritional needs.Discussed with IR and G tube needs to stay in place for 4-6 weeks to allow tract to mature, it has been in for about 3-4 weeks and so should wait until late July for removal.    The mother called to arrange the G tube removal. Dr.Monar at [facility] was paged. She will place the referral to [facility] IR who will contact the patient. The mother understands the plan.    Patric Dykes, RN, BSN-BC

## 2016-01-20 NOTE — Telephone Encounter (Signed)
Not a [facility] patient.

## 2016-01-20 NOTE — Telephone Encounter (Signed)
Calling to schedule appt     CLINIC:  Please follow up with pt today.

## 2016-01-29 ENCOUNTER — Ambulatory Visit: Payer: Self-pay

## 2016-01-29 NOTE — Telephone Encounter (Signed)
Regarding: CB 2nd missed call. U[facility] Pt -  g tube  Pt - mom noticed that the cap from the tube is missing, would   ----- Message from Maureen Ralphs sent at 01/29/2016 12:53 PM PDT -----  CB 2nd missed call. [facility] Pt -  g tube  Pt - mom noticed that the cap from the tube is missing, would like to know what to do

## 2016-01-29 NOTE — Telephone Encounter (Signed)
Spoke with Judson Roch in Aftercare clinic she will f/u in helping pt establish primary care at [facility] or [place] if she does not already have a PCP. Emailed Dr. Vernona Rieger requesting referral for G tube removal via IR per the d/c plan from June and a referral for Aftercare.    Darel Hong RN 01/29/2016

## 2016-01-29 NOTE — Progress Notes (Signed)
The patient left without being seen.

## 2016-01-29 NOTE — Telephone Encounter (Addendum)
Mom calling again and says having problems with her phone. They are not using the G tube so no emergency. I said I would send a note to the [facility] clinic also as pt.says she has spoken to them re the G tube.

## 2016-01-29 NOTE — Telephone Encounter (Addendum)
Reason for Disposition  . Normal feeding tube function (all triage questions negative)    Protocols used: FEEDING TUBE SYMPTOMS AND QUESTIONS-ADULT-AH  Mom says pt.is eating and doesn't even use the G tube. Has about one more week that they were going to leave in. Mom says they were going to leave in for 6 weeks. There is some leaking without the cap.Says they have not been to [facility] yet. Says not until the 7th. Since our Endocrinology consulted I said I would send a note to that clinic nurse pool to see if they have an idea.Mom also is going to call Carlos RN who has not been there yet.  [facility]. Will send note for them to call. When I called received voicemail that only let me send a "SMF" notification =my call back number.

## 2016-01-29 NOTE — Telephone Encounter (Signed)
Regarding: CB: Missed call- [facility] Pt -  g tube  Pt - mom noticed that the cap from the tube is missing, would lik  ----- Message from Maureen Ralphs sent at 01/29/2016 12:16 PM PDT -----  CB: Missed call- [facility] Pt -  g tube  Pt - mom noticed that the cap from the tube is missing, would like to know what to do

## 2016-01-29 NOTE — Telephone Encounter (Signed)
Regarding: [facility] Pt -  g tube  Pt - mom noticed that the cap from the tube is missing, would like to know what to  ----- Message from [place] sent at 01/29/2016 10:22 AM PDT -----  U[facility] Pt -  g tube  Pt - mom noticed that the cap from the tube is missing, would like to know what to do

## 2016-02-02 NOTE — Telephone Encounter (Signed)
Message left on patient's cell number. [facility]care Clinic will take responsibility for assisting the patient in establishing with a primary care provider's office.

## 2016-02-04 NOTE — Telephone Encounter (Signed)
Spoke with pt's mother Jannette Fogo today. Pt has an aftercare apt made for 8/7. Pt will need referrals to GI to have feeding tube removed.

## 2016-02-09 ENCOUNTER — Encounter (HOSPITAL_BASED_OUTPATIENT_CLINIC_OR_DEPARTMENT_OTHER): Payer: Medicare Other

## 2016-02-16 ENCOUNTER — Encounter (HOSPITAL_BASED_OUTPATIENT_CLINIC_OR_DEPARTMENT_OTHER): Payer: Medicare Other | Admitting: Nephrology

## 2016-02-18 ENCOUNTER — Encounter (HOSPITAL_BASED_OUTPATIENT_CLINIC_OR_DEPARTMENT_OTHER): Payer: Medicare Other

## 2016-03-04 ENCOUNTER — Encounter (HOSPITAL_BASED_OUTPATIENT_CLINIC_OR_DEPARTMENT_OTHER): Payer: Self-pay

## 2016-03-10 ENCOUNTER — Encounter (HOSPITAL_BASED_OUTPATIENT_CLINIC_OR_DEPARTMENT_OTHER): Payer: Self-pay

## 2016-04-01 ENCOUNTER — Encounter (HOSPITAL_BASED_OUTPATIENT_CLINIC_OR_DEPARTMENT_OTHER): Payer: Medicare Other | Admitting: Registered Nurse

## 2016-04-12 ENCOUNTER — Encounter (HOSPITAL_BASED_OUTPATIENT_CLINIC_OR_DEPARTMENT_OTHER): Payer: Self-pay

## 2016-04-13 ENCOUNTER — Other Ambulatory Visit (HOSPITAL_BASED_OUTPATIENT_CLINIC_OR_DEPARTMENT_OTHER): Payer: Self-pay

## 2016-04-13 ENCOUNTER — Encounter (HOSPITAL_BASED_OUTPATIENT_CLINIC_OR_DEPARTMENT_OTHER): Payer: Medicare Other | Admitting: Registered"

## 2016-04-13 ENCOUNTER — Encounter (HOSPITAL_BASED_OUTPATIENT_CLINIC_OR_DEPARTMENT_OTHER): Payer: Medicare Other | Admitting: Registered Nurse

## 2016-04-13 ENCOUNTER — Encounter (HOSPITAL_BASED_OUTPATIENT_CLINIC_OR_DEPARTMENT_OTHER): Payer: Medicare Other

## 2016-04-13 ENCOUNTER — Encounter (HOSPITAL_BASED_OUTPATIENT_CLINIC_OR_DEPARTMENT_OTHER): Payer: Self-pay

## 2016-04-13 DIAGNOSIS — N186 End stage renal disease: Secondary | ICD-10-CM

## 2016-04-30 ENCOUNTER — Ambulatory Visit (HOSPITAL_BASED_OUTPATIENT_CLINIC_OR_DEPARTMENT_OTHER): Payer: Medicare Other | Attending: Ophthalmology | Admitting: Ophthalmology

## 2016-04-30 DIAGNOSIS — E119 Type 2 diabetes mellitus without complications: Secondary | ICD-10-CM

## 2016-04-30 DIAGNOSIS — H029 Unspecified disorder of eyelid: Secondary | ICD-10-CM

## 2016-04-30 DIAGNOSIS — H26493 Other secondary cataract, bilateral: Secondary | ICD-10-CM | POA: Insufficient documentation

## 2016-04-30 MED ORDER — PREDNISOLONE ACETATE 1 % OP SUSP
1.0000 [drp] | Freq: Four times a day (QID) | OPHTHALMIC | 2 refills | Status: DC
Start: 2016-04-30 — End: 2016-06-01

## 2016-04-30 MED ORDER — TIMOLOL MALEATE 0.5 % OP SOLN
1.0000 [drp] | Freq: Two times a day (BID) | OPHTHALMIC | 1 refills | Status: DC
Start: 2016-04-30 — End: 2016-06-01

## 2016-04-30 NOTE — Patient Instructions (Addendum)
Thank you for coming in today.  During today's visit we reviewed only your ophthalmology (eye-related) medications.  Please follow up with your primary care provider for any questions regarding other medications.    If your eyes were dilated during today's visit, the average dilation will last 4 to 6 hours and may impact your overall vision during this period. Please use caution during this period.    If you need to schedule or change a follow up appointment, please call [phone].  Our phone lines are open 7:00am to 8:00pm Monday through Saturdays and 9:00am to 5:30pm on Sundays.    Prednisolone left eye 4 times daily for 1 week    Timolol left eye 2 times daily until follow up

## 2016-04-30 NOTE — Progress Notes (Signed)
Chief complaint:     Chief Complaint   Patient presents with   . New Patient     Patient here for cataract exam. Patient had a stroke 11/2015 and va worsened.    . Other     BS avg 110-150       HPI:  Audrey Bullock is a 30 year old female who referred by Dr. Vernona Rieger for diabetic exam and cataract evaluation. 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 She was told that her cataracts came back and she was scheduled for procedure in March 2017 in [place], [place]. Moved back to [facility] in June 2017 to following stroke and vision loss. States has been poor and stable since hypertensive stroke in 11/2015, can only see colors and light. Prior to stroke, she she could still read street signs and read small print with glasses. Accompanied by step dad Audrey Bullock.     Hemoglobin A1C (%)   Date Value   12/19/2015 7.6 (H)       Broke ankle from fall down stairs due to difficulty seeing.     ROS: Complete review of systems performed, all negative.    Past Ocular History:  Pseudophakia OU 2007  Denies being told diabetic retinopathy  Denies ocular lasers or surgeries    Ocular Medications:   Denies    Past Medical History:    Past Medical History:   Diagnosis Date   . End stage renal disease    . 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 B Complex-C-Folic Acid (NEPHROCAPS) 1 MG Oral Cap      . Calcium Acetate 667 MG Oral Cap Take 2,668 mg by mouth.     . Insulin Lispro, Human, (HUMALOG) 100 UNIT/ML Subcutaneous Solution inject 6-10 units subcutaneously before meals if needed       No facility-administered medications prior to visit.    Aspirin - states not currently taking but told to take    Allergies:    NKDA    Family History:  Mother with glaucoma      Social History:   Social History     Social History   . Marital status:  Divorced     Spouse name: N/A   . Number of children: N/A   . Years of education: N/A     Occupational History   . Not on file.     Social History Main Topics   . Smoking status: Never Smoker   . Smokeless tobacco: Never Used   . Alcohol use No   . Drug use: No   . Sexual activity: Not on file     Other Topics Concern   . Not on file     Social History Narrative   . No narrative on file   Moved back from [place] in June 2017 after stroke  Biologic father in [place]  Lives in [place], [place] with mom, [place] and son age 53    PHYSICAL EXAM   Eyes: See Eye Exam   Constitutional: Well-appearing  Psychiatric: Mood normal   Neurologic: Face is symmetric   Skin: No facial rashes  Head: Atraumatic   ENT: External ears and nose are normal in appearance .   Respiratory:normal respiratory effort  MRI brain w/wo contrast 12/23/15  IMPRESSION:  On a background of cerebral and cerebellar volume loss, there are abnormal T2/FLAIR signal changes in a cortical/juxtacortical locale involving the bifrontal, biparietal, and bilateral occipital lobes. No associated mass effect. Nonspecific findings may represent subacute / chronic infarct versus PRES (as clinically indicated), among other etiologies. If available, a prior study would be helpful for comparison.    B-scan   No RT/RD OU    Assessment and Plan:    1. Pseudophakia with visually significant PCO OU  - visual potential may be limited by stroke  - recommend yag capsulotomy    2. Diabetes without obvious retinopathy OU  - difficult view, repeat DFE after treatment of above  - continue BS/BP control    3. History of R sided CVA 11/2015  - MRI with bilateral frontal, parietal and occipital lobe infarcts and cerebral/cerebellar volume loss  - likely responsible for vision loss  - unable to assess visual fields accurately given #1  - recommend HVF after #1 addressed    4.  Pigmented eyelid lesion OD  - likely  nevus  - longstanding and unchanged per patient  - monitor    Audrey Kitchens,  MD  PGY-3

## 2016-04-30 NOTE — Progress Notes (Signed)
I saw and evaluated the patient. I have reviewed the resident's documentation, please see attending note for complete plan:    ATTENDING NOTE:  Chief complaint: New Patient    HPI:  Audrey Bullock is a 30 year old female who referred by Dr. Vernona Rieger for diabetic exam and cataract evaluation. 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 She was told that her cataracts came back and she was scheduled for procedure in March 2017 in [place], [place]. Moved back to [facility] in June 2017 to following stroke and vision loss. States has been poor and stable since hypertensive stroke in 11/2015, can only see colors and light. Prior to stroke, she she could still read street signs and read small print with glasses. Accompanied by step dad [name].     ROS: Complete review of systems performed, all negative including no headaches    Past Ocular History:  Pseudophakia OU 2007  Denies being told diabetic retinopathy  Denies ocular lasers or surgeries    Ocular Medications:   Denies    PMH:    Past Medical History:   Diagnosis Date   . End stage renal disease    . Type 1 diabetes        Medications:    Outpatient Medications Prior to Visit   Medication Sig Dispense Refill   . B Complex-C-Folic Acid (NEPHROCAPS) 1 MG Oral Cap      . Calcium Acetate 667 MG Oral Cap Take 2,668 mg by mouth.     . Insulin Lispro, Human, (HUMALOG) 100 UNIT/ML Subcutaneous Solution inject 6-10 units subcutaneously before meals if needed       No facility-administered medications prior to visit.        ALL:    Review of patient's allergies indicates:  No Known Allergies    FH:  Family History     Problem (# of Occurrences) Relation (Name,Age of Onset)    Diabetes (1) Maternal Grandmother    No Known Problems (2) Mother, Father    Renal Disease (1) Maternal Grandmother           SH:   Social History     Social History   . Marital status: Divorced     Spouse name: N/A   . Number of children: N/A   . Years of education: N/A     Occupational History    . Not on file.     Social History Main Topics   . Smoking status: Never Smoker   . Smokeless tobacco: Never Used   . Alcohol use No   . Drug use: No   . Sexual activity: Not on file     Other Topics Concern   . Not on file     Social History Narrative   . No narrative on file       PHYSICAL EXAM   Eyes: See Eye Exam   Constitutional: Well-appearing  Psychiatric: Mood normal   Neurologic: Face is symmetric   Skin: No facial rashes  Head: Atraumatic   ENT: External ears and nose are normal in appearance .   Respiratory:normal respiratory effort     A/P:    1. Pseudophakia with visually significant PCO OU  - visual potential may be limited by stroke  - recommend yag capsulotomy left eye today    Patient here for Yag Capsulotomy. Long detailed discussion with patient. Risks/Benefits/Alternatives reviewed and questions answered. Patient desires laser.    Procedure: Yag Capsulotomy    Ocular  diagnosis: Posterior capsule opacity    Final verification:  Correct patient YES   Correct procedure YES   Correct eye marked YES   Consent signed YES  Date:  today     Prior exam notes were available and reviewed: Yes    Procedure:  Eye: Left  1 gtt Alphagan, 1 gtt phenylephrine, 1 gtt tropicamide Left eyes given pre-op  3.4 mJ, # 100   IOP 30 min post-op OD =  22    Complications: Incomplete yag cap given density and high energy needed to penetrate  Procedure performed by Mechele Claude C. Jeris Penta, MD  Assessment: Posterior capsule opacity    Plan:  Use 1 gtt PF four times per day for 1 week, add timolol left eye twice a day as well  CPM    2. H/o R sided CVA  MRI (12/23/15) IMPRESSION:  On a background of cerebral and cerebellar volume loss, there are abnormal   T2/FLAIR signal changes in a cortical/juxtacortical locale involving the   bifrontal, biparietal, and bilateral occipital lobes. No associated mass   effect. Nonspecific findings may represent subacute / chronic infarct versus   PRES (as clinically indicated), among other etiologies.  If available, a prior   study would be helpful for comparison.    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 Diabetes   - check for DR after yag cap    4.  Pigmented eyelid lesion OD  - likely  nevus  - longstanding and unchanged per patient  - monitor    RTC 2-3 weeks 4W for dilated fundus exam BOTH EYES, yag cap left eye (if cleared then RE), pt only able to come Tue/Thu because of dialysis M/W/F

## 2016-05-10 ENCOUNTER — Telehealth (HOSPITAL_BASED_OUTPATIENT_CLINIC_OR_DEPARTMENT_OTHER): Payer: Self-pay | Admitting: Ophthalmology

## 2016-05-10 NOTE — Telephone Encounter (Signed)
(  TEXTING IS AN OPTION FOR [facility] ONLY)  Is this a [place] clinic? No      RETURN CALL: OK to leave detailed message with anyone that answers      SUBJECT:  General Message     REASON FOR REQUEST: Patient doesn't know when she is supposed to be seen again.    MESSAGE: Please call patient at [phone] to advise when she needs to be seen again, and to schedule if needs to be seen soon.

## 2016-06-01 ENCOUNTER — Ambulatory Visit (HOSPITAL_BASED_OUTPATIENT_CLINIC_OR_DEPARTMENT_OTHER): Payer: Medicare Other | Attending: Ophthalmology | Admitting: Ophthalmology

## 2016-06-01 DIAGNOSIS — H26492 Other secondary cataract, left eye: Secondary | ICD-10-CM | POA: Insufficient documentation

## 2016-06-01 DIAGNOSIS — H26493 Other secondary cataract, bilateral: Secondary | ICD-10-CM | POA: Insufficient documentation

## 2016-06-01 HISTORY — PX: SURGICAL HX OTHER: 99

## 2016-06-01 MED ORDER — PREDNISOLONE ACETATE 1 % OP SUSP
1.0000 [drp] | Freq: Four times a day (QID) | OPHTHALMIC | 2 refills | Status: DC
Start: 2016-06-01 — End: 2016-08-10

## 2016-06-01 MED ORDER — TIMOLOL MALEATE 0.5 % OP SOLN
1.0000 [drp] | Freq: Two times a day (BID) | OPHTHALMIC | 1 refills | Status: DC
Start: 2016-06-01 — End: 2016-08-10

## 2016-06-01 NOTE — Patient Instructions (Signed)
Thank you for coming in today.  During today's visit we reviewed only your ophthalmology (eye-related) medications.  Please follow up with your primary care provider for any questions regarding other medications.    If your eyes were dilated during today's visit, the average dilation will last 4 to 6 hours and may impact your overall vision during this period. Please use caution during this period.    If you need to schedule or change a follow up appointment, please call [phone].  Our phone lines are open 7:00am to 8:00pm Monday through Saturdays and 9:00am to 5:30pm on Sundays.

## 2016-06-01 NOTE — Progress Notes (Signed)
Chief complaint: New Patient    HPI:  Audrey Bullock is a 30 year old female IDDM1 with ESRD on HD since 2012, stroke and bilateral vision loss in June 2017 who received YAG capsulotomy LE at last visit, returns for f/u. Accompanied by step dad [name]. Denies eye pain or discomfort. States left eye is able to see more shadows. Didn't stop PF after a week and still taking QID LE with timolol BID LE.  Left eye sees more shadows.    ROS: Complete review of systems performed, all negative including no headaches    Past Ocular History:  Pseudophakia OU 2007  Denies being told diabetic retinopathy  Denies ocular lasers or surgeries    Ocular Medications:   PF QID left eye  Timolol BID left eye    PMH:    Past Medical History:   Diagnosis Date   . End stage renal disease    . Type 1 diabetes        Medications:    Outpatient Medications Prior to Visit   Medication Sig Dispense Refill   . B Complex-C-Folic Acid (NEPHROCAPS) 1 MG Oral Cap      . Calcium Acetate 667 MG Oral Cap Take 2,668 mg by mouth.     . Insulin Lispro, Human, (HUMALOG) 100 UNIT/ML Subcutaneous Solution inject 6-10 units subcutaneously before meals if needed     . PrednisoLONE Acetate 1 % Ophthalmic Suspension Place 1 drop into left EYE every 6 hours. 1 bottle 2   . Timolol Maleate 0.5 % Ophthalmic Solution Place 1 drop into left EYE 2 times a day. 1 bottle 1     No facility-administered medications prior to visit.        ALL:    Review of patient's allergies indicates:  No Known Allergies    FH:  Family History     Problem (# of Occurrences) Relation (Name,Age of Onset)    Diabetes (1) Maternal Grandmother    No Known Problems (2) Mother, Father    Renal Disease (1) Maternal Grandmother           SH:   Social History     Social History   . Marital status: Divorced     Spouse name: N/A   . Number of children: N/A   . Years of education: N/A     Occupational History   . Not on file.     Social History Main Topics   . Smoking status: Never Smoker      . Smokeless tobacco: Never Used   . Alcohol use No   . Drug use: No   . Sexual activity: Not on file     Other Topics Concern   . Not on file     Social History Narrative   . No narrative on file       PHYSICAL EXAM   Eyes: See Eye Exam   Constitutional: Well-appearing  Psychiatric: Mood normal   Neurologic: Face is symmetric   Skin: No facial rashes  Head: Atraumatic   ENT: External ears and nose are normal in appearance .   Respiratory:normal respiratory effort     A/P:    1. Pseudophakia with visually significant PCO OU  - visual potential may be limited by stroke  - recommend repeat yag capsulotomy left eye today    Patient here for Yag Capsulotomy. Long detailed discussion with patient. Risks/Benefits/Alternatives reviewed and questions answered. Patient desires laser.    Procedure: Yag Capsulotomy  Ocular diagnosis: Posterior capsule opacity    Final verification:  Correct patient YES   Correct procedure YES   Correct eye marked YES   Consent signed YES  Date:  06/01/16     Prior exam notes were available and reviewed: Yes    Procedure:  Eye: Left  1 gtt proparacaine left eye + Abraham lens  42 mJ, # 74   IOP 30 min post-op OD =  22    Complications: None, possible remaining attachment superonasally at 11:00 but given multiple previous pits and concern for cracking IOL, did not further attempt in that area  Assessment: Posterior capsule opacity    Plan:  Use 1 gtt PF four times per day for 1 week, add timolol left eye twice a day as well  CPM    2. H/o R sided CVA  MRI (12/23/15) IMPRESSION:  On a background of cerebral and cerebellar volume loss, there are abnormal   T2/FLAIR signal changes in a cortical/juxtacortical locale involving the   bifrontal, biparietal, and bilateral occipital lobes. No associated mass   effect. Nonspecific findings may represent subacute / chronic infarct versus   PRES (as clinically indicated), among other etiologies. If available, a prior   study would be helpful for  comparison.    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 Diabetes   - check for DR after yag cap    4.  Pigmented eyelid lesion OD  - likely nevus  - longstanding and unchanged per patient  - monitor    RTC 2-3 weeks 4W for dilated fundus exam BOTH EYES, yag cap RE, pt only able to come Tue/Thu because of dialysis M/W/F    Seen with Dr. Charmian Muff, MD   PGY2 Ophthalmology

## 2016-06-08 NOTE — Progress Notes (Signed)
I did not see the patient with the resident but reviewed the findings and note.  I agree with the resident assessment and plan.    Date of Service: 06/01/2016    Zakia Sainato B. Quatavious Rossa, MD  Assistant Professor of Ophthalmology  Division of Oculoplastic and Orbital Surgery  Associate Residency Program Director  [employer]

## 2016-06-15 ENCOUNTER — Encounter (HOSPITAL_BASED_OUTPATIENT_CLINIC_OR_DEPARTMENT_OTHER): Payer: Self-pay

## 2016-06-22 ENCOUNTER — Ambulatory Visit (HOSPITAL_BASED_OUTPATIENT_CLINIC_OR_DEPARTMENT_OTHER): Payer: Medicare Other | Admitting: Radiation Oncology

## 2016-06-22 ENCOUNTER — Ambulatory Visit (HOSPITAL_BASED_OUTPATIENT_CLINIC_OR_DEPARTMENT_OTHER): Payer: Medicare Other | Admitting: Registered"

## 2016-06-22 ENCOUNTER — Ambulatory Visit: Payer: Medicare Other | Attending: Registered Nurse | Admitting: Registered Nurse

## 2016-06-22 ENCOUNTER — Other Ambulatory Visit (HOSPITAL_BASED_OUTPATIENT_CLINIC_OR_DEPARTMENT_OTHER): Payer: Self-pay

## 2016-06-22 VITALS — Ht 66.3 in | Wt 147.9 lb

## 2016-06-22 VITALS — BP 132/75 | HR 69 | Temp 98.4°F | Ht 66.3 in | Wt 150.6 lb

## 2016-06-22 DIAGNOSIS — Z6824 Body mass index (BMI) 24.0-24.9, adult: Secondary | ICD-10-CM

## 2016-06-22 DIAGNOSIS — E1022 Type 1 diabetes mellitus with diabetic chronic kidney disease: Secondary | ICD-10-CM

## 2016-06-22 DIAGNOSIS — E1021 Type 1 diabetes mellitus with diabetic nephropathy: Secondary | ICD-10-CM | POA: Insufficient documentation

## 2016-06-22 DIAGNOSIS — I6783 Posterior reversible encephalopathy syndrome: Secondary | ICD-10-CM | POA: Insufficient documentation

## 2016-06-22 DIAGNOSIS — N186 End stage renal disease: Secondary | ICD-10-CM

## 2016-06-22 DIAGNOSIS — Z01818 Encounter for other preprocedural examination: Secondary | ICD-10-CM

## 2016-06-22 NOTE — Progress Notes (Signed)
Transplant Nephrology - Return Visit Pretransplant Evaluation Clinic  Encounter Date: 06/22/2016   Accompanied By: This patient is accompanied by her young son.  Primary nephrologist: Corrie Mckusick  Transplant Nephrology Attending: Corwin Levins    Chief Complaint:     Micheline Maze presents to the [facility] for a revisit regarding her candidacy for kidney/pancreas transplantation, currently deferred pending completion of her workup.      History of Present Illness:    Rella Egelston is a 30 year old African American woman with a history of ESRD secondary to type 1 diabetes. She also has a history of hypertension and likely PRES.    Please see my note on 09/03/2014. 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 Her course was complicated by G tube placement for feeds, respiratory failure requiring intubation, aspiration pneumonia, and refractory hypertension.     She moved back to [place] afterward to stay with her mother. She was then hospitalized here at [facility] from 6/15-6/21/17 for bradycardia and hypertension and was given hemodialysis and restarted on her home blood pressure medicines and redosed on insulin after the tube feeds. She is now on Keppra for seizure disorder as well and has not had any seizures. She had the G tube removed last month and it's healing up fine. She states that her vision is slowly improving (she sees shadows), she can ambulate with a walker, and her left sided weakness has substantially improved.    She was scheduled to see [place] in October but could not come in due to an ankle fracture after falling down stairs s/p ORIF on 10/11. She states she has recovered well from this.  She is fully ambulatory with a boot and will see Ortho on Thursday.    Ms. Narula endorses a stable weight. She exercises with her PT three times a week and walks up and down stairs at home every other day. Her blood pressure is running 120s-140s.    Review of Systems -   She denies fevers, chills, chest pain, palpitations, shortness of breath, claudication, edema, anxiety. She's feeling a little down about her eyesight and she deals with this by praying which helps.    Renal history:   Dialysis start date & Modality: 04/06/11; in center HD 3x/week  Problems on dialysis: Numbness in arm and fingers but it's better if she wears a coat and gloves  Dry weight: 64 kg - remove 2 or less kg per session  She is anuric.  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 Hx of 1 blood transfusion.    Pregnancies: G 2 P 1    Past Surgical History:   Procedure Laterality Date   . CESAREAN DELIVERY ONLY  2011   . VASCULAR ACCESS SURGERY         Social History  She denies smoking, alcohol or illicit drug use.  She denies any changes in post-transplant caregiver status. She states that her mother and her aunt will be her caregivers.    Family History  Family History   Problem Relation Age of Onset   . No Known Problems Mother    . No  Known Problems Father    . Diabetes Maternal Grandmother    . Renal Disease Maternal Grandmother      She denies a family history of cardiovascular disease or cancer.    Current Outpatient Prescriptions   Medication Sig Dispense Refill   . Albuterol Sulfate HFA 108 (90 Base) MCG/ACT Inhalation Aero Soln Inhale 2 puffs by mouth.     Marland Kitchen AmLODIPine Besylate 10 MG Oral Tab Take 10 mg by mouth.     . CloNIDine HCl 0.1 MG Oral Tab Take 0.3 mg by mouth.     . HydrALAZINE HCl 100 MG Oral Tab Take 100 mg by mouth.     . Hydrocortisone 1 % External Cream      . Insulin Glargine (LANTUS SOLOSTAR) 100 UNIT/ML Subcutaneous Solution Pen-injector INJECT 15 UNITS INTO THE SKIN DAILY IN CASE OF PUMP FAILURE     . Insulin Lispro (HUMALOG) 100 UNIT/ML Subcutaneous Solution INJECT 6 TO 10 UNITS SUBCUTANEOUSLY BEFORE MEALS IF NEEDED     . LevETIRAcetam 500 MG Oral Tab Take 500 mg by mouth.     . Metoclopramide HCl 5 MG Oral Tab take 1 tablet by mouth three times a day     . OxyCODONE HCl 5 MG Oral Tab Take 5 mg by mouth.     . PrednisoLONE Acetate 1 % Ophthalmic Suspension Place 1 drop into left EYE every 6 hours. 1 bottle 2   . Promethazine-Codeine 6.25-10 MG/5ML Oral Syrup Take 5 mL by mouth.     . Sevelamer Carbonate 800 MG Oral Tab Take 1,600 mg by mouth.     . Simvastatin 20 MG Oral Tab Take 20 mg by mouth.     . Timolol Maleate 0.5 % Ophthalmic Solution Place 1 drop into left EYE 2 times a day. 1 bottle 1     No current facility-administered medications for this visit.        BP 132/75 Comment: post 5 mins  Pulse 69   Temp 98.4 F (36.9 C) (Temporal)   Ht 5' 6.3" (1.684 m)   Wt 150 lb 9.2 oz (68.3 kg)   SpO2 99% Comment: ra  BMI 24.08 kg/m   Estimated body mass index is 24.08 kg/m as calculated from the following:    Height as of this encounter: 5' 6.3" (1.684 m).    Weight as of this encounter: 150 lb 9.2 oz (68.3 kg).     Physical Exam:   Vitals: As above  General: 30 year old  African American woman in no acute  distress, sitting with a boot on the right foot and 4WW beside her.  HEENT: Blind. Anicteric sclerae, moist mucosa without erythema or exudate. Dentition without infection noted.  Neck: Supple, without lymphadenopathy. L carotid bruit or transmitted  fistula bruit. No R carotid bruit.  Lungs: Clear to auscultation, without wheeze or rales.  Cardiovascular: Regular rate and rhythm, without gallop or rub. 2/6 systolic murmur or transmitted fistula bruit.  Abdominal: Soft, nontender, and nondistended with normal active bowel sounds. No hepatosplenomegaly noted. Insulin pump in place.  Extremities: No clubbing, cyanosis. No edema bilaterally. Femoral pulses 2+ and without bruits. Radial and pedal pulses are 2+. No lower extremity discoloration or atrophic changes noted. There is a left upper extremity AV fistula that is intact with a normal bruit and thrill.   Neurologic: Grossly normal, without tremor. Alert & oriented x4.  Skin: Normal without rash.     RESULTS REVIEWED.  Orders Only on 06/22/16   1. SEROLOGIC SYPHILIS PANEL, SRM   Result Value Ref Range    Syphilis Igg Ab Result Negative NRN    Syphilis Igg Comment Confirmation testing not indicated.     Syphilis Screening Status Negative for syphilis IgG antibodies. SYPHN   2. Hepatitis A Immune Status IgG   Result Value Ref Range    Hepatitis A IgG Antibody Reactive     Hep A IgG Interpretation       Evidence of past Hepatitis A Virus (HAV) infection or immunization.   3. HEPATITIS B Battery (HBsAg w/rflx PCR, anti-HBs, anit-HBc)   Result Value Ref Range    Hepatitis B S Ag w/Rflx PCR Nonreactive NREAC    Hepatitis B Surface Ab Reactive     Hepatitis B Surf Antibody Intl Units 34.25 [IU]    Hepatitis B Core Ab Nonreactive NREAC    Hepatitis B Battery Interp       Evidence of past hepatitis B infection or immunization.   4. Hepatitis C Ab with REFLEX PCR   Result Value Ref Range    Hepatitis C Antibody w/Rflx PCR Nonreactive NREAC    Hepatitis C Antibody  Interpretation       No evidence of antibody to hepatitis C virus (HCV).  Anti-HCV may develop slowly (6 weeks - 6 months) over the course of hepatitis C virus infection.  Rarely it may be absent in cases of chronic   infection.  Anti-HCV may also disappear after recovery from acute, self-limited disease.         Myocardial Perfusion Scan: 07/09/15 Normal myocardial perfusion study.  Post stress: The ejection fraction was greater than 65%.  At rest: Global systolic function is normal.  No evidence for significant ischemia or scar is noted.    Echocardiogram: 07/09/15 1.  There is concentric left ventricular hypertrophy with normal chamber size and wall motion.  The ejection fraction rmal, estimated to be 60% or more.  2.  Right ventricular size and free wall motion are normal.  3.  Left atrium is enlarged.  4.  Right atrial size is normal.  5.  The aortic root and arch appeared normal within the field of view.  6.  Aortic valve has 3 cusps. There is thickening and focal echodensity consistent with calcification and/or fibrosis.  Systolic opening is reduced but there is no evidence of significant stenosis or insufficiency.  7.  The mitral valve leaflets are thin and normally mobile with no evidence of true prolapse.  Doppler evaluation demonstrates normal systolic and diastolic flow patterns.  8.  The tricuspid valve appears normal. The pulmonary artery pressure is high normal (36 mmHg) by Doppler calculation.  9.  The pulmonic valve appears normal.  The main pulmonary artery and proximal branches are of normal diameter.  10.  The inferior vena cava size is normal with normal respiratory viation.  11.  The pericardium is normal with no evidence of pericardial effusion or thickening.    Chest X-ray: 12/22/15 Cardiomegaly is present. No pneumothorax or pleural effusion. Mild basilar atelectasis. Bones show no acute abnormality.    Electrocardiogram: 04/11/16 SINUS RHYTHM WITH PROLONGED PR INTERVAL  LEFT ATRIAL  ENLARGEMENT  POSSIBLE LEFT VENTRICULAR HYPERTROPHY  MODERATE T-WAVE ABNORMALITY    Carotid Doppler: Yes, 03/23/13 0-15% bilaterally  Lower Extremity Doppler: Yes, 03/23/13 No significant stenosis    CT Abdomen/Pelvis w 10/15/15 1. Diffuse bilateral ground-glass infiltrates, nonspecific, new since 09/07/2015.Pulmonary edema or infection/ inflammation are in the Differential. 2. Cardiomegaly again seen with trace pericardial effusion.Borderline right cardiophrenic angle lymph node.Trace pericardial effusion. 3. Atrophic kidneys.Extensive vascular calcifications.Lack of excretion on the delayed images consistent with known renal failure.    CT Abdomen: Yes, 4 phase liver CT 04/20/13 1. Stable segment 4 hepatic lesion near the IVC. Rest of the arterial enhancing lesions seen on the previous study are not well visualized, likely due to difference in phases. These lesions likely represent adenomas /FNH. A followup Eovist enhanced MRI is suggested to differentiate between the two. 2. The two hypodense subcentimeter hepatic lesion seen on the previous study also stable and likely represent hemangiomas. 3. Mild atherosclerotic calcifications noted involving bilateral renal arteries, splenic artery and the SMA. No aneurysmal dilatation or stenosis. -- Per Dr. Dellia Cloud, the MRI would be useful to differentiate adenomas from [facility] but is not necessary to clear for transplant.    Cancer Screening: Pap smear 09/30/15 Report not available    Dental Clearance: Yes, 04/27/13  PRA:  0% 05/30/12; B+   Vaccinations: Hepatitis A 09/25/13, Hepatitis Bab+, prevnar 05/30/12, Tdap 05/30/12  Serologies: 05/30/12 CMV +, EBV +, HCV Ab -, HBsAg -, HBsAb +, HBcAb -, HAV Ab -, Quantiferon -    Other:  -EEG 12/24/15 This is an essentially normal EEG. However a normal EEG does not exclude the diagnosis of epilepsy.     -Brain MRI 12/23/15 On a background of cerebral and cerebellar volume loss, there are abnormal T2/FLAIR signal changes in a  cortical/juxtacortical locale involving the bifrontal, biparietal, and bilateral occipital lobes. No associated mass effect. Nonspecific findings may represent subacute / chronic infarct versus PRES (as clinically indicated), among other etiologies. If available, a prior study would be helpful for comparison.    -Thyroid US 10/15/15 Mildly enlarged thyroid gland with right lobe larger than left and  mild thickening of the isthmus. No suspicious nodules are seen.    -Coagulation Workup 09/17/15 Antiphospholipid Ab negative; Lupus anticoagulant negative; Factor V activity wnl; Antithrombin III activity 122 (H)      Impression and Plan:    Merilynn Haydu is a 30 year old African American woman with a history of ESRD secondary to type 1 diabetes.     Earney Mallet Bernardini's case will be discussed by the Transplant team in our weekly transplant meeting and a decision regarding candidacy, further workup and listing will be made at that time. I will recommend Maryrose Colvin as being an acceptable candidate for kidney and perhaps kidney/pancreas transplantation pending the following:    From a cardiovascular pre-transplant evaluation perspective I recommend yearly cardiac testing (due in January).    From a malignancy screening perspective obtain the 09/30/15 Pap smear report (she states it was normal).    From an infectious screening and risk evaluation perspective I recommend our routine serological  labs and vaccinations.    Recommend neurology evaluation for transplant clearance in setting of prior PRES/CVA as well as medication recommendations for seizure disorder.    Dixie  Transplant Nephrology  Division of Nephrology  [facility]  [place], [place]

## 2016-06-22 NOTE — Progress Notes (Signed)
Date: 06/22/2016  Referred by: Dr. Nelly Rout  Time spent with patient: 45 minutes  Reason for appointment: Transplant Nutrition Assessment and Education  Interpreter used:  No    PI:  Audrey Bullock is a 30 year old African American woman with a history of ESRD secondary to type 1 diabetes. She also has a history of hypertension and likely PRES.    Anthropometric Data:  Current wt: 68.3 kg-with boot and tennis shoes (boot weighs 1.2 kg)   54.3 kg 02/07/2016        Height: 168.4 cm-with shoes/boot  Body mass index is 23.66 kg/m.    WC-unable to measure  Ideal body weight: 59-66 kg    Weight for calculations: 64 kg  Dry weight: 64 kg    Patient interview/subjective:  Weight history: Stable weight  Dialysis: HD three times/week for 4 hours with 2 kg weight gain s between runs.  Tolerates well  Urine output: None  Outpatient injections received: Epo on occasion, Iron  Anorexia: No-always hungry    Taste Changes: No    Early satiety: NO   GI symptoms: None  Food allergies: None  Use of nutrition supplements: Protein supplement at HD three times/week   Chewing/Swallowing/Dental issues: None.  Initially had ng, then g tube S/P CVA.  Stopped using and removed once back in [place].  Needed soft textures initially once oral diet started but now tolerating all foods without issues  Current diet: Regular.  Step father does cooking and he limits salt in food preparation although some processed foods are used and consumed.  Keeps simple carbohydrates limited   Fluid intake: Water only with pills  Number of meals and/or snacks/day: 3 meals   Diet history:  Bacon, sausage patty, 1 egg and 1 slice of toast for breakfast or toast with hot cereal or grits  Sliced cheese and salami sandwich for lunch, occasional potato chips  Baked chicken, rice a roni, cor or other vegetables for dinner  Snacks on graham crackers when hungry  Meals consumed at restaurants, fast food/takeout: Once a month-[employer] or [employer]  Alcohol  intake: None  Smoking use: None      Recreational drug use: None   Energy level: Improving and feels like she's getting stronger   Activity: Can dress herself and showers with stand by assist from her mom.  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  5) Abnormal PFTs  6) CVA/PRES 11/2015  7) Ankle fracture s/p ORIF 04/2016  Labs: NA but patient reports phosphorus well controlled with binders     Nutrient Requirements: 1800-2000 kcal, 85-90 gm protein (65-70 gm coming from HBV sources),  750 ml fluid, 2000 mg sodium, 2000 mg potassium, 1000 mg phosphorus    Nutrition Assessment:  Evaluation of intake: Diet history reflects reasonable PO intake though will benefit from higher protein intake, consuming adequate calories and reducing sodium intake  Ability to meet nutrient requirements: Yes    Barriers: None  Evaluation of nutrition status: Moderate nutritional risk 2/2  disease process and co morbidities.  Improving  functional status and blood glucose appears on target with improving renal labs  Demonstrated understanding, willingness and ability to improve nutritional status: Yes    Education Provided:  Meeting Nutrient Requirements to Promote Anabolism.  Dietary sources of protein and ways to incorporate into meals and snacks  Renal Dietary Guidelines-focus on sodium.  Reduce use of high sodium processed meats and other foods  Use of Oral Supplements-carbohydrate reduced, high protein bars as a snack alternative  Written materials, recipes and on line resources provided: Yes      Demonstrated understanding of education: Yes    Barriers: None  Education was provided  taking into consideration the patient's learning abilities, readiness to learn and cultural beliefs    Goals of nutrition therapy:  Anabolism, Repletion of serum Alb, Adequate intake of all nutrients, Glucose Control    Plan:  Review of nutritional status with transplant team at selection conference   RD contact information provided to patient and son, Purcell Nails for follow up questions as they arise    Asencion Noble, [place]  Registered Dietitian  [phone]

## 2016-06-23 LAB — HEPATITIS A IMMUNE STATUS IGG: Hepatitis A IgG Antibody: REACTIVE

## 2016-06-23 LAB — HEPATITIS B BATTERY (HBSAG W/RFLX PCR, ANTI-HBS, ANIT-HBC)
Hepatitis B Core Ab: NONREACTIVE
Hepatitis B Surf Antibody Intl Units: 34.25 [IU]
Hepatitis B Surface Ab: REACTIVE
Hepatitis B Surface Antigen w/Reflex: NONREACTIVE

## 2016-06-23 LAB — HIV ANTIGEN AND ANTIBODY SCRN
HIV Antigen and Antibody Interpretation: NONREACTIVE
HIV Antigen and Antibody Result: NONREACTIVE

## 2016-06-23 LAB — HEPATITIS C AB WITH REFLEX PCR: Hepatitis C Antibody w/Rflx PCR: NONREACTIVE

## 2016-06-23 LAB — SEROLOGIC SYPHILIS PANEL, SRM
Syphilis Igg Ab Result: NEGATIVE
Syphilis Screening Status: NEGATIVE

## 2016-06-23 NOTE — Progress Notes (Signed)
Audrey Bullock is a 30 year old woman with ESRD secondary to type 1 diabetes, here for evaluation for kidney-pancreas transplant.  She has been on dialysis since October 2012.  She has not produced any urine for several years.  No history of kidney stones, no hematuria or dysuria.  No urinary incontinence.  She has had perhaps 2 urinary tract infections in her lifetime.  She has had type 1 diabetes since she was 30 years old.  No peripheral neuropathy.  She has retinopathy.  History of hypertension.  History of blood transfusion.    One pregnancy.  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 Her course was complicated by G tube placement for feeds, respiratory failure requiring intubation, aspiration pneumonia, and refractory hypertension.     She moved back to [place] afterward to stay with her mother. She was then hospitalized here at [facility] from 6/15-6/21/17 for bradycardia and hypertension and was given hemodialysis and restarted on her home blood pressure medicines and redosed on insulin after the tube feeds. She is now on Keppra for seizure disorder as well and has not had any seizures. She had the G tube removed last month and it's healing up fine. She states that her vision is slowly improving (she sees shadows), she can ambulate with a walker, and her left sided weakness has substantially improved.    She was scheduled to see [place] in October but could not come in due to an ankle fracture after falling down stairs s/p ORIF on 10/11. She states she has recovered well from this. She is fully ambulatory with a boot and will see Ortho  on Thursday."    Her medical history is negative for MI, pulmonary disease, liver disease, cancer.    SURGICAL HISTORY     C-section.  Central line for dialysis; subsequent removal.  Left upper extremity AV fistula.  Bilateral cataract surgeries, 2008.   Right ankle ORIF    Family history: Her mother has hypertension and her grandmother has diabetes.    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  She is a lifelong nonsmoker, does not drink alcohol or use illicit drugs.  She has a six-year-old son.    Outpatient Medications Prior to Visit   Medication Sig Dispense Refill   . B Complex-C-Folic Acid (NEPHROCAPS) 1 MG Oral Cap      . Calcium Acetate 667 MG Oral Cap Take 2,668 mg by mouth.     . Insulin Lispro, Human, (HUMALOG) 100 UNIT/ML Subcutaneous Solution inject 6-10 units subcutaneously before meals if needed     . PrednisoLONE Acetate 1 % Ophthalmic Suspension Place 1 drop into left EYE every 6 hours. 1 bottle 2   . Timolol Maleate 0.5 % Ophthalmic Solution Place 1 drop into left EYE 2 times a day. 1 bottle 1     No facility-administered medications prior to visit.      Review of systems: Her energy level is low.  She ambulates with a walker.  She does PT 3 times a week.  She walks 7-8 stairs into her house.  Her appetite is good and her weight has been stable.  Her vision is impaired; she can see light, shadows and color.  No chest pain or shortness of breath.  No nausea, vomiting, constipation, diarrhea.  No dysuria or hematuria.  No leg pain with activity.    Physical exam: Gen. appearance: Chronically ill-appearing woman in no acute distress.  She is alert and oriented and in good spirits. BP 132/75 Comment: post 5 mins  Pulse 69   Temp 98.4 F (36.9 C) (Temporal)   Ht 5' 6.3" (1.684 m)   Wt 150 lb 9.2 oz (68.3 kg)   SpO2 99% Comment: ra  BMI 24.08 kg/m    HEENT: Normocephalic, atraumatic, sclerae anicteric.  Neck is supple with possible left carotid  bruit or transmitted bruit from fistula; no right carotid bruit.  Chest: Lungs are clear  Cardiac: S1, S2, regular, 2/6 murmur  Abdomen: Soft, nontender, positive bowel sounds.  No palpable organomegaly.  Femoral pulses: 2+ bilaterally  Lower extremities: Left lower extremity without edema.  Dorsalis pedis pulse is 2+.    Transplant evaluation:  Myocardial Perfusion Scan: 07/09/15 Normal myocardial perfusion study.  Post stress: The ejection fraction was greater than 65%.  At rest: Global systolic function is normal.  No evidence for significant ischemia or scar is noted.    Echocardiogram: 07/09/15 1.  There is concentric left ventricular hypertrophy with normal chamber size and wall motion.  The ejection fraction rmal, estimated to be 60% or more.  2.  Right ventricular size and free wall motion are normal.  3.  Left atrium is enlarged.  4.  Right atrial size is normal.  5.  The aortic root and arch appeared normal within the field of view.  6.  Aortic valve has 3 cusps. There is thickening and focal echodensity consistent with calcification and/or fibrosis.  Systolic opening is reduced but there is no evidence of significant stenosis or insufficiency.  7.  The mitral valve leaflets are thin and normally mobile with no evidence of true prolapse.  Doppler evaluation demonstrates normal systolic and diastolic flow patterns.  8.  The tricuspid valve appears normal. The pulmonary artery pressure is high normal (36 mmHg) by Doppler calculation.  9.  The pulmonic valve appears normal.  The main pulmonary artery and proximal branches are of normal diameter.  10.  The inferior vena cava size is normal with normal respiratory viation.  11.  The pericardium is normal with no evidence of pericardial effusion or thickening.    CT abdomen and pelvis without/with contrast 10/15/15: 1. Diffuse bilateral ground-glass infiltrates, nonspecific, new since   09/07/2015.Pulmonary edema or infection/ inflammation are in the    differential.  2. Cardiomegaly again seen with trace pericardial effusion.Borderline   right cardiophrenic angle lymph node.Trace pericardial effusion.  3. Atrophic kidneys.Extensive vascular calcifications.Lack of   excretion on the delayed images consistent with known renal failure.  Extensive vascular calcifications likely due to renal disease.  A too small to  characterize lesion is in the liver.Atrophic end-stage native kidneys   are seen with a right renal too small to characterize lesion.     Carotid Doppler: 03/23/13:  0-15% bilaterally  Lower Extremity Doppler: 03/23/13:  No significant stenosis    MRI brain without contrast 12/23/15: IMPRESSION:  On a background of cerebral and cerebellar volume loss, there are abnormal T2/FLAIR signal changes in a cortical/juxtacortical locale involving the bifrontal, biparietal, and bilateral occipital lobes. No associated mass effect. Nonspecific findings may represent subacute /  chronic infarct versus PRES (as clinically indicated), among other etiologies. If available, a prior study would be helpful for comparison.    Coagulation Workup 09/17/15 Antiphospholipid Ab negative; Lupus anticoagulant negative; Factor V activity wnl; Antithrombin III activity 122 (H)     Assessment and plan: Audrey Bullock is a 30 year old woman with ESRD secondary to type 1 diabetes, here for evaluation for kidney-pancreas transplant.  She was seen and discussed with Dr. Benjamine Sprague.  Dr. Benjamine Sprague reviewed her CT scan and discussed considerations related to K-P transplant with her.  We discussed complications of kidney transplantation including bleeding, renal vein stenosis/thrombosis, renal artery stenosis/thrombosis, ureter leaking/stenosis/stricture, central venous access, re-operation/interventional radiology, biopsies and possible complications, kidney non-function/delayed, recurrence of original kidney disease, hematoma/seroma/lymphocele.   We discussed complications of  immunosuppressive medication including side effects, rejection/treatment, infection, cancer, non-compliance.   We also discussed complication of major surgery including poor wound healing/dehiscence and other problems, cardiac problems, anesthetic, stroke and other neurological problems, death.   We discussed donor allocation issues including [facility] policy of organ allocation, types of donors, donor acquired infections, donor acquired malignancy.     Her case will be discussed at our multidisciplinary transplant selection committee meeting.    Donor acceptance: She is willing to accept: PHS increased risk donor, hepatitis B core antibody positive donor, DCD.    I spent 40 minutes with the patient, and the majority of the time was spent in discussing issues related to kidney transplant. We discussed potential complications of major surgery, including anesthesia complications, among other issues. We discussed potential complications of kidney transplant surgery, including potential for blood vessel stenosis or thrombosis, potential for re-operation, potential for delayed function or non-function of the kidney graft, among other issues. We discussed potential complications of the immunosuppressive medication regimen, the need for strict adherence to the regimen, increased risk of infection and cancer, side effects. We discussed donor allocation issues and donor acceptance criteria.

## 2016-06-24 LAB — CMV IMMUNE STATUS BY EIA: CMV Immune Status Result: POSITIVE

## 2016-06-24 LAB — QUANTIFERON TB TEST
Quantiferon TB Ag Result: 0.059 IU gamma IF/mL
Quantiferon TB MIT Result: 8.882 IU gamma IF/mL
Quantiferon TB Nil Result: 0.05 IU gamma IF/mL

## 2016-07-01 LAB — HLA AB DETECTION (SENDOUT)

## 2016-07-02 LAB — HERPES TYPE 1 & 2 SEROLOGY
HSV1 WB Result: NEGATIVE
HSV2 WB Result: POSITIVE

## 2016-08-03 ENCOUNTER — Encounter (HOSPITAL_BASED_OUTPATIENT_CLINIC_OR_DEPARTMENT_OTHER): Payer: Self-pay

## 2016-08-04 ENCOUNTER — Encounter (HOSPITAL_BASED_OUTPATIENT_CLINIC_OR_DEPARTMENT_OTHER): Payer: Self-pay

## 2016-08-10 ENCOUNTER — Ambulatory Visit (HOSPITAL_BASED_OUTPATIENT_CLINIC_OR_DEPARTMENT_OTHER): Payer: Medicare Other | Attending: Ophthalmology | Admitting: Ophthalmology

## 2016-08-10 ENCOUNTER — Encounter (HOSPITAL_BASED_OUTPATIENT_CLINIC_OR_DEPARTMENT_OTHER): Payer: Self-pay

## 2016-08-10 DIAGNOSIS — H26493 Other secondary cataract, bilateral: Secondary | ICD-10-CM

## 2016-08-10 MED ORDER — PREDNISOLONE ACETATE 1 % OP SUSP
1.0000 [drp] | Freq: Four times a day (QID) | OPHTHALMIC | 2 refills | Status: AC
Start: 2016-08-10 — End: ?

## 2016-08-10 MED ORDER — BRIMONIDINE TARTRATE 0.2 % OP SOLN
1.0000 [drp] | Freq: Two times a day (BID) | OPHTHALMIC | 0 refills | Status: AC
Start: 2016-08-10 — End: ?

## 2016-08-10 NOTE — Patient Instructions (Signed)
Thank you for coming in today.  During today's visit we reviewed only your ophthalmology (eye-related) medications.  Please follow up with your primary care provider for any questions regarding other medications.    If your eyes were dilated during today's visit, the average dilation will last 4 to 6 hours and may impact your overall vision during this period. Please use caution during this period.    If you need to schedule or change a follow up appointment, please call [phone].  Our phone lines are open 7:00am to 8:00pm Monday through Saturdays and 9:00am to 5:30pm on Sundays.

## 2016-08-10 NOTE — Progress Notes (Signed)
I did not see the patient with the resident but reviewed the findings and note.  I agree with the resident assessment and plan.    Date of Service: 08/10/2016    Audrey Bullock. Josiah Lobo, MD  Assistant Professor of Ophthalmology  Division of Oculoplastic and Orbital Surgery  Associate Residency Program Director  [place] [facility]

## 2016-08-10 NOTE — Progress Notes (Signed)
Chief complaint: New Patient    HPI:  Audrey Bullock is a 31 year old female IDDM1 with ESRD on HD since 2012, stroke and bilateral vision loss in June 2017 (possible PRES) who received repeat YAG capsulotomy LE at last visit 06/01/16 for incomplete YAG caps left eye 04/30/16, returns for f/u. She states that things did get a bit clearer after the laser in the left eye. No other changes. Ran out of eye drops.     ROS: Complete review of systems performed, all negative including no headaches    Past Ocular History:  Pseudophakia OU 2007  Denies being told diabetic retinopathy  Denies ocular lasers or surgeries    Ocular Medications:   none    PMH:    Past Medical History:   Diagnosis Date   . End stage renal disease ([facility])    . Type 1 diabetes ([facility])        Medications:    Outpatient Medications Prior to Visit   Medication Sig Dispense Refill   . Albuterol Sulfate HFA 108 (90 Base) MCG/ACT Inhalation Aero Soln Inhale 2 puffs by mouth.     Marland Kitchen AmLODIPine Besylate 10 MG Oral Tab Take 10 mg by mouth.     . CloNIDine HCl 0.1 MG Oral Tab Take 0.3 mg by mouth.     . HydrALAZINE HCl 100 MG Oral Tab Take 100 mg by mouth.     . Hydrocortisone 1 % External Cream      . Insulin Glargine (LANTUS SOLOSTAR) 100 UNIT/ML Subcutaneous Solution Pen-injector INJECT 15 UNITS INTO THE SKIN DAILY IN CASE OF PUMP FAILURE     . Insulin Lispro (HUMALOG) 100 UNIT/ML Subcutaneous Solution INJECT 6 TO 10 UNITS SUBCUTANEOUSLY BEFORE MEALS IF NEEDED     . LevETIRAcetam 500 MG Oral Tab Take 500 mg by mouth.     . Metoclopramide HCl 5 MG Oral Tab take 1 tablet by mouth three times a day     . OxyCODONE HCl 5 MG Oral Tab Take 5 mg by mouth.     . PrednisoLONE Acetate 1 % Ophthalmic Suspension Place 1 drop into left EYE every 6 hours. 1 bottle 2   . Promethazine-Codeine 6.25-10 MG/5ML Oral Syrup Take 5 mL by mouth.     . Sevelamer Carbonate 800 MG Oral Tab Take 1,600 mg by mouth.     . Simvastatin 20 MG Oral Tab Take 20 mg by mouth.     . Timolol  Maleate 0.5 % Ophthalmic Solution Place 1 drop into left EYE 2 times a day. 1 bottle 1     No facility-administered medications prior to visit.        ALL:    Review of patient's allergies indicates:  No Known Allergies    FH:  Family History     Problem (# of Occurrences) Relation (Name,Age of Onset)    Diabetes (1) Maternal Grandmother    No Known Problems (2) Mother, Father    Renal Disease (1) Maternal Grandmother           SH:   Social History     Social History   . Marital status: Divorced     Spouse name: N/A   . Number of children: N/A   . Years of education: N/A     Occupational History   . Not on file.     Social History Main Topics   . Smoking status: Never Smoker   . Smokeless tobacco: Never Used   .  Alcohol use No   . Drug use: No   . Sexual activity: Not on file     Other Topics Concern   . Not on file     Social History Narrative   . No narrative on file       PHYSICAL EXAM   Eyes: See Eye Exam   Constitutional: Well-appearing  Psychiatric: Affect and behavior normal   Neurologic: Face is symmetric   Skin: No facial rashes  Head: Atraumatic   ENT: External ears and nose are normal in appearance .   Respiratory: normal respiratory effort     A/P:    1. Pseudophakia with posterior capsular opacity OD  - S/p YAG capsulotomy left eye 04/2016 and 05/2016. Continues to have very dense posterior capsular opacity left eye with some areas of breakthrough of the posterior capsular opacity.  Discussed r/b/a/i of additional YAG capsulotomy left eye. Discussed that may likely take multiple more phases of YAG capsulotomy to get through given significant density. Wishes to proceed with additional session today; informed consent obtained. See procedure note below.  - visual potential may be limited by stroke. dilated fundus exam hazy in areas but grossly wnl.   - PF four times per day x 1 week. History of elevated IOP while on steroids, so also add brimonidine twice a day left eye while using steroid (avoid timolol  due to history of asthma).     2. H/o R sided CVA  MRI (12/23/15) IMPRESSION:  On a background of cerebral and cerebellar volume loss, there are abnormal   T2/FLAIR signal changes in a cortical/juxtacortical locale involving the   bifrontal, biparietal, and bilateral occipital lobes. No associated mass   effect. Nonspecific findings may represent subacute / chronic infarct versus   PRES (as clinically indicated), among other etiologies. If available, a prior   study would be helpful for comparison.      3. Diabetes   - check for DR after yag cap, no definite retinopathy noted but hazy view 08/10/16.     4.  Pigmented eyelid lesion OD  - likely nevus  - longstanding and unchanged per patient  - monitor    RTC 3-4  weeks 4W for dilated fundus exam BOTH EYES, likely additional YAG capsulotomy left eye. Ultimately will also need right eye as well.        YAG Procedure Note   (Yttrium Aluminum Garnet laser)  Procedure: Capsulotomy    Ocular diagnosis: Capsular opacification    Final verification:    Correct patient NAME and ID YES   Correct EYE marked YES   Correct PROCEDURE YES   Correct EQUIPMENT and SETTINGS YES   Completed CONSENT YES  Date: 08/10/16     Prior exam notes were available and reviewed: Yes    Procedure:  Left eye  Laser: Nd:Yag  # 54 / 1.5-1.8  mJ single    anesthesia: topical tetracaine 0.5% one drop   lens: Abraham capsulotomy lens     Complications: none  Procedure performed by Resident physician without Attending present  Assessment: posterior capsular opacity left eye, very dense and unable to fully penetrate with safe level of laser today.   Plan: PF four times per day OS and brimonidine twice a day OS, Rx sent to pharmacy today        Ervin Knack, PGY-4    Seen under Dr. Josiah Lobo

## 2016-08-19 ENCOUNTER — Encounter (HOSPITAL_BASED_OUTPATIENT_CLINIC_OR_DEPARTMENT_OTHER): Payer: Self-pay

## 2016-09-07 ENCOUNTER — Encounter (HOSPITAL_BASED_OUTPATIENT_CLINIC_OR_DEPARTMENT_OTHER): Payer: Medicare Other

## 2016-09-16 ENCOUNTER — Telehealth (HOSPITAL_BASED_OUTPATIENT_CLINIC_OR_DEPARTMENT_OTHER): Payer: Self-pay | Admitting: Optometrist

## 2016-09-16 NOTE — Telephone Encounter (Signed)
(  TEXTING IS AN OPTION FOR [facility] ONLY)  Is this a [facility]? No      RETURN CALL: Detailed message on voicemail only      SUBJECT:  Appointment Request     REASON FOR REQUEST/SYMPTOMS: Removal of cataract    REFERRING PROVIDER: unknown   REQUEST APPOINTMENT WITH: any   REQUESTED DATE:Soonest avail  TIME: any   UNABLE TO APPOINT BECAUSE: Patient needs sooner appointment missed appointment on  09/07/16

## 2016-09-21 ENCOUNTER — Ambulatory Visit (HOSPITAL_BASED_OUTPATIENT_CLINIC_OR_DEPARTMENT_OTHER): Payer: Medicare Other

## 2016-10-05 ENCOUNTER — Ambulatory Visit (HOSPITAL_BASED_OUTPATIENT_CLINIC_OR_DEPARTMENT_OTHER): Payer: Medicare Other | Attending: Ophthalmology | Admitting: Internal Medicine

## 2016-10-05 ENCOUNTER — Encounter (HOSPITAL_BASED_OUTPATIENT_CLINIC_OR_DEPARTMENT_OTHER): Payer: Self-pay

## 2016-10-05 DIAGNOSIS — H26493 Other secondary cataract, bilateral: Secondary | ICD-10-CM

## 2016-10-05 NOTE — Progress Notes (Signed)
Chief complaint: New Patient    HPI:  Audrey Bullock is a 31 year old female IDDM1 with ESRD on HD since 2012, stroke and bilateral vision loss in June 2017 (possible PRES) who received repeat YAG capsulotomy LE at last visit 06/01/16 for incomplete YAG caps left eye 04/30/16, repeated 08/2016, returns for f/u. She states that things did get a bit clearer after the laser in the left eye. No other changes. Used eye drops appropriately. States no eye pain or pressure.     States BG 100-150.   Lab Results   Component Value Date    A1C 7.6 12/19/2015        ROS: Complete review of systems performed, all negative including no headaches    Past Ocular History:  Pseudophakia OU 2007  Denies being told diabetic retinopathy  Denies ocular lasers or surgeries    Ocular Medications:   none    PMH:     End stage renal disease  DMI  HTN  PRES (posterior reversible encephalopathy syndrome)    Medications:    Outpatient Medications Prior to Visit   Medication Sig Dispense Refill   . Albuterol Sulfate HFA 108 (90 Base) MCG/ACT Inhalation Aero Soln Inhale 2 puffs by mouth.     Marland Kitchen AmLODIPine Besylate 10 MG Oral Tab Take 10 mg by mouth.     . CloNIDine HCl 0.1 MG Oral Tab Take 0.3 mg by mouth.     . HydrALAZINE HCl 100 MG Oral Tab Take 100 mg by mouth.     . Hydrocortisone 1 % External Cream      . Insulin Glargine (LANTUS SOLOSTAR) 100 UNIT/ML Subcutaneous Solution Pen-injector INJECT 15 UNITS INTO THE SKIN DAILY IN CASE OF PUMP FAILURE     . Insulin Lispro (HUMALOG) 100 UNIT/ML Subcutaneous Solution INJECT 6 TO 10 UNITS SUBCUTANEOUSLY BEFORE MEALS IF NEEDED     . LevETIRAcetam 500 MG Oral Tab Take 500 mg by mouth.     . Metoclopramide HCl 5 MG Oral Tab take 1 tablet by mouth three times a day     . OxyCODONE HCl 5 MG Oral Tab Take 5 mg by mouth.     . PrednisoLONE Acetate 1 % Ophthalmic Suspension Place 1 drop into left EYE every 6 hours. 1 bottle 2   . Promethazine-Codeine 6.25-10 MG/5ML Oral Syrup Take 5 mL by mouth.     .  Sevelamer Carbonate 800 MG Oral Tab Take 1,600 mg by mouth.     . Simvastatin 20 MG Oral Tab Take 20 mg by mouth.     . Timolol Maleate 0.5 % Ophthalmic Solution Place 1 drop into left EYE 2 times a day. 1 bottle 1     No facility-administered medications prior to visit.        ALL:    Review of patient's allergies indicates:  No Known Allergies    FH:  Family History     Problem (# of Occurrences) Relation (Name,Age of Onset)    Diabetes (1) Maternal Grandmother    No Known Problems (2) Mother, Father    Renal Disease (1) Maternal Grandmother           SH:   Social History     Social History   . Marital status: Divorced     Spouse name: N/A   . Number of children: N/A   . Years of education: N/A     Occupational History   . Not on file.  Social History Main Topics   . Smoking status: Never Smoker   . Smokeless tobacco: Never Used   . Alcohol use No   . Drug use: No   . Sexual activity: Not on file     Other Topics Concern   . Not on file     Social History Narrative   . No narrative on file       PHYSICAL EXAM   Eyes: See Eye Exam   Constitutional: Well-appearing  Psychiatric: Affect and behavior normal   Neurologic: Face is symmetric   Skin: No facial rashes  Head: Atraumatic   ENT: External ears and nose are normal in appearance .   Respiratory: normal respiratory effort     A/P:    1. 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 Remnant of posterior capsular opacity remains left eye. Dense posterior capsular opacity RE.   -Discussed r/b/a/i of additional YAG capsulotomy OU. Discussed that may likely take multiple more phases of YAG capsulotomy to get through given significant density. Wishes to proceed with additional session today; informed consent obtained. See procedure note below.  - visual potential may be limited by stroke.    - PF four times per day x 1 week. History of elevated IOP while on steroids, so also add brimonidine  twice a day left eye while using steroid (avoid timolol due to history of asthma).     2. H/o R sided CVA  MRI (12/23/15) IMPRESSION:  On a background of cerebral and cerebellar volume loss, there are abnormal   T2/FLAIR signal changes in a cortical/juxtacortical locale involving the   bifrontal, biparietal, and bilateral occipital lobes. No associated mass   effect. Nonspecific findings may represent subacute / chronic infarct versus   PRES (as clinically indicated), among other etiologies. If available, a prior   study would be helpful for comparison.  -Visually significant from bilateral occipital lobe involvement    3. Diabetes   - check for DR after yag cap, no definite retinopathy noted left eye 10/05/2016     4.  Pigmented eyelid lesion OD  - likely nevus  - longstanding and unchanged per patient  - monitor    RTC 2 weeks for dilated fundus exam and likely repeat yag caps RE    Volney Presser, MD  Ophthalmology Resident, PGY4    Pt seen under Dr. Remus Loffler Procedure Note   (Yttrium Aluminum Garnet laser)  Procedure: Capsulotomy    Ocular diagnosis: Capsular opacification    Final verification:    Correct patient NAME and ID YES   Correct EYE marked YES   Correct PROCEDURE YES   Correct EQUIPMENT and SETTINGS YES   Completed CONSENT YES  Date: 10/05/2016      Prior exam notes were available and reviewed: Yes    Procedure:   Both eyes  Laser: Nd:Yag    RE # 102 / 130 mJ single   LE # 112 / 112 mJ single    anesthesia: topical tetracaine 0.5% one drop   lens: Abraham capsulotomy lens     Complications: none  Procedure performed by Resident physician with Attending present  Assessment: as above  Plan: as above

## 2016-10-06 NOTE — Progress Notes (Signed)
I did not see the patient with the resident but reviewed the findings and note.  I agree with the resident assessment and plan.    Date of Service: 10/05/2016    Audrey Bullock. Josiah Lobo, MD  Assistant Professor of Ophthalmology  Division of Oculoplastic and Orbital Surgery  Associate Residency Program Director  [place] [facility]

## 2016-10-12 ENCOUNTER — Ambulatory Visit (HOSPITAL_BASED_OUTPATIENT_CLINIC_OR_DEPARTMENT_OTHER): Payer: Medicare Other

## 2016-10-12 ENCOUNTER — Ambulatory Visit: Payer: Medicare Other

## 2016-10-12 ENCOUNTER — Encounter (HOSPITAL_BASED_OUTPATIENT_CLINIC_OR_DEPARTMENT_OTHER): Payer: Self-pay

## 2016-10-12 NOTE — Progress Notes (Signed)
Called by MA Tip to assist with patient  Audrey Bullock.  Patient was seated in a wheelchair in a conference room.  She was slumped to side but was able to open her eyes and stated her blood sugar was low.  BGL by fingerstick was 27.  Patient had had one 4 0z juice and a candy bar. After 8 more ounces of juice.  Blood sugar was up to 57 and patient was able to state she took her usual insulin this am but did not eat enough. Ennis reported her blood sugars have been stable but she has had a couple of low days and it looks like maybe she needs less insulin.  She will followup with her PCP this afternoon to review possible changes to insulins. Patient was able to eat half a sandwich and  reported she is feeling much better.   After 20 minutes her blood sugar 72. Now meeting with the social worker

## 2016-10-13 LAB — GLUCOSE POC, ~~LOC~~
Glucose (POC): 27 mg/dL — CL (ref 62–125)
Glucose (POC): 53 mg/dL — ABNORMAL LOW (ref 62–125)
Glucose (POC): 72 mg/dL (ref 62–125)
Glucose (POC): 173 mg/dL — ABNORMAL HIGH (ref 62–125)
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
## 2016-10-19 ENCOUNTER — Ambulatory Visit (HOSPITAL_BASED_OUTPATIENT_CLINIC_OR_DEPARTMENT_OTHER): Payer: Medicare Other | Attending: Ophthalmology | Admitting: Ophthalmology

## 2016-10-19 DIAGNOSIS — H26493 Other secondary cataract, bilateral: Secondary | ICD-10-CM | POA: Insufficient documentation

## 2016-10-19 NOTE — Progress Notes (Signed)
CC: YAG capsulotomy follow-up    HPI:  Audrey Bullock is a 31 year old female IDDM1 with ESRD on HD since 2012, stroke and bilateral vision loss in June 2017 (possible PRES) s/p YAG capsulotomy LE on 04/2016, 05/2016 and 08/2016 YAG caps right eye 10/2016 that returns for f/u. She states vision is a bit clearer after the laser in the left eye. No other changes. Used eye drops appropriately. States no eye pain or pressure.     ROS: Complete review of systems performed, all negative including no headaches    Past Ocular History:  Pseudophakia OU 2007  Denies being told diabetic retinopathy  Denies ocular lasers or surgeries    Ocular Medications:   none    PMHx:     End stage renal disease  DMI diagnosed at age 89  HTN  PRES (posterior reversible encephalopathy syndrome)    Medications:    Insulin  Clonidine  Hydralazine    ALL:    Review of patient's allergies indicates:  No Known Allergies    FHx:   mother has glaucoma     SHx:   Tobacco: denies  Alcohol: denies  Drugs: denies  Lives in [place] in her mom, her son and her stepdad    PHYSICAL EXAM   Eyes: See Eye Exam   Constitutional: Well-appearing  Psychiatric: Affect and behavior normal   Neurologic: Face is symmetric   Skin: No facial rashes  Head: Atraumatic   ENT: External ears and nose are normal in appearance .   Respiratory: normal respiratory effort     Assessment/Plan:  1. 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 Remnant of posterior capsular opacity remains left eye - Dense posterior capsular opacity RE s/p YAG 10/2016, visual axis mostly open and there is no objective change in vision (CF 2' last visit, CF face today) and good view of posterior pole   - Given decreased vision most likely 2/2 #2 would not pursue more yag laser at this time  - RTC 6 months, sooner prn     2. H/o R sided CVA  - MRI (12/23/15) with abnormal T2/FLAIR signal changes in a cortical/juxtacortical locale  involving the bifrontal, biparietal, and bilateral occipital lobes.   - Visually significant from bilateral occipital lobe involvement    3. Diabetes   - no definite retinopathy noted OS 10/05/2016     4.  Pigmented eyelid lesion OD  - likely nevus  - longstanding and unchanged per patient  - monitor    Patient seen under supervision of Dr Valinda Party D. Orpah Melter, MD, PhD  Ophthalmology R2

## 2016-10-19 NOTE — Progress Notes (Signed)
Audrey Bullock, Audrey Bullock [id number]  DOB: 1986-06-27, Gender: F  [facility] Transplant Program - Outpatient Record  Nurse Education Note: Pre-Kidney Transplant Work-Up Class  Date/Time: 10/12/2016 12:45  [facility] Note authenticated by Hulan Saas on 10/19/2016 9:05:22 AM     Today, I spent 45 minutes discussing Kidney and Pancreas transplantation with Jodi Mourning and other(s), [name] (Aunt).    I discussed the transplant work-up process within the guidelines set forth by the [facility] and Pancreas Transplant Program.    I discussed telephone contacts including Transplant Coordinator office telephone numbers, fax numbers.     The Transplant Evaluation process was discussed in detail. The discussion included the purpose of the evaluation. What medical tests could be expected at the [facility] including blood draw for the following serologic testing: HIV, Hepatitis A, B, C, Syphillis, and TB. What testing would need to be completed through their primary care physician's office.    I discussed what patients could anticipate as we evaluate their transplant candidacy. I also discussed that additional testing may be required at [facility] which would entail additional visits to [facility].    I discussed the [facility]'s policy of no tolerance use of illicit drugs and alcohol. Random screening for nicotine and cotinine was discussed; the patient consented to random screening today at this initial visit.     I discussed what happens when all testing is completed including the selection committee review process for all transplant candidates. A copy of the [facility] Selection Criteria for Kidney and Pancreas Transplantation was reviewed.    I discussed the 'The Patient Acknowledgement for Kidney and Pancreas Transplant' with the patient. The discussion included purpose, process, testing, risks and benefits of Kidney and Pancreas transplant.     I discussed the UNOS Multiple Listing and Waiting Time Transfer with  the patient.   The patient watched the Kidney and Pancreas Transplant Education Class DVD.   The patient read the Guide to Your Kidney Transplant manual.    Good knowledge base required around peri and post transplant needs. Reviewed transplant medications and rejection. Discussed post transplant follow-up including frequent and early visit. Verbalized understanding, able to teach back.     This information was provided by:     Hulan Saas, RN.  [facility]

## 2016-10-20 ENCOUNTER — Encounter (HOSPITAL_BASED_OUTPATIENT_CLINIC_OR_DEPARTMENT_OTHER): Payer: Self-pay

## 2016-10-20 NOTE — Progress Notes (Signed)
I did not see the patient with the resident but reviewed the findings and note.  I agree with the resident assessment and plan.    Date of Service: 10/19/2016    Venida Jarvis. Josiah Lobo, MD  Assistant Professor of Ophthalmology  Division of Oculoplastic and Orbital Surgery  Associate Residency Program Director  [facility]

## 2016-10-25 ENCOUNTER — Inpatient Hospital Stay: Payer: Self-pay

## 2016-10-26 ENCOUNTER — Encounter (HOSPITAL_COMMUNITY): Payer: Self-pay

## 2016-10-26 ENCOUNTER — Encounter (HOSPITAL_BASED_OUTPATIENT_CLINIC_OR_DEPARTMENT_OTHER): Payer: Self-pay

## 2016-12-06 ENCOUNTER — Inpatient Hospital Stay: Payer: Self-pay

## 2016-12-29 ENCOUNTER — Encounter (HOSPITAL_BASED_OUTPATIENT_CLINIC_OR_DEPARTMENT_OTHER): Payer: Self-pay

## 2017-02-04 ENCOUNTER — Encounter (HOSPITAL_BASED_OUTPATIENT_CLINIC_OR_DEPARTMENT_OTHER): Payer: Self-pay

## 2017-02-09 ENCOUNTER — Encounter (HOSPITAL_BASED_OUTPATIENT_CLINIC_OR_DEPARTMENT_OTHER): Payer: Self-pay

## 2017-02-17 DIAGNOSIS — Z992 Dependence on renal dialysis: Secondary | ICD-10-CM

## 2017-02-17 DIAGNOSIS — N186 End stage renal disease: Secondary | ICD-10-CM

## 2017-02-22 DIAGNOSIS — Z09 Encounter for follow-up examination after completed treatment for conditions other than malignant neoplasm: Secondary | ICD-10-CM

## 2017-02-22 DIAGNOSIS — Z992 Dependence on renal dialysis: Secondary | ICD-10-CM

## 2017-02-22 DIAGNOSIS — N186 End stage renal disease: Secondary | ICD-10-CM

## 2017-02-24 ENCOUNTER — Inpatient Hospital Stay: Payer: Self-pay

## 2017-03-01 ENCOUNTER — Encounter (HOSPITAL_BASED_OUTPATIENT_CLINIC_OR_DEPARTMENT_OTHER): Payer: Self-pay

## 2017-03-10 ENCOUNTER — Encounter (HOSPITAL_BASED_OUTPATIENT_CLINIC_OR_DEPARTMENT_OTHER): Payer: Self-pay

## 2017-03-25 ENCOUNTER — Encounter (HOSPITAL_COMMUNITY): Payer: Self-pay | Admitting: *Deleted

## 2017-03-25 ENCOUNTER — Emergency Department (HOSPITAL_COMMUNITY)
Admission: EM | Admit: 2017-03-25 | Discharge: 2017-03-25 | Disposition: A | Discharge status: Home / Self Care | Payer: BLUE CROSS/BLUE SHIELD | Attending: Emergency Medicine | Admitting: Emergency Medicine

## 2017-03-25 DIAGNOSIS — H66005 Acute suppurative otitis media without spontaneous rupture of ear drum, recurrent, left ear: Secondary | ICD-10-CM | POA: Insufficient documentation

## 2017-03-25 DIAGNOSIS — Z87891 Personal history of nicotine dependence: Secondary | ICD-10-CM | POA: Insufficient documentation

## 2017-03-25 DIAGNOSIS — Z79899 Other long term (current) drug therapy: Secondary | ICD-10-CM | POA: Insufficient documentation

## 2017-03-25 LAB — BASIC METABOLIC PANEL
Anion gap: 5 (ref 5–15)
BUN: 7 mg/dL (ref 6–20)
CO2: 24 mmol/L (ref 22–32)
CREATININE: 0.59 mg/dL (ref 0.44–1.00)
Calcium: 9.4 mg/dL (ref 8.9–10.3)
Chloride: 106 mmol/L (ref 101–111)
Glucose, Bld: 95 mg/dL (ref 65–99)
Potassium: 4 mmol/L (ref 3.5–5.1)
SODIUM: 135 mmol/L (ref 135–145)

## 2017-03-25 LAB — URINALYSIS, ROUTINE W REFLEX MICROSCOPIC
BILIRUBIN URINE: NEGATIVE
Glucose, UA: NEGATIVE mg/dL
Hgb urine dipstick: NEGATIVE
Ketones, ur: NEGATIVE mg/dL
LEUKOCYTES UA: NEGATIVE
NITRITE: NEGATIVE
Protein, ur: NEGATIVE mg/dL
SPECIFIC GRAVITY, URINE: 1.015 (ref 1.005–1.030)
pH: 6 (ref 5.0–8.0)

## 2017-03-25 LAB — CBC
HEMATOCRIT: 40.9 % (ref 36.0–46.0)
HEMOGLOBIN: 13.1 g/dL (ref 12.0–15.0)
MCH: 28.5 pg (ref 26.0–34.0)
MCHC: 32 g/dL (ref 30.0–36.0)
MCV: 88.9 fL (ref 78.0–100.0)
Platelets: 264 10*3/uL (ref 150–400)
RBC: 4.6 MIL/uL (ref 3.87–5.11)
RDW: 13.4 % (ref 11.5–15.5)
WBC: 8.6 10*3/uL (ref 4.0–10.5)

## 2017-03-25 LAB — I-STAT BETA HCG BLOOD, ED (MC, WL, AP ONLY)

## 2017-03-25 LAB — CBG MONITORING, ED: GLUCOSE-CAPILLARY: 93 mg/dL (ref 65–99)

## 2017-03-25 MED ORDER — AMOXICILLIN-POT CLAVULANATE 875-125 MG PO TABS: 1.0000 | ORAL_TABLET | Freq: Two times a day (BID) | ORAL | 0 refills | Status: AC

## 2017-03-25 MED ORDER — DEXAMETHASONE 4 MG PO TABS
10.0000 mg | ORAL_TABLET | Freq: Once | ORAL | Status: AC
  Administered 2017-03-25: 10 mg via ORAL
  Filled 2017-03-25: qty 3

## 2017-03-25 MED ORDER — MECLIZINE HCL 25 MG PO TABS: 25.0000 mg | ORAL_TABLET | Freq: Three times a day (TID) | ORAL | 0 refills | Status: DC | PRN

## 2017-03-25 MED ORDER — FLUTICASONE PROPIONATE 50 MCG/ACT NA SUSP: 2.0000 | Freq: Every day | NASAL | 0 refills | Status: AC

## 2017-03-25 NOTE — ED Triage Notes (Signed)
Pt states left ear pain for a while and was seen at [facility] for it and now it is worse into her jaw and throat, feels swollen, pt states she feels like her eyes are shaky, not blurred.  Pt reports dizziness intermittently since ear problem.  Pt states she has been on recent antibiotics for an std,

## 2017-03-25 NOTE — ED Provider Notes (Signed)
[facility] DEPT Provider Note   CSN: [id number] Arrival date & time: 03/25/17  1131     History   Chief Complaint Chief Complaint  Patient presents with  . Dizziness  . Otalgia    HPI Carla Garrett is a 31 y.o. female.  HPI   31 year old female with no significant past medical history here with left ear pain and dizziness. The patient states that for the last 3 weeks, she has had progressively worsening dull, aching, pressure-like pain in her left ear. She has had associated subjective decreased hearing. She saw her primary for this at the [place], who prescribed Mucinex and advise supportive care. She has had persistent worsening of pain since then. Over the last 24 hours, she has developed aching pain along her left anterior neck along with sharp, stabbing pains. She also endorses a sensation of movement and dizziness. Denies any vision changes. No difficulty swallowing or speaking. She has no history of instrumentation of the ears. She has never seen an ENT. Denies any fevers or chills. No focal numbness or weakness.  Past Medical History:  Diagnosis Date  . Back pain   . Chlamydia   . Depression   . Eczema   . Headache   . Hemorrhoids   . Obesity (BMI 30-39.9)   . Post traumatic stress disorder (PTSD)     There are no active problems to display for this patient.   Past Surgical History:  Procedure Laterality Date  . COLONOSCOPY WITH PROPOFOL N/A 05/19/2015   Procedure: COLONOSCOPY WITH PROPOFOL;  Surgeon: Hulen Luster, MD;  Location: [facility] ENDOSCOPY;  Service: Gastroenterology;  Laterality: N/A;  . DILATION AND CURETTAGE, DIAGNOSTIC / THERAPEUTIC    . EYE SURGERY    . TONSILLECTOMY      OB History    No data available       Home Medications    Prior to Admission medications   Medication Sig Start Date End Date Taking? Authorizing Provider  amoxicillin-clavulanate (AUGMENTIN) 875-125 MG tablet Take 1 tablet by mouth every 12 (twelve) hours. 03/25/17 04/04/17   Duffy Bruce, MD  cetirizine (ZYRTEC) 10 MG tablet Take 10 mg by mouth daily.    [provider]  fluticasone (FLONASE) 50 MCG/ACT nasal spray Place 2 sprays into both nostrils daily. 03/25/17 04/08/17  Duffy Bruce, MD  levonorgestrel (MIRENA) 20 MCG/24HR IUD 1 each by Intrauterine route once.    [provider]  meclizine (ANTIVERT) 25 MG tablet Take 1 tablet (25 mg total) by mouth 3 (three) times daily as needed for dizziness. 03/25/17   Duffy Bruce, MD  saccharomyces boulardii (FLORASTOR) 250 MG capsule Take 250 mg by mouth 2 (two) times daily.    [provider]  sertraline (ZOLOFT) 100 MG tablet Take 200 mg by mouth daily.    [provider]    Family History No family history on file.  Social History Social History  Substance Use Topics  . Smoking status: Former Research scientist (life sciences)  . Smokeless tobacco: Never Used  . Alcohol use No     Allergies   Patient has no known allergies.   Review of Systems Review of Systems  Constitutional: Negative for chills and fever.  HENT: Positive for congestion and ear pain. Negative for rhinorrhea, sore throat, trouble swallowing and voice change.   Eyes: Negative for visual disturbance.  Respiratory: Negative for cough, shortness of breath and wheezing.   Cardiovascular: Negative for chest pain and leg swelling.  Gastrointestinal: Negative for abdominal pain, diarrhea,  nausea and vomiting.  Genitourinary: Negative for dysuria, flank pain, vaginal bleeding and vaginal discharge.  Musculoskeletal: Negative for neck pain.  Skin: Negative for rash.  Allergic/Immunologic: Negative for immunocompromised state.  Neurological: Positive for dizziness. Negative for syncope and headaches.  Hematological: Does not bruise/bleed easily.  All other systems reviewed and are negative.    Physical Exam Updated Vital Signs BP (!) 124/98   Pulse 70   Temp 98.2 F (36.8 C) (Oral)   Resp 18   Ht 5\' 2"  (1.575 m)   Wt  90.7 kg (200 lb)   SpO2 100%   BMI 36.58 kg/m   Physical Exam  Constitutional: She is oriented to person, place, and time. She appears well-developed and well-nourished. No distress.  HENT:  Head: Normocephalic and atraumatic.  Significant serous effusions bilaterally. The left tympanic membrane is also mildly erythematous with opacity layering along the inferior aspect. There is scarring of the tympanic membrane. External canal appears normal with no edema or tenderness. No tetanus with manipulation of the external ear. No mastoid erythema. There is mild, mobile, soft lymphadenopathy of the left anterior cervical chain.  Eyes: Conjunctivae are normal.  Neck: Neck supple.  Cardiovascular: Normal rate, regular rhythm and normal heart sounds.  Exam reveals no friction rub.   No murmur heard. Pulmonary/Chest: Effort normal and breath sounds normal. No respiratory distress. She has no wheezes. She has no rales.  Abdominal: She exhibits no distension.  Musculoskeletal: She exhibits no edema.  Neurological: She is alert and oriented to person, place, and time. She exhibits normal muscle tone.  Skin: Skin is warm. Capillary refill takes less than 2 seconds.  Psychiatric: She has a normal mood and affect.  Nursing note and vitals reviewed.   Neurological Exam:  Mental Status: Alert and oriented to person, place, and time. Attention and concentration normal. Speech clear. Recent memory is intact. Cranial Nerves: Visual fields grossly intact. EOMI and PERRLA. No nystagmus noted. Facial sensation intact at forehead, maxillary cheek, and chin/mandible bilaterally. No facial asymmetry or weakness. Hearing grossly normal. Uvula is midline, and palate elevates symmetrically. Normal SCM and trapezius strength. Tongue midline without fasciculations. Motor: Muscle strength 5/5 in proximal and distal UE and LE bilaterally. No pronator drift. Muscle tone normal. Reflexes: 2+ and symmetrical in all four  extremities.  Sensation: Intact to light touch in upper and lower extremities distally bilaterally.  Gait: Normal without ataxia. Coordination: Normal FTN bilaterally.     ED Treatments / Results  Labs (all labs ordered are listed, but only abnormal results are displayed) Labs Reviewed  BASIC METABOLIC PANEL  CBC  URINALYSIS, ROUTINE W REFLEX MICROSCOPIC  CBG MONITORING, ED  I-STAT BETA HCG BLOOD, ED ([facility], [facility], [facility] ONLY)    EKG  EKG Interpretation  Date/Time:  Friday March 25 2017 12:12:02 EDT Ventricular Rate:  64 PR Interval:  152 QRS Duration: 74 QT Interval:  418 QTC Calculation: 431 R Axis:   66 Text Interpretation:  Normal sinus rhythm Normal ECG No old tracing to compare Confirmed by Duffy Bruce [phone]) on 03/25/2017 4:42:48 PM       Radiology No results found.  Procedures Procedures (including critical care time)  Medications Ordered in ED Medications  dexamethasone (DECADRON) tablet 10 mg (not administered)     Initial Impression / Assessment and Plan / ED Course  I have reviewed the triage vital signs and the nursing notes.  Pertinent labs & imaging results that were available during my care of the patient were  reviewed by me and considered in my medical decision making (see chart for details).     31 year old female with no significant past medical history here with left ear pain and now mild dizziness. I suspect patient has acute otitis media, likely secondary to chronic eustachian tube dysfunction. Patient has a history of the same and has never seen an ENT for this. She does have nasal mucosal edema bilaterally likely contributing to this. Will start the patient on Flonase, give a single dose of Decadron comments with the patient on antibiotics. She shows no evidence of complication. No evidence of otitis externa. No mastoid tenderness, erythema, or asymmetry. She is fully neurovascularly intact with normal cerebellar function no evidence to  suggest carotid dissection, CVA, or other acute intracranial abnormality. Will discharge home.  This note was prepared with assistance of Systems analyst. Occasional wrong-word or sound-a-like substitutions may have occurred due to the inherent limitations of voice recognition software.   Final Clinical Impressions(s) / ED Diagnoses   Final diagnoses:  Recurrent acute suppurative otitis media without spontaneous rupture of left tympanic membrane    New Prescriptions New Prescriptions   AMOXICILLIN-CLAVULANATE (AUGMENTIN) 875-125 MG TABLET    Take 1 tablet by mouth every 12 (twelve) hours.   FLUTICASONE (FLONASE) 50 MCG/ACT NASAL SPRAY    Place 2 sprays into both nostrils daily.   MECLIZINE (ANTIVERT) 25 MG TABLET    Take 1 tablet (25 mg total) by mouth 3 (three) times daily as needed for dizziness.     Duffy Bruce, MD 03/25/17 [phone]

## 2017-04-26 ENCOUNTER — Encounter (HOSPITAL_BASED_OUTPATIENT_CLINIC_OR_DEPARTMENT_OTHER): Payer: Medicare Other

## 2018-03-05 ENCOUNTER — Emergency Department
Admission: EM | Admit: 2018-03-05 | Discharge: 2018-03-06 | Disposition: A | Discharge status: Home / Self Care | Payer: Self-pay | Attending: Emergency Medicine | Admitting: Emergency Medicine

## 2018-03-05 ENCOUNTER — Other Ambulatory Visit: Payer: Self-pay

## 2018-03-05 ENCOUNTER — Encounter: Payer: Self-pay | Admitting: Emergency Medicine

## 2018-03-05 ENCOUNTER — Emergency Department: Payer: Self-pay

## 2018-03-05 DIAGNOSIS — O26891 Other specified pregnancy related conditions, first trimester: Secondary | ICD-10-CM

## 2018-03-05 DIAGNOSIS — Z87891 Personal history of nicotine dependence: Secondary | ICD-10-CM | POA: Insufficient documentation

## 2018-03-05 DIAGNOSIS — O209 Hemorrhage in early pregnancy, unspecified: Secondary | ICD-10-CM

## 2018-03-05 DIAGNOSIS — O208 Other hemorrhage in early pregnancy: Secondary | ICD-10-CM

## 2018-03-05 DIAGNOSIS — Z3A01 Less than 8 weeks gestation of pregnancy: Secondary | ICD-10-CM | POA: Insufficient documentation

## 2018-03-05 DIAGNOSIS — Z79899 Other long term (current) drug therapy: Secondary | ICD-10-CM | POA: Insufficient documentation

## 2018-03-05 DIAGNOSIS — R102 Pelvic and perineal pain: Secondary | ICD-10-CM

## 2018-03-05 DIAGNOSIS — O200 Threatened abortion: Secondary | ICD-10-CM

## 2018-03-05 LAB — COMPREHENSIVE METABOLIC PANEL
ALK PHOS: 62 U/L (ref 38–126)
ALT: 19 U/L (ref 0–44)
AST: 19 U/L (ref 15–41)
Albumin: 4.4 g/dL (ref 3.5–5.0)
Anion gap: 5 (ref 5–15)
BUN: 8 mg/dL (ref 6–20)
CALCIUM: 9.3 mg/dL (ref 8.9–10.3)
CO2: 25 mmol/L (ref 22–32)
Chloride: 107 mmol/L (ref 98–111)
Creatinine, Ser: 0.57 mg/dL (ref 0.44–1.00)
Glucose, Bld: 99 mg/dL (ref 70–99)
Potassium: 3.8 mmol/L (ref 3.5–5.1)
Sodium: 137 mmol/L (ref 135–145)
Total Bilirubin: 0.5 mg/dL (ref 0.3–1.2)
Total Protein: 7.6 g/dL (ref 6.5–8.1)

## 2018-03-05 LAB — CBC
HCT: 41 % (ref 35.0–47.0)
Hemoglobin: 13.7 g/dL (ref 12.0–16.0)
MCH: 29.8 pg (ref 26.0–34.0)
MCHC: 33.5 g/dL (ref 32.0–36.0)
MCV: 88.9 fL (ref 80.0–100.0)
PLATELETS: 261 10*3/uL (ref 150–440)
RBC: 4.61 MIL/uL (ref 3.80–5.20)
RDW: 14.2 % (ref 11.5–14.5)
WBC: 12.4 10*3/uL — AB (ref 3.6–11.0)

## 2018-03-05 LAB — URINALYSIS, COMPLETE (UACMP) WITH MICROSCOPIC
Bacteria, UA: NONE SEEN
Bilirubin Urine: NEGATIVE
GLUCOSE, UA: NEGATIVE mg/dL
Hgb urine dipstick: NEGATIVE
KETONES UR: NEGATIVE mg/dL
Leukocytes, UA: NEGATIVE
Nitrite: NEGATIVE
Protein, ur: NEGATIVE mg/dL
SPECIFIC GRAVITY, URINE: 1.028 (ref 1.005–1.030)
pH: 6 (ref 5.0–8.0)

## 2018-03-05 LAB — ABO/RH: ABO/RH(D): O NEG

## 2018-03-05 LAB — ANTIBODY SCREEN: Antibody Screen: NEGATIVE

## 2018-03-05 LAB — HCG, QUANTITATIVE, PREGNANCY: hCG, Beta Chain, Quant, S: 20840 m[IU]/mL — ABNORMAL HIGH (ref <5)

## 2018-03-05 MED ORDER — RHO D IMMUNE GLOBULIN 1500 UNIT/2ML IJ SOSY
300.0000 ug | PREFILLED_SYRINGE | Freq: Once | INTRAMUSCULAR | Status: AC
  Administered 2018-03-05: 300 ug via INTRAVENOUS
  Filled 2018-03-05: qty 2

## 2018-03-05 NOTE — ED Notes (Signed)
First Nurse Note: Pt to ED c/o lower abdominal and back pain and vaginal spotting, recent + at home pregnancy test.

## 2018-03-05 NOTE — ED Notes (Signed)
Patient transported to Ultrasound 

## 2018-03-05 NOTE — ED Notes (Signed)
ED Provider at bedside. 

## 2018-03-05 NOTE — Discharge Instructions (Signed)
Please seek medical attention for any high fevers, chest pain, shortness of breath, change in behavior, persistent vomiting, bloody stool or any other new or concerning symptoms.  

## 2018-03-05 NOTE — ED Provider Notes (Signed)
[facility] Emergency Department Provider Note   ____________________________________________   I have reviewed the triage vital signs and the nursing notes.   HISTORY  Chief Complaint Abdominal Pain   History limited by: Not Limited   HPI Carla Garrett is a 32 y.o. female who presents to the emergency department today because of concerns for abdominal pain.  Located in the right lower quadrant.  The patient states it started yesterday.  The patient states that she is also had some vaginal spotting.  Patient did take a recent positive pregnancy test however is unsure when she might of became pregnant.  She states her periods have been irregular since her IUD was removed a number of months ago.  Patient does have a history of IBS but states this feels different.  She has had some nausea.  Denies any fevers.   Per medical record review patient has a history of d and c.  Past Medical History:  Diagnosis Date  . Back pain   . Chlamydia   . Depression   . Eczema   . Headache   . Hemorrhoids   . Obesity (BMI 30-39.9)   . Post traumatic stress disorder (PTSD)     There are no active problems to display for this patient.   Past Surgical History:  Procedure Laterality Date  . COLONOSCOPY WITH PROPOFOL N/A 05/19/2015   Procedure: COLONOSCOPY WITH PROPOFOL;  Surgeon: Hulen Luster, MD;  Location: [facility] ENDOSCOPY;  Service: Gastroenterology;  Laterality: N/A;  . DILATION AND CURETTAGE, DIAGNOSTIC / THERAPEUTIC    . EYE SURGERY    . TONSILLECTOMY      Prior to Admission medications   Medication Sig Start Date End Date Taking? Authorizing Provider  cetirizine (ZYRTEC) 10 MG tablet Take 10 mg by mouth daily.    [provider]  fluticasone (FLONASE) 50 MCG/ACT nasal spray Place 2 sprays into both nostrils daily. 03/25/17 04/08/17  Duffy Bruce, MD  levonorgestrel (MIRENA) 20 MCG/24HR IUD 1 each by Intrauterine route once.    [provider]   meclizine (ANTIVERT) 25 MG tablet Take 1 tablet (25 mg total) by mouth 3 (three) times daily as needed for dizziness. 03/25/17   Duffy Bruce, MD  saccharomyces boulardii (FLORASTOR) 250 MG capsule Take 250 mg by mouth 2 (two) times daily.    [provider]  sertraline (ZOLOFT) 100 MG tablet Take 200 mg by mouth daily.    [provider]    Allergies Patient has no known allergies.  No family history on file.  Social History Social History   Tobacco Use  . Smoking status: Former Research scientist (life sciences)  . Smokeless tobacco: Never Used  Substance Use Topics  . Alcohol use: No  . Drug use: No    Review of Systems Constitutional: No fever/chills Eyes: No visual changes. ENT: No sore throat. Cardiovascular: Denies chest pain. Respiratory: Denies shortness of breath. Gastrointestinal: Positive for right lower abdominal pain. Positive for nausea.  Genitourinary: Positive for vaginal spotting.  Musculoskeletal: Negative for back pain. Skin: Negative for rash. Neurological: Negative for headaches, focal weakness or numbness.  ____________________________________________   PHYSICAL EXAM:  VITAL SIGNS: ED Triage Vitals  Enc Vitals Group     BP 03/05/18 1910 (!) 148/82     Pulse Rate 03/05/18 1910 72     Resp 03/05/18 1910 18     Temp 03/05/18 1910 98.7 F (37.1 C)     Temp Source 03/05/18 1910 Oral     SpO2  03/05/18 1910 100 %     Weight 03/05/18 1911 203 lb (92.1 kg)     Height 03/05/18 1911 5\' 2"  (1.575 m)     Head Circumference --      Peak Flow --      Pain Score 03/05/18 1910 6   Constitutional: Alert and oriented.  Eyes: Conjunctivae are normal.  ENT      Head: Normocephalic and atraumatic.      Nose: No congestion/rhinnorhea.      Mouth/Throat: Mucous membranes are moist.      Neck: No stridor. Hematological/Lymphatic/Immunilogical: No cervical lymphadenopathy. Cardiovascular: Normal rate, regular rhythm.  No murmurs, rubs, or gallops. Respiratory:  Normal respiratory effort without tachypnea nor retractions. Breath sounds are clear and equal bilaterally. No wheezes/rales/rhonchi. Gastrointestinal: Soft and non tender. No rebound. No guarding.  Genitourinary: Deferred Musculoskeletal: Normal range of motion in all extremities. Neurologic:  Normal speech and language. No gross focal neurologic deficits are appreciated.  Skin:  Skin is warm, dry and intact. No rash noted. Psychiatric: Mood and affect are normal. Speech and behavior are normal. Patient exhibits appropriate insight and judgment.  ____________________________________________    LABS (pertinent positives/negatives)  hcg 20840 O neg CBC wbc 12.4 hgb 13.7, plt 261 CMP wnl UA not consistent with infection  ____________________________________________   EKG  None  ____________________________________________    RADIOLOGY  [place] Single live IUP ____________________________________________   PROCEDURES  Procedures  ____________________________________________   INITIAL IMPRESSION / ASSESSMENT AND PLAN / ED COURSE  Pertinent labs & imaging results that were available during my care of the patient were reviewed by me and considered in my medical decision making (see chart for details).   Patient presented to the emergency department today because of concerns for abdominal pain and vaginal bleeding in the setting of pregnancy of unknown duration.  On exam patient did not have any significant abdominal tenderness.  Beta-hCG in the serum 20,000.  Patient will have ultrasound performed.  Additionally patient was Rh-.  Will order RhoGam.   ____________________________________________   FINAL CLINICAL IMPRESSION(S) / ED DIAGNOSES  Final diagnoses:  Threatened abortion     Note: This dictation was prepared with Dragon dictation. Any transcriptional errors that result from this process are unintentional     Nance Pear, MD 03/06/18 1506

## 2018-03-05 NOTE — ED Triage Notes (Signed)
Patient reports having right lower abdominal pain since yesterday.  Also reports vaginal spotting.  States +pregnancy but unsure how far along.  Reports had IUD removed in April and has not had regular menstrual since then.

## 2018-03-06 LAB — RHOGAM INJECTION: Unit division: 0

## 2018-03-06 NOTE — ED Notes (Addendum)
Reviewed discharge instructions andfollow-up care with patient. Patient verbalized understanding of all information reviewed. Patient stable, with no distress noted at this time.  Patient denies any symptoms of allergic reaction to RHIG injection.

## 2018-03-08 ENCOUNTER — Emergency Department: Payer: Self-pay

## 2018-03-08 ENCOUNTER — Encounter: Payer: Self-pay | Admitting: Emergency Medicine

## 2018-03-08 ENCOUNTER — Emergency Department
Admission: EM | Admit: 2018-03-08 | Discharge: 2018-03-08 | Disposition: A | Discharge status: Home / Self Care | Payer: Self-pay | Attending: Emergency Medicine | Admitting: Emergency Medicine

## 2018-03-08 DIAGNOSIS — Z79899 Other long term (current) drug therapy: Secondary | ICD-10-CM | POA: Insufficient documentation

## 2018-03-08 DIAGNOSIS — O200 Threatened abortion: Secondary | ICD-10-CM | POA: Insufficient documentation

## 2018-03-08 DIAGNOSIS — Z3A01 Less than 8 weeks gestation of pregnancy: Secondary | ICD-10-CM | POA: Insufficient documentation

## 2018-03-08 DIAGNOSIS — Z87891 Personal history of nicotine dependence: Secondary | ICD-10-CM | POA: Insufficient documentation

## 2018-03-08 LAB — COMPREHENSIVE METABOLIC PANEL WITH GFR
ALT: 16 U/L (ref 0–44)
AST: 17 U/L (ref 15–41)
Albumin: 4.4 g/dL (ref 3.5–5.0)
Alkaline Phosphatase: 59 U/L (ref 38–126)
Anion gap: 8 (ref 5–15)
BUN: 8 mg/dL (ref 6–20)
CO2: 23 mmol/L (ref 22–32)
Calcium: 9.2 mg/dL (ref 8.9–10.3)
Chloride: 104 mmol/L (ref 98–111)
Creatinine, Ser: 0.56 mg/dL (ref 0.44–1.00)
GFR calc Af Amer: >60 mL/min (ref >60)
GFR calc non Af Amer: >60 mL/min (ref >60)
Glucose, Bld: 90 mg/dL (ref 70–99)
Potassium: 3.8 mmol/L (ref 3.5–5.1)
Sodium: 135 mmol/L (ref 135–145)
Total Bilirubin: 0.7 mg/dL (ref 0.3–1.2)
Total Protein: 7.4 g/dL (ref 6.5–8.1)

## 2018-03-08 LAB — CBC WITH DIFFERENTIAL/PLATELET
Basophils Absolute: 0.1 10*3/uL (ref 0–0.1)
Basophils Relative: 1 %
EOS PCT: 4 %
Eosinophils Absolute: 0.5 10*3/uL (ref 0–0.7)
HCT: 38.7 % (ref 35.0–47.0)
Hemoglobin: 12.9 g/dL (ref 12.0–16.0)
Lymphocytes Relative: 30 %
Lymphs Abs: 3.4 10*3/uL (ref 1.0–3.6)
MCH: 29.5 pg (ref 26.0–34.0)
MCHC: 33.4 g/dL (ref 32.0–36.0)
MCV: 88.3 fL (ref 80.0–100.0)
MONO ABS: 0.6 10*3/uL (ref 0.2–0.9)
MONOS PCT: 5 %
Neutro Abs: 6.9 10*3/uL — ABNORMAL HIGH (ref 1.4–6.5)
Neutrophils Relative %: 60 %
PLATELETS: 263 10*3/uL (ref 150–440)
RBC: 4.38 MIL/uL (ref 3.80–5.20)
RDW: 14 % (ref 11.5–14.5)
WBC: 11.4 10*3/uL — ABNORMAL HIGH (ref 3.6–11.0)

## 2018-03-08 LAB — HCG, QUANTITATIVE, PREGNANCY: hCG, Beta Chain, Quant, S: 27221 m[IU]/mL — ABNORMAL HIGH (ref <5)

## 2018-03-08 NOTE — ED Triage Notes (Signed)
PT is [redacted] weeks pregnant with 4th pregnancy. Pt concerned over vaginal bleeding and the passing of a clot today. Pt reports mild cramping. Pt has ultrasound on Monday. Pt states she is o-  And that she was just given rhogam

## 2018-03-08 NOTE — ED Notes (Signed)
FIRST NURSE NOTE:  Pt reports she was seen here recently thinks she is having a miscarriage. No distress noted at this time.

## 2018-03-08 NOTE — ED Notes (Signed)
Pt given water to drink. 

## 2018-03-08 NOTE — ED Notes (Signed)
Pt started bleeding today and in concerned about miscarriage. Pt denies any N/V or dizziness

## 2018-03-08 NOTE — ED Provider Notes (Signed)
[facility] Emergency Department Provider Note  Time seen: 6:46 PM  I have reviewed the triage vital signs and the nursing notes.   HISTORY  Chief Complaint Vaginal Bleeding    HPI Carla Garrett is a 32 y.o. female with a past medical history of obesity, PTSD, G4 P2 A1 at approximately 6 weeks who presents to the emergency department for vaginal bleeding.  According to the patient for the past several days she has been expensing intermittent vaginal bleeding.  Was seen in the emergency department 3 days ago for the same.  Patient had an ultrasound showing a 5-week 6-day fetus at that time was also given RhoGam as the patient has a negative blood type.  Patient states today she noticed some clot in her vaginal bleeding which is what brought her to the emergency department.  Denies any abdominal cramping or pain.  Largely negative review of systems otherwise.   Past Medical History:  Diagnosis Date  . Back pain   . Chlamydia   . Depression   . Eczema   . Headache   . Hemorrhoids   . Obesity (BMI 30-39.9)   . Post traumatic stress disorder (PTSD)     There are no active problems to display for this patient.   Past Surgical History:  Procedure Laterality Date  . COLONOSCOPY WITH PROPOFOL N/A 05/19/2015   Procedure: COLONOSCOPY WITH PROPOFOL;  Surgeon: Hulen Luster, MD;  Location: [facility] ENDOSCOPY;  Service: Gastroenterology;  Laterality: N/A;  . DILATION AND CURETTAGE, DIAGNOSTIC / THERAPEUTIC    . EYE SURGERY    . TONSILLECTOMY      Prior to Admission medications   Medication Sig Start Date End Date Taking? Authorizing Provider  cetirizine (ZYRTEC) 10 MG tablet Take 10 mg by mouth daily.    [provider]  fluticasone (FLONASE) 50 MCG/ACT nasal spray Place 2 sprays into both nostrils daily. 03/25/17 04/08/17  Duffy Bruce, MD  levonorgestrel (MIRENA) 20 MCG/24HR IUD 1 each by Intrauterine route once.    [provider]  meclizine  (ANTIVERT) 25 MG tablet Take 1 tablet (25 mg total) by mouth 3 (three) times daily as needed for dizziness. 03/25/17   Duffy Bruce, MD  saccharomyces boulardii (FLORASTOR) 250 MG capsule Take 250 mg by mouth 2 (two) times daily.    [provider]  sertraline (ZOLOFT) 100 MG tablet Take 200 mg by mouth daily.    [provider]    No Known Allergies  No family history on file.  Social History Social History   Tobacco Use  . Smoking status: Former Research scientist (life sciences)  . Smokeless tobacco: Never Used  Substance Use Topics  . Alcohol use: No  . Drug use: No    Review of Systems Constitutional: Negative for fever. Cardiovascular: Negative for chest pain. Respiratory: Negative for shortness of breath. Gastrointestinal: Negative for abdominal pain, vomiting  Genitourinary: Positive for vaginal bleeding with occasional clot.   Musculoskeletal: Negative for musculoskeletal complaints Skin: Negative for skin complaints  Neurological: Negative for headache All other ROS negative  ____________________________________________   PHYSICAL EXAM:  VITAL SIGNS: ED Triage Vitals  Enc Vitals Group     BP 03/08/18 1730 136/70     Pulse Rate 03/08/18 1730 67     Resp 03/08/18 1730 18     Temp 03/08/18 1730 98.9 F (37.2 C)     Temp Source 03/08/18 1730 Oral     SpO2 03/08/18 1730 99 %     Weight  03/08/18 1731 203 lb (92.1 kg)     Height 03/08/18 1731 5\' 2"  (1.575 m)     Head Circumference --      Peak Flow --      Pain Score 03/08/18 1731 3     Pain Loc --      Pain Edu? --      Excl. in [place]? --    Constitutional: Alert and oriented. Well appearing and in no distress. Eyes: Normal exam ENT   Head: Normocephalic and atraumatic   Mouth/Throat: Mucous membranes are moist. Cardiovascular: Normal rate, regular rhythm. No murmur Respiratory: Normal respiratory effort without tachypnea nor retractions. Breath sounds are clear  Gastrointestinal: Soft and nontender. No  distention.   Musculoskeletal: Nontender with normal range of motion in all extremities.  Neurologic:  Normal speech and language. No gross focal neurologic deficits  Skin:  Skin is warm, dry and intact.  Psychiatric: Mood and affect are normal.   ____________________________________________   INITIAL IMPRESSION / ASSESSMENT AND PLAN / ED COURSE  Pertinent labs & imaging results that were available during my care of the patient were reviewed by me and considered in my medical decision making (see chart for details).  Patient presents to the emergency department for continued vaginal bleeding.  Patient had an ultrasound performed 3 days ago showing a 6-week pregnancy with likely heartbeat.  With the patient had a beta-hCG of 20,000.  We will recheck labs including her current beta-hCG.  If the beta-hCG is progressing normally we would expect it to be in the 50,000 range today if it is a miscarriage would expect it to be considerably less.  Patient agreeable to plan of care.  As the patient received RhoGam 3 days ago do not believe the patient requires any further RhoGam today.  Beta-hCG is only 27,000, lower than expected.  Ultrasound today continues to serve a single live intrauterine pregnancy at 6 weeks and 1 day with a heart rate of 117.  I discussed with the patient follow-up with an OB for recheck of labs and ultrasound within the next 1 to 2 weeks.  Patient agreeable plan of care.  ____________________________________________   FINAL CLINICAL IMPRESSION(S) / ED DIAGNOSES  Threatened miscarriage    Harvest Dark, MD 03/08/18 2114

## 2018-03-24 ENCOUNTER — Encounter: Payer: Self-pay | Admitting: Emergency Medicine

## 2018-03-24 ENCOUNTER — Emergency Department
Admission: EM | Admit: 2018-03-24 | Discharge: 2018-03-24 | Discharge status: Against Medical Advice | Payer: Self-pay | Attending: Emergency Medicine | Admitting: Emergency Medicine

## 2018-03-24 DIAGNOSIS — Z5321 Procedure and treatment not carried out due to patient leaving prior to being seen by health care provider: Secondary | ICD-10-CM | POA: Insufficient documentation

## 2018-03-24 DIAGNOSIS — Z3A01 Less than 8 weeks gestation of pregnancy: Secondary | ICD-10-CM | POA: Insufficient documentation

## 2018-03-24 DIAGNOSIS — O26851 Spotting complicating pregnancy, first trimester: Secondary | ICD-10-CM | POA: Insufficient documentation

## 2018-03-24 LAB — CBC WITH DIFFERENTIAL/PLATELET
BASOS ABS: 0.1 10*3/uL (ref 0–0.1)
BASOS PCT: 1 %
EOS ABS: 0.3 10*3/uL (ref 0–0.7)
EOS PCT: 3 %
HCT: 38.2 % (ref 35.0–47.0)
HEMOGLOBIN: 12.9 g/dL (ref 12.0–16.0)
LYMPHS ABS: 2.5 10*3/uL (ref 1.0–3.6)
Lymphocytes Relative: 26 %
MCH: 30.2 pg (ref 26.0–34.0)
MCHC: 33.9 g/dL (ref 32.0–36.0)
MCV: 89 fL (ref 80.0–100.0)
MONOS PCT: 6 %
Monocytes Absolute: 0.5 10*3/uL (ref 0.2–0.9)
NEUTROS ABS: 6.1 10*3/uL (ref 1.4–6.5)
NEUTROS PCT: 64 %
PLATELETS: 247 10*3/uL (ref 150–440)
RBC: 4.29 MIL/uL (ref 3.80–5.20)
RDW: 13.8 % (ref 11.5–14.5)
WBC: 9.6 10*3/uL (ref 3.6–11.0)

## 2018-03-24 LAB — HCG, QUANTITATIVE, PREGNANCY: hCG, Beta Chain, Quant, S: 101580 m[IU]/mL — ABNORMAL HIGH (ref <5)

## 2018-03-24 NOTE — ED Triage Notes (Signed)
First Nurse Note:  Arrives for ED evaluation of vaginal spotting.  Patient is pregnant, will be a patient at [facility].  G4 P3.  [facility] April 2020.  AAOx3.  Skin warm and dry. NAD

## 2018-03-24 NOTE — ED Triage Notes (Signed)
Pt arrives with complaints of mild bleeding and cramping for the last 4 days. Pt reports being [redacted] weeks pregnant.

## 2018-03-27 ENCOUNTER — Telehealth: Payer: Self-pay | Admitting: Emergency Medicine

## 2018-03-27 NOTE — Telephone Encounter (Signed)
Called patient due to lwot to inquire about condition and follow up plans.  No answer and no voicemail  

## 2018-04-13 LAB — OB RESULTS CONSOLE GC/CHLAMYDIA
Chlamydia: NEGATIVE
Gonorrhea: NEGATIVE

## 2018-04-14 LAB — OB RESULTS CONSOLE VARICELLA ZOSTER ANTIBODY, IGG: Varicella: IMMUNE

## 2018-04-14 LAB — OB RESULTS CONSOLE HEPATITIS B SURFACE ANTIGEN: Hepatitis B Surface Ag: NEGATIVE

## 2018-04-14 LAB — OB RESULTS CONSOLE RUBELLA ANTIBODY, IGM: Rubella: IMMUNE

## 2018-04-14 LAB — OB RESULTS CONSOLE HIV ANTIBODY (ROUTINE TESTING): HIV: NONREACTIVE

## 2018-04-14 LAB — OB RESULTS CONSOLE RPR: RPR: NONREACTIVE

## 2018-05-08 ENCOUNTER — Emergency Department: Payer: Self-pay

## 2018-05-08 ENCOUNTER — Other Ambulatory Visit: Payer: Self-pay

## 2018-05-08 ENCOUNTER — Emergency Department
Admission: EM | Admit: 2018-05-08 | Discharge: 2018-05-08 | Disposition: A | Discharge status: Home / Self Care | Payer: Self-pay | Attending: Emergency Medicine | Admitting: Emergency Medicine

## 2018-05-08 DIAGNOSIS — Z3A14 14 weeks gestation of pregnancy: Secondary | ICD-10-CM | POA: Insufficient documentation

## 2018-05-08 DIAGNOSIS — O4402 Placenta previa specified as without hemorrhage, second trimester: Secondary | ICD-10-CM

## 2018-05-08 DIAGNOSIS — Z87891 Personal history of nicotine dependence: Secondary | ICD-10-CM | POA: Insufficient documentation

## 2018-05-08 DIAGNOSIS — O4412 Placenta previa with hemorrhage, second trimester: Secondary | ICD-10-CM | POA: Insufficient documentation

## 2018-05-08 LAB — COMPREHENSIVE METABOLIC PANEL
ALBUMIN: 4 g/dL (ref 3.5–5.0)
ALK PHOS: 55 U/L (ref 38–126)
ALT: 12 U/L (ref 0–44)
AST: 18 U/L (ref 15–41)
Anion gap: 5 (ref 5–15)
BILIRUBIN TOTAL: 0.5 mg/dL (ref 0.3–1.2)
BUN: 8 mg/dL (ref 6–20)
CO2: 25 mmol/L (ref 22–32)
Calcium: 9.2 mg/dL (ref 8.9–10.3)
Chloride: 108 mmol/L (ref 98–111)
Creatinine, Ser: 0.43 mg/dL — ABNORMAL LOW (ref 0.44–1.00)
GFR calc Af Amer: >60 mL/min (ref >60)
GFR calc non Af Amer: >60 mL/min (ref >60)
GLUCOSE: 91 mg/dL (ref 70–99)
POTASSIUM: 4 mmol/L (ref 3.5–5.1)
Sodium: 138 mmol/L (ref 135–145)
Total Protein: 7.2 g/dL (ref 6.5–8.1)

## 2018-05-08 LAB — CBC WITH DIFFERENTIAL/PLATELET
ABS IMMATURE GRANULOCYTES: 0.06 10*3/uL (ref 0.00–0.07)
BASOS ABS: 0.1 10*3/uL (ref 0.0–0.1)
Basophils Relative: 1 %
Eosinophils Absolute: 0.5 10*3/uL (ref 0.0–0.5)
Eosinophils Relative: 4 %
HEMATOCRIT: 40.9 % (ref 36.0–46.0)
HEMOGLOBIN: 13.2 g/dL (ref 12.0–15.0)
IMMATURE GRANULOCYTES: 1 %
LYMPHS ABS: 3 10*3/uL (ref 0.7–4.0)
LYMPHS PCT: 27 %
MCH: 28.9 pg (ref 26.0–34.0)
MCHC: 32.3 g/dL (ref 30.0–36.0)
MCV: 89.5 fL (ref 80.0–100.0)
MONOS PCT: 5 %
Monocytes Absolute: 0.6 10*3/uL (ref 0.1–1.0)
NEUTROS ABS: 7.2 10*3/uL (ref 1.7–7.7)
NEUTROS PCT: 62 %
NRBC: 0 % (ref 0.0–0.2)
Platelets: 272 10*3/uL (ref 150–400)
RBC: 4.57 MIL/uL (ref 3.87–5.11)
RDW: 12.8 % (ref 11.5–15.5)
WBC: 11.4 10*3/uL — ABNORMAL HIGH (ref 4.0–10.5)

## 2018-05-08 LAB — ABO/RH: ABO/RH(D): O NEG

## 2018-05-08 LAB — URINALYSIS, COMPLETE (UACMP) WITH MICROSCOPIC
BILIRUBIN URINE: NEGATIVE
GLUCOSE, UA: NEGATIVE mg/dL
KETONES UR: 5 mg/dL — AB
Nitrite: NEGATIVE
PROTEIN: NEGATIVE mg/dL
Specific Gravity, Urine: 1.024 (ref 1.005–1.030)
pH: 6 (ref 5.0–8.0)

## 2018-05-08 LAB — POCT PREGNANCY, URINE: Preg Test, Ur: POSITIVE — AB

## 2018-05-08 LAB — HCG, QUANTITATIVE, PREGNANCY: hCG, Beta Chain, Quant, S: 17863 m[IU]/mL — ABNORMAL HIGH (ref <5)

## 2018-05-08 MED ORDER — ACETAMINOPHEN 500 MG PO TABS
1000.0000 mg | ORAL_TABLET | Freq: Once | ORAL | Status: AC
  Administered 2018-05-08: 1000 mg via ORAL
  Filled 2018-05-08: qty 2

## 2018-05-08 NOTE — ED Notes (Signed)
Pt returned from ultrasound

## 2018-05-08 NOTE — ED Notes (Signed)
Pt went to ultrasound.

## 2018-05-08 NOTE — ED Provider Notes (Signed)
[facility] Emergency Department Provider Note       Time seen: ----------------------------------------- 8:23 PM on 05/08/2018 -----------------------------------------   I have reviewed the triage vital signs and the nursing notes.  HISTORY   Chief Complaint Vaginal Bleeding    HPI Carla Garrett is a 32 y.o. female with a history of depression, obesity, PTSD who presents to the ED for abdominal cramping and vaginal bleeding that started tonight.  Patient states he was brisk bleeding but has subsided to only mild bleeding currently.  She is G4, P2 Ab1.  She did have bleeding earlier on this pregnancy but that has resolved over the past month.  Past Medical History:  Diagnosis Date  . Back pain   . Chlamydia   . Depression   . Eczema   . Headache   . Hemorrhoids   . Obesity (BMI 30-39.9)   . Post traumatic stress disorder (PTSD)     There are no active problems to display for this patient.   Past Surgical History:  Procedure Laterality Date  . COLONOSCOPY WITH PROPOFOL N/A 05/19/2015   Procedure: COLONOSCOPY WITH PROPOFOL;  Surgeon: Hulen Luster, MD;  Location: [facility] ENDOSCOPY;  Service: Gastroenterology;  Laterality: N/A;  . DILATION AND CURETTAGE, DIAGNOSTIC / THERAPEUTIC    . EYE SURGERY    . TONSILLECTOMY      Allergies Patient has no known allergies.  Social History Social History   Tobacco Use  . Smoking status: Former Research scientist (life sciences)  . Smokeless tobacco: Never Used  Substance Use Topics  . Alcohol use: No  . Drug use: No   Review of Systems Constitutional: Negative for fever. Cardiovascular: Negative for chest pain. Respiratory: Negative for shortness of breath. Gastrointestinal: Positive for abdominal cramping Genitourinary: Negative for dysuria. positive for vaginal bleeding Musculoskeletal: Negative for back pain. Skin: Negative for rash. Neurological: Negative for headaches, focal weakness or numbness.  All systems  negative/normal/unremarkable except as stated in the HPI  ____________________________________________   PHYSICAL EXAM:  VITAL SIGNS: ED Triage Vitals  Enc Vitals Group     BP 05/08/18 1806 132/76     Pulse Rate 05/08/18 1806 76     Resp 05/08/18 1806 18     Temp 05/08/18 1806 98.4 F (36.9 C)     Temp Source 05/08/18 1806 Oral     SpO2 05/08/18 1806 99 %     Weight 05/08/18 1807 203 lb (92.1 kg)     Height 05/08/18 1807 5\' 2"  (1.575 m)     Head Circumference --      Peak Flow --      Pain Score 05/08/18 1807 3     Pain Loc --      Pain Edu? --      Excl. in [place]? --    Constitutional: Alert and oriented. Well appearing and in no distress. Eyes: Conjunctivae are normal. Normal extraocular movements. Cardiovascular: Normal rate, regular rhythm. No murmurs, rubs, or gallops. Respiratory: Normal respiratory effort without tachypnea nor retractions. Breath sounds are clear and equal bilaterally. No wheezes/rales/rhonchi. Gastrointestinal: Soft and nontender. Normal bowel sounds Musculoskeletal: Nontender with normal range of motion in extremities. No lower extremity tenderness nor edema. Neurologic:  Normal speech and language. No gross focal neurologic deficits are appreciated.  Skin:  Skin is warm, dry and intact. No rash noted. Psychiatric: Mood and affect are normal. Speech and behavior are normal.  ____________________________________________  ED COURSE:  As part of my medical decision making, I reviewed the following  data within the [place] History obtained from family if available, nursing notes, old chart and ekg, as well as notes from prior ED visits. Patient presented for vaginal bleeding and abdominal cramping, we will assess with labs and imaging as indicated at this time.   Procedures ____________________________________________   LABS (pertinent positives/negatives)  Labs Reviewed  HCG, QUANTITATIVE, PREGNANCY - Abnormal; Notable for the  following components:      Result Value   hCG, Beta Chain, Quant, S [id number] (*)    All other components within normal limits  CBC WITH DIFFERENTIAL/PLATELET - Abnormal; Notable for the following components:   WBC 11.4 (*)    All other components within normal limits  COMPREHENSIVE METABOLIC PANEL - Abnormal; Notable for the following components:   Creatinine, Ser 0.43 (*)    All other components within normal limits  URINALYSIS, COMPLETE (UACMP) WITH MICROSCOPIC - Abnormal; Notable for the following components:   Color, Urine YELLOW (*)    APPearance HAZY (*)    Hgb urine dipstick LARGE (*)    Ketones, ur 5 (*)    Leukocytes, UA TRACE (*)    Bacteria, UA RARE (*)    All other components within normal limits  POCT PREGNANCY, URINE - Abnormal; Notable for the following components:   Preg Test, Ur POSITIVE (*)    All other components within normal limits  POC URINE PREG, ED  ABO/RH    RADIOLOGY Images were viewed by me Pregnancy ultrasound IMPRESSION: Single living intrauterine gestation with assigned gestational age of [redacted] weeks 6 days. No acute abnormality  Marginal placenta previa  This exam is performed on an emergent basis and does not comprehensively evaluate fetal size, dating, or anatomy; follow-up complete OB US should be considered if further fetal assessment is warranted.  ____________________________________________  DIFFERENTIAL DIAGNOSIS   Miscarriage, abruption, placenta previa, UTI  FINAL ASSESSMENT AND PLAN  Placenta previa   Plan: The patient had presented for vaginal bleeding. Patient's labs are unremarkable. Patient's imaging did reveal marginal placenta previa with a pregnancy of 14 weeks 6 days.  Since the bleeding is mild at this time I discussed with Dr. Leonides Schanz from the OB/GYN service.  She has advise repeat ultrasound this week.  I will have the patient call in the morning for recheck.   Laurence Aly, MD   Note: This note was  generated in part or whole with voice recognition software. Voice recognition is usually quite accurate but there are transcription errors that can and very often do occur. I apologize for any typographical errors that were not detected and corrected.     Earleen Newport, MD 05/08/18 2026

## 2018-05-08 NOTE — ED Triage Notes (Signed)
Pt c/o abdominal cramping and vaginal bleeding that started tonight. Pt [redacted] weeks pregnant.

## 2018-07-05 NOTE — L&D Delivery Note (Addendum)
Date of delivery: 10/24/2018 Estimated Date of Delivery: 10/30/18 Patient's last menstrual period was 01/26/2018 (approximate). EGA: [redacted]w[redacted]d  Delivery Note At 1:28 AM a viable female was delivered via Vaginal, Spontaneous (Presentation: cephalic; direct OA).  APGAR: 8, 9; weight: pending Placenta status: spontaneous, intact.  Cord: 3vv with the following complications: tight nuchal x1.  Cord pH: not collected  Anesthesia:  epidural Episiotomy: None Lacerations: None Suture Repair: n/a Est. Blood Loss (mL): 650cc (measured)  Mom presented to L&D with elective IOL, PCN given for GBS ppx. Started with pitocin, then AROM.  epidual placed. Category 2 tracing, and pitocin discontinued.  Eventually restarted, progressed to complete, second stage: 10 minutes. Perfectly timed with the amazing song "Circle of Life", delivery of fetal head with restitution to LOT. Tight nuchal encountered and unable to be reduced at perineum.  Anterior then posterior shoulders delivered without difficulty, cord reduced at that time.  Baby placed on mom's chest, and attended to by peds.  Cord was then clamped and cut by FOB after a >60 second delay.  Placenta spontaneously delivered, intact.   IV pitocin given for hemorrhage prophylaxis.   We sang happy birthday to baby Royal.  Mom to postpartum.  Baby to Couplet care / Skin to Skin.  Juda Toepfer C Ziair Penson 10/24/2018, 2:12 AM

## 2018-08-20 ENCOUNTER — Observation Stay
Admission: EM | Admit: 2018-08-20 | Discharge: 2018-08-21 | Disposition: A | Discharge status: Home / Self Care | Payer: Self-pay | Attending: Obstetrics and Gynecology | Admitting: Obstetrics and Gynecology

## 2018-08-20 ENCOUNTER — Other Ambulatory Visit: Payer: Self-pay

## 2018-08-20 DIAGNOSIS — Z87891 Personal history of nicotine dependence: Secondary | ICD-10-CM | POA: Insufficient documentation

## 2018-08-20 DIAGNOSIS — Z7951 Long term (current) use of inhaled steroids: Secondary | ICD-10-CM | POA: Insufficient documentation

## 2018-08-20 DIAGNOSIS — O99343 Other mental disorders complicating pregnancy, third trimester: Secondary | ICD-10-CM | POA: Insufficient documentation

## 2018-08-20 DIAGNOSIS — O99213 Obesity complicating pregnancy, third trimester: Secondary | ICD-10-CM | POA: Insufficient documentation

## 2018-08-20 DIAGNOSIS — O368190 Decreased fetal movements, unspecified trimester, not applicable or unspecified: Secondary | ICD-10-CM | POA: Diagnosis present

## 2018-08-20 DIAGNOSIS — Z3A29 29 weeks gestation of pregnancy: Secondary | ICD-10-CM | POA: Insufficient documentation

## 2018-08-20 DIAGNOSIS — O368130 Decreased fetal movements, third trimester, not applicable or unspecified: Principal | ICD-10-CM | POA: Insufficient documentation

## 2018-08-20 DIAGNOSIS — F419 Anxiety disorder, unspecified: Secondary | ICD-10-CM | POA: Insufficient documentation

## 2018-08-20 DIAGNOSIS — F4310 Post-traumatic stress disorder, unspecified: Secondary | ICD-10-CM | POA: Insufficient documentation

## 2018-08-20 DIAGNOSIS — Z79899 Other long term (current) drug therapy: Secondary | ICD-10-CM | POA: Insufficient documentation

## 2018-08-20 NOTE — OB Triage Note (Signed)
Pt reports decreased fetal movement today.  Pt denies LOF or bleeding.  Pt states she felt baby move on the way up from the ER.

## 2018-08-20 NOTE — Discharge Summary (Signed)
Carla Garrett is a 33 y.o. female. She is at [redacted]w[redacted]d gestation. Patient's last menstrual period was 01/26/2018 (approximate). Estimated Date of Delivery: 10/30/18  Prenatal care site: [facility]   Current pregnancy complicated by:  1.  ASCUS pap with +HPV, colpo done; Plan: repeat Pap PP 2.  2nd tri vaginal bleed 05/08/18; posterior plac 3.37cm from os; pelvic rest. Resolved at 20 week anat scan. RH Neg, received rhogam 03/2018, 08/14/18  3. 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 4. PTSD, stable on Zoloft 100mg  PO daily, rx'd Klonopin by Dr. Jimmye Norman, 0.25 PRN, Therapy routinely at [facility] hospital in [place]. 5. Smoking, 3-4 cigs/day - for anxiety reduction;  Started Nicroette gum 2mg  07/11/2018  Chief complaint: decreased fetal movement  Location: abdomen Onset/timing: today Duration: all day Quality: n/a Severity: n/a Aggravating or alleviating conditions: anxiety and PTSD, currently in counseling and on medications. Hx 33week IUFD.  Associated signs/symptoms: reports general pregnancy discomforts including pelvic pressure and sciatica.  Context: n/a  S: Resting comfortably. no CTX, no VB.no LOF,  Active fetal movement noted since arrival in ER.  Denies: HA, visual changes, SOB, or RUQ/epigastric pain  Maternal Medical History:   Past Medical History:  Diagnosis Date  . Back pain   . Chlamydia   . Depression   . Eczema   . Headache   . Hemorrhoids   . Obesity (BMI 30-39.9)   . Post traumatic stress disorder (PTSD)     Past Surgical History:  Procedure Laterality Date  . COLONOSCOPY WITH PROPOFOL N/A 05/19/2015   Procedure: COLONOSCOPY WITH PROPOFOL;  Surgeon: Hulen Luster, MD;  Location: [facility] ENDOSCOPY;  Service: Gastroenterology;  Laterality: N/A;  . DILATION AND CURETTAGE, DIAGNOSTIC / THERAPEUTIC    . EYE SURGERY    . TONSILLECTOMY      No Known Allergies  Prior to Admission medications   Medication Sig Start Date End Date Taking? Authorizing Provider  sertraline  (ZOLOFT) 100 MG tablet Take 200 mg by mouth daily.   Yes [provider]  cetirizine (ZYRTEC) 10 MG tablet Take 10 mg by mouth daily.    [provider]  fluticasone (FLONASE) 50 MCG/ACT nasal spray Place 2 sprays into both nostrils daily. 03/25/17 04/08/17  Duffy Bruce, MD  levonorgestrel (MIRENA) 20 MCG/24HR IUD 1 each by Intrauterine route once.    [provider]  meclizine (ANTIVERT) 25 MG tablet Take 1 tablet (25 mg total) by mouth 3 (three) times daily as needed for dizziness. Patient not taking: Reported on 08/20/2018 03/25/17   Duffy Bruce, MD  saccharomyces boulardii (FLORASTOR) 250 MG capsule Take 250 mg by mouth 2 (two) times daily.    [provider]      Social History: She  reports that she has quit smoking. She has never used smokeless tobacco. She reports that she does not drink alcohol or use drugs.  Family History: no history of gyn cancers  Review of Systems: A full review of systems was performed and negative except as noted in the HPI.     O:  BP 114/67 (BP Location: Right Arm)   Pulse 90   Temp 97.9 F (36.6 C) (Oral)   Resp 16   LMP 01/26/2018 (Approximate)  No results found for this or any previous visit (from the past 48 hour(s)).   Constitutional: NAD, AAOx3  HE/ENT: extraocular movements grossly intact, moist mucous membranes CV: RRR PULM: nl respiratory effort, CTABL     Abd: gravid, non-tender, non-distended, soft  Ext: Non-tender, Nonedematous   Psych: mood appropriate, speech normal Pelvic: deferred  Fetal  monitoring: Cat I Appropriate for GA, reactive NST  Baseline: 135bpm Variability: moderate Accelerations: present >2 Decelerations absent Time 55mins    A/P: 33 y.o. [redacted]w[redacted]d here for antenatal surveillance for decreased fetal movement  Principle Diagnosis:  High risk pregnancy in second trimester; decreased fetal movement,  [redacted]wks gestation   Fetal Wellbeing: Reassuring Cat 1  tracing.  Reactive NST   D/c home stable, precautions reviewed, follow-up as scheduled.    Francetta Found, CNM 08/20/2018  11:24 PM

## 2018-10-23 ENCOUNTER — Inpatient Hospital Stay: Payer: No Typology Code available for payment source | Admitting: Anesthesiology

## 2018-10-23 ENCOUNTER — Inpatient Hospital Stay
Admission: RE | Admit: 2018-10-23 | Discharge: 2018-10-25 | DRG: 806 | Disposition: A | Discharge status: Home / Self Care | Payer: No Typology Code available for payment source | Attending: Obstetrics & Gynecology | Admitting: Obstetrics & Gynecology

## 2018-10-23 ENCOUNTER — Other Ambulatory Visit: Payer: Self-pay

## 2018-10-23 DIAGNOSIS — O99344 Other mental disorders complicating childbirth: Secondary | ICD-10-CM | POA: Diagnosis present

## 2018-10-23 DIAGNOSIS — F419 Anxiety disorder, unspecified: Secondary | ICD-10-CM | POA: Diagnosis present

## 2018-10-23 DIAGNOSIS — D259 Leiomyoma of uterus, unspecified: Secondary | ICD-10-CM | POA: Diagnosis present

## 2018-10-23 DIAGNOSIS — O9081 Anemia of the puerperium: Secondary | ICD-10-CM | POA: Diagnosis present

## 2018-10-23 DIAGNOSIS — O26893 Other specified pregnancy related conditions, third trimester: Secondary | ICD-10-CM | POA: Diagnosis present

## 2018-10-23 DIAGNOSIS — O99824 Streptococcus B carrier state complicating childbirth: Secondary | ICD-10-CM | POA: Diagnosis present

## 2018-10-23 DIAGNOSIS — Z3A39 39 weeks gestation of pregnancy: Secondary | ICD-10-CM

## 2018-10-23 DIAGNOSIS — F4310 Post-traumatic stress disorder, unspecified: Secondary | ICD-10-CM | POA: Diagnosis present

## 2018-10-23 DIAGNOSIS — Z6791 Unspecified blood type, Rh negative: Secondary | ICD-10-CM

## 2018-10-23 DIAGNOSIS — F329 Major depressive disorder, single episode, unspecified: Secondary | ICD-10-CM | POA: Diagnosis present

## 2018-10-23 DIAGNOSIS — Z87891 Personal history of nicotine dependence: Secondary | ICD-10-CM | POA: Diagnosis not present

## 2018-10-23 DIAGNOSIS — O3413 Maternal care for benign tumor of corpus uteri, third trimester: Secondary | ICD-10-CM | POA: Diagnosis present

## 2018-10-23 DIAGNOSIS — D62 Acute posthemorrhagic anemia: Secondary | ICD-10-CM | POA: Diagnosis not present

## 2018-10-23 LAB — CBC
HCT: 33.7 % — ABNORMAL LOW (ref 36.0–46.0)
Hemoglobin: 10.9 g/dL — ABNORMAL LOW (ref 12.0–15.0)
MCH: 28.3 pg (ref 26.0–34.0)
MCHC: 32.3 g/dL (ref 30.0–36.0)
MCV: 87.5 fL (ref 80.0–100.0)
Platelets: 226 10*3/uL (ref 150–400)
RBC: 3.85 MIL/uL — ABNORMAL LOW (ref 3.87–5.11)
RDW: 14.1 % (ref 11.5–15.5)
WBC: 9.9 10*3/uL (ref 4.0–10.5)
nRBC: 0 % (ref 0.0–0.2)

## 2018-10-23 LAB — TYPE AND SCREEN
ABO/RH(D): O NEG
Antibody Screen: NEGATIVE

## 2018-10-23 MED ORDER — BUTORPHANOL TARTRATE 1 MG/ML IJ SOLN: 1.0000 mg | INTRAMUSCULAR | Status: DC | PRN

## 2018-10-23 MED ORDER — MISOPROSTOL 100 MCG PO TABS
25.0000 ug | ORAL_TABLET | ORAL | Status: DC | PRN
  Filled 2018-10-23: qty 1

## 2018-10-23 MED ORDER — OXYTOCIN BOLUS FROM INFUSION
500.0000 mL | Freq: Once | INTRAVENOUS | Status: AC
  Administered 2018-10-24: 500 mL via INTRAVENOUS

## 2018-10-23 MED ORDER — BUPIVACAINE HCL (PF) 0.25 % IJ SOLN
INTRAMUSCULAR | Status: DC | PRN
  Administered 2018-10-23 (×2): 4 mL via EPIDURAL

## 2018-10-23 MED ORDER — PHENYLEPHRINE 40 MCG/ML (10ML) SYRINGE FOR IV PUSH (FOR BLOOD PRESSURE SUPPORT)
80.0000 ug | PREFILLED_SYRINGE | INTRAVENOUS | Status: DC | PRN
  Filled 2018-10-23: qty 10

## 2018-10-23 MED ORDER — ONDANSETRON HCL 4 MG/2ML IJ SOLN: 4.0000 mg | Freq: Four times a day (QID) | INTRAMUSCULAR | Status: DC | PRN

## 2018-10-23 MED ORDER — OXYTOCIN 40 UNITS IN NORMAL SALINE INFUSION - SIMPLE MED
2.5000 [IU]/h | INTRAVENOUS | Status: DC
  Administered 2018-10-24: 09:00:00 via INTRAVENOUS

## 2018-10-23 MED ORDER — LACTATED RINGERS IV SOLN: 500.0000 mL | Freq: Once | INTRAVENOUS | Status: DC

## 2018-10-23 MED ORDER — FENTANYL-BUPIVACAINE-NACL 0.5-0.125-0.9 MG/250ML-% EP SOLN: 12.0000 mL/h | EPIDURAL | Status: DC | PRN

## 2018-10-23 MED ORDER — OXYTOCIN 40 UNITS IN NORMAL SALINE INFUSION - SIMPLE MED
1.0000 m[IU]/min | INTRAVENOUS | Status: DC
  Administered 2018-10-23: 14:00:00 4 m[IU]/min via INTRAVENOUS
  Filled 2018-10-23: qty 1000

## 2018-10-23 MED ORDER — LIDOCAINE HCL (PF) 1 % IJ SOLN
30.0000 mL | INTRAMUSCULAR | Status: DC | PRN
  Filled 2018-10-23: qty 30

## 2018-10-23 MED ORDER — FENTANYL 2.5 MCG/ML W/ROPIVACAINE 0.15% IN NS 100 ML EPIDURAL (ARMC)
EPIDURAL | Status: AC
  Filled 2018-10-23: qty 100

## 2018-10-23 MED ORDER — LACTATED RINGERS IV SOLN
INTRAVENOUS | Status: DC
  Administered 2018-10-23: 12:00:00 via INTRAVENOUS

## 2018-10-23 MED ORDER — SOD CITRATE-CITRIC ACID 500-334 MG/5ML PO SOLN: 30.0000 mL | ORAL | Status: DC | PRN

## 2018-10-23 MED ORDER — PENICILLIN G 3 MILLION UNITS IVPB - SIMPLE MED
3.0000 10*6.[IU] | INTRAVENOUS | Status: DC
  Administered 2018-10-23 (×2): 3 10*6.[IU] via INTRAVENOUS
  Filled 2018-10-23: qty 3
  Filled 2018-10-23 (×5): qty 100
  Filled 2018-10-23: qty 3

## 2018-10-23 MED ORDER — LACTATED RINGERS IV SOLN: 500.0000 mL | INTRAVENOUS | Status: DC | PRN

## 2018-10-23 MED ORDER — AMMONIA AROMATIC IN INHA
RESPIRATORY_TRACT | Status: AC
  Filled 2018-10-23: qty 10

## 2018-10-23 MED ORDER — OXYTOCIN 10 UNIT/ML IJ SOLN
INTRAMUSCULAR | Status: AC
  Filled 2018-10-23: qty 2

## 2018-10-23 MED ORDER — EPHEDRINE 5 MG/ML INJ
10.0000 mg | INTRAVENOUS | Status: DC | PRN
  Filled 2018-10-23: qty 2

## 2018-10-23 MED ORDER — ACETAMINOPHEN 500 MG PO TABS
1000.0000 mg | ORAL_TABLET | Freq: Four times a day (QID) | ORAL | Status: DC | PRN
  Administered 2018-10-23: 23:00:00 1000 mg via ORAL
  Filled 2018-10-23: qty 2

## 2018-10-23 MED ORDER — LIDOCAINE HCL (PF) 1 % IJ SOLN
INTRAMUSCULAR | Status: DC | PRN
  Administered 2018-10-23: 3 mL via INTRADERMAL

## 2018-10-23 MED ORDER — FENTANYL 2.5 MCG/ML W/ROPIVACAINE 0.15% IN NS 100 ML EPIDURAL (ARMC)
EPIDURAL | Status: DC | PRN
  Administered 2018-10-23: 12 mL/h via EPIDURAL

## 2018-10-23 MED ORDER — MISOPROSTOL 200 MCG PO TABS
ORAL_TABLET | ORAL | Status: AC
  Filled 2018-10-23: qty 4

## 2018-10-23 MED ORDER — LIDOCAINE-EPINEPHRINE (PF) 1.5 %-1:200000 IJ SOLN
INTRAMUSCULAR | Status: DC | PRN
  Administered 2018-10-23: 4 mL via PERINEURAL

## 2018-10-23 MED ORDER — SODIUM CHLORIDE 0.9 % IV SOLN
5.0000 10*6.[IU] | Freq: Once | INTRAVENOUS | Status: AC
  Administered 2018-10-23: 12:00:00 5 10*6.[IU] via INTRAVENOUS
  Filled 2018-10-23: qty 5

## 2018-10-23 MED ORDER — TERBUTALINE SULFATE 1 MG/ML IJ SOLN: 0.2500 mg | Freq: Once | INTRAMUSCULAR | Status: DC | PRN

## 2018-10-23 MED ORDER — DIPHENHYDRAMINE HCL 50 MG/ML IJ SOLN: 12.5000 mg | INTRAMUSCULAR | Status: DC | PRN

## 2018-10-23 NOTE — Anesthesia Preprocedure Evaluation (Signed)
Anesthesia Evaluation  Patient identified by MRN, date of birth, ID band Patient awake    Reviewed: Allergy & Precautions, H&P , NPO status , Patient's Chart, lab work & pertinent test results, reviewed documented beta blocker date and time   Airway Mallampati: II  TM Distance: >3 FB Neck ROM: full    Dental no notable dental hx. (+) Teeth Intact   Pulmonary neg pulmonary ROS, Current Smoker, former smoker,    Pulmonary exam normal breath sounds clear to auscultation       Cardiovascular Exercise Tolerance: Good negative cardio ROS   Rhythm:regular Rate:Normal     Neuro/Psych  Headaches, PSYCHIATRIC DISORDERS Anxiety Depression    GI/Hepatic negative GI ROS, Neg liver ROS,   Endo/Other  negative endocrine ROSdiabetes  Renal/GU      Musculoskeletal   Abdominal   Peds  Hematology negative hematology ROS (+)   Anesthesia Other Findings   Reproductive/Obstetrics (+) Pregnancy                             Anesthesia Physical Anesthesia Plan  ASA: II  Anesthesia Plan: Epidural   Post-op Pain Management:    Induction:   PONV Risk Score and Plan:   Airway Management Planned:   Additional Equipment:   Intra-op Plan:   Post-operative Plan:   Informed Consent: I have reviewed the patients History and Physical, chart, labs and discussed the procedure including the risks, benefits and alternatives for the proposed anesthesia with the patient or authorized representative who has indicated his/her understanding and acceptance.       Plan Discussed with:   Anesthesia Plan Comments:         Anesthesia Quick Evaluation

## 2018-10-23 NOTE — Progress Notes (Signed)
Intrapartum progress note  Patient comfortable, feeling contractions but not that strong  VS see flow sheet  SVE: 5/80/-2, AROM'd for clear fluid. Toco: q2-2min FHT: 130 mod + accels no decels  A/P: 32yo [id number] @ 39+0 with elective IOL  1. IUP: category 1 2. IOL: pitocin , AROM 3. GBS: PCN  4. Anticipate vaginal delivery  ----- Larey Days, MD, [place] Attending Obstetrician and Gynecologist [facility], Department of [facility]

## 2018-10-23 NOTE — Anesthesia Procedure Notes (Signed)
Epidural Patient location during procedure: OB  Staffing Performed: anesthesiologist   Preanesthetic Checklist Completed: patient identified, site marked, surgical consent, pre-op evaluation, timeout performed, IV checked, risks and benefits discussed and monitors and equipment checked  Epidural Patient position: sitting Prep: Betadine Patient monitoring: heart rate, continuous pulse ox and blood pressure Approach: midline Location: L4-L5 Injection technique: LOR saline  Needle:  Needle type: Tuohy  Needle gauge: 17 G Needle length: 9 cm and 9 Needle insertion depth: 6 cm Catheter type: closed end flexible Catheter size: 19 Gauge Catheter at skin depth: 12 cm Test dose: negative and 1.5% lidocaine with Epi 1:200 K  Assessment Sensory level: T10 Events: blood not aspirated, injection not painful, no injection resistance, negative IV test and no paresthesia  Additional Notes   Patient tolerated the insertion well without complications.-SATD -IVTD. No paresthesia. Refer to [place] for VS and dosingReason for block:procedure for pain

## 2018-10-23 NOTE — H&P (Signed)
OB History & Physical   History of Present Illness:  Chief Complaint:   HPI:  Carla Garrett is a 33 y.o. [phone] female at [redacted]w[redacted]d dated by Patient's last menstrual period was 01/26/2018 (approximate). Estimated Date of Delivery: 10/30/18  She presents to L&D for elective IOL due to COVID pandemic.  +FM, no CTX, no LOF, no VB  Pregnancy Issues: 1. PTSD, anxiety depression - in therapy, on zoloft 2. Rh neg, s/p rhogam x2 this pregnancy 3. 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 Daily smoker, 3-4 per day 5. GBS postiive  Maternal Medical History:   Past Medical History:  Diagnosis Date  . Back pain   . Chlamydia   . Depression   . Eczema   . Headache   . Hemorrhoids   . Obesity (BMI 30-39.9)   . Post traumatic stress disorder (PTSD)     Past Surgical History:  Procedure Laterality Date  . COLONOSCOPY WITH PROPOFOL N/A 05/19/2015   Procedure: COLONOSCOPY WITH PROPOFOL;  Surgeon: Hulen Luster, MD;  Location[facility] ENDOSCOPY;  Service: Gastroenterology;  Laterality: N/A;  . DILATION AND CURETTAGE, DIAGNOSTIC / THERAPEUTIC    . EYE SURGERY    . TONSILLECTOMY      No Known Allergies  Prior to Admission medications   Medication Sig Start Date End Date Taking? Authorizing Provider  cetirizine (ZYRTEC) 10 MG tablet Take 10 mg by mouth daily.   Yes [provider]  clonazePAM (KLONOPIN) 1 MG tablet Take 1 mg by mouth as needed for anxiety.   Yes [provider]  sertraline (ZOLOFT) 100 MG tablet Take 200 mg by mouth daily.   Yes [provider]  fluticasone (FLONASE) 50 MCG/ACT nasal spray Place 2 sprays into both nostrils daily. 03/25/17 04/08/17  Duffy Bruce, MD  levonorgestrel (MIRENA) 20 MCG/24HR IUD 1 each by Intrauterine route once.    [provider]  meclizine (ANTIVERT) 25 MG tablet Take 1 tablet (25 mg total) by mouth 3 (three) times daily as needed for dizziness. Patient not taking: Reported on 08/20/2018 03/25/17   Duffy Bruce, MD  saccharomyces boulardii (FLORASTOR) 250 MG capsule Take 250 mg by mouth 2 (two) times daily.    [provider]     Prenatal care site: [place] History: She  reports that she has quit smoking. She has never used smokeless tobacco. She reports that she does not drink alcohol or use drugs.  Family History:  Alcohol abuse, diabetes, breast cancer, OA, HTN  Review of Systems: A full review of systems was performed and negative except as noted in the HPI.     Physical Exam:  Vital Signs: BP 128/72 (BP Location: Left Arm)   Pulse 97   Temp 98.5 F (36.9 C) (Oral)   Resp 18   Ht 5\' 2"  (1.575 m)   Wt 95.3 kg   LMP 01/26/2018 (Approximate)   BMI 38.41 kg/m  General: no acute distress.  HEENT: normocephalic, atraumatic Heart: regular rate & rhythm.  No murmurs/rubs/gallops Lungs: clear to auscultation bilaterally, normal respiratory effort Abdomen: soft, gravid, non-tender;  EFW: 7.9 Pelvic:   External: Normal external female genitalia  Cervix:4/70/-2 posterior, soft    Extremities: non-tender, symmetric, 1+ edema bilaterally.  DTRs: 2+  Neurologic: Alert & oriented x 3.    No results found for this or any previous visit (from the past 24 hour(s)).  Pertinent Results:  Prenatal Labs: Blood type/Rh O neg  Antibody screen  neg  Rubella Immune  Varicella Immune  RPR NR  HBsAg Neg  HIV NR  GC neg  Chlamydia neg  Genetic screening declined  1 hour GTT 138  3 hour GTT WNL  GBS positive   FHT: 150 mod + accels no decels TOCO: none    Cephalic by leopolds  Assessment:  Carla Garrett is a 33 y.o. G53P2012 female at [redacted]w[redacted]d with elective IOL.   Plan:  1. Admit to Labor & Delivery 2. CBC, T&S, Clrs, IVF 3. GBS pos - start PCN  4. Consents obtained. 5. Continuous efm/toco 6. Category 1 tracing 7. IOL: pt is 4cm, start pitocin after 2 hours of PCN. Will AROM after adequate treatment.  ----- Larey Days, MD Attending  Obstetrician and Gynecologist [facility], Department of [facility]

## 2018-10-23 NOTE — Progress Notes (Signed)
Intrapartum progress note.  Notified by phone of recurrent decelerations and discontinuation of pitocin.  BP (!) 113/53 (BP Location: Left Arm)   Pulse 72   Temp 98.3 F (36.8 C) (Oral)   Resp 18   Ht 5\' 2"  (1.575 m)   Wt 95.3 kg   LMP 01/26/2018 (Approximate)   SpO2 100%   BMI 38.41 kg/m   Reviewed strip.  Baseline 140 no accels, undulating +1-2 minute variables to nadir of 90,  After stopping pitocin, baseline 150 with minimal variability. SVE by RN 7.5/100/0   33 yo [id number] @ 39+0 with elective induction  -Fluid bolus -Keep pitocin off for at least 30 minutes, likely longer for pitocin rest and improvement of O2 and uterine tone. -still anticipate vaginal delivery -close observation  ----- Larey Days, MD, [place] Attending Obstetrician and Gynecologist [facility], Department of [facility]

## 2018-10-24 LAB — CBC
HCT: 29.5 % — ABNORMAL LOW (ref 36.0–46.0)
HCT: 26.7 % — ABNORMAL LOW (ref 36.0–46.0)
Hemoglobin: 9.4 g/dL — ABNORMAL LOW (ref 12.0–15.0)
Hemoglobin: 8.7 g/dL — ABNORMAL LOW (ref 12.0–15.0)
MCH: 28.6 pg (ref 26.0–34.0)
MCH: 28.8 pg (ref 26.0–34.0)
MCHC: 31.9 g/dL (ref 30.0–36.0)
MCHC: 32.6 g/dL (ref 30.0–36.0)
MCV: 89.7 fL (ref 80.0–100.0)
MCV: 88.4 fL (ref 80.0–100.0)
Platelets: 202 10*3/uL (ref 150–400)
Platelets: 209 10*3/uL (ref 150–400)
RBC: 3.29 MIL/uL — ABNORMAL LOW (ref 3.87–5.11)
RBC: 3.02 MIL/uL — ABNORMAL LOW (ref 3.87–5.11)
RDW: 13.9 % (ref 11.5–15.5)
RDW: 14.1 % (ref 11.5–15.5)
WBC: 13.9 10*3/uL — ABNORMAL HIGH (ref 4.0–10.5)
WBC: 14.3 10*3/uL — ABNORMAL HIGH (ref 4.0–10.5)
nRBC: 0 % (ref 0.0–0.2)
nRBC: 0 % (ref 0.0–0.2)

## 2018-10-24 LAB — RPR: RPR Ser Ql: NONREACTIVE

## 2018-10-24 MED ORDER — ONDANSETRON HCL 4 MG PO TABS: 4.0000 mg | ORAL_TABLET | ORAL | Status: DC | PRN

## 2018-10-24 MED ORDER — DOCUSATE SODIUM 100 MG PO CAPS
100.0000 mg | ORAL_CAPSULE | Freq: Two times a day (BID) | ORAL | Status: DC
  Administered 2018-10-24 – 2018-10-25 (×2): 100 mg via ORAL
  Filled 2018-10-24 (×3): qty 1

## 2018-10-24 MED ORDER — WITCH HAZEL-GLYCERIN EX PADS: 1.0000 "application " | MEDICATED_PAD | CUTANEOUS | Status: DC

## 2018-10-24 MED ORDER — VITAMIN C 500 MG PO TABS
250.0000 mg | ORAL_TABLET | Freq: Two times a day (BID) | ORAL | Status: DC
  Administered 2018-10-24 – 2018-10-25 (×3): 250 mg via ORAL
  Filled 2018-10-24 (×3): qty 1

## 2018-10-24 MED ORDER — SIMETHICONE 80 MG PO CHEW: 80.0000 mg | CHEWABLE_TABLET | ORAL | Status: DC | PRN

## 2018-10-24 MED ORDER — OXYTOCIN 40 UNITS IN NORMAL SALINE INFUSION - SIMPLE MED
INTRAVENOUS | Status: AC
  Administered 2018-10-24: 09:00:00 via INTRAVENOUS
  Filled 2018-10-24: qty 1000

## 2018-10-24 MED ORDER — IBUPROFEN 600 MG PO TABS
600.0000 mg | ORAL_TABLET | Freq: Four times a day (QID) | ORAL | Status: DC
  Administered 2018-10-24 – 2018-10-25 (×5): 600 mg via ORAL
  Filled 2018-10-24 (×5): qty 1

## 2018-10-24 MED ORDER — CEFAZOLIN SODIUM-DEXTROSE 2-4 GM/100ML-% IV SOLN
2.0000 g | Freq: Once | INTRAVENOUS | Status: AC
  Administered 2018-10-24: 2 g via INTRAVENOUS
  Filled 2018-10-24: qty 100

## 2018-10-24 MED ORDER — PRENATAL MULTIVITAMIN CH
1.0000 | ORAL_TABLET | Freq: Every day | ORAL | Status: DC
  Administered 2018-10-24 – 2018-10-25 (×2): 1 via ORAL
  Filled 2018-10-24 (×2): qty 1

## 2018-10-24 MED ORDER — DIPHENHYDRAMINE HCL 25 MG PO CAPS: 25.0000 mg | ORAL_CAPSULE | Freq: Four times a day (QID) | ORAL | Status: DC | PRN

## 2018-10-24 MED ORDER — ACETAMINOPHEN 500 MG PO TABS
1000.0000 mg | ORAL_TABLET | Freq: Four times a day (QID) | ORAL | Status: DC | PRN
  Administered 2018-10-24 – 2018-10-25 (×3): 1000 mg via ORAL
  Filled 2018-10-24 (×3): qty 2

## 2018-10-24 MED ORDER — ONDANSETRON HCL 4 MG/2ML IJ SOLN: 4.0000 mg | INTRAMUSCULAR | Status: DC | PRN

## 2018-10-24 MED ORDER — METHYLERGONOVINE MALEATE 0.2 MG PO TABS
0.2000 mg | ORAL_TABLET | Freq: Four times a day (QID) | ORAL | Status: AC
  Administered 2018-10-24 – 2018-10-25 (×4): 0.2 mg via ORAL
  Filled 2018-10-24 (×4): qty 1

## 2018-10-24 MED ORDER — FERROUS SULFATE 325 (65 FE) MG PO TABS
325.0000 mg | ORAL_TABLET | Freq: Two times a day (BID) | ORAL | Status: DC
  Administered 2018-10-24 – 2018-10-25 (×3): 325 mg via ORAL
  Filled 2018-10-24 (×3): qty 1

## 2018-10-24 MED ORDER — HYDROMORPHONE HCL 1 MG/ML IJ SOLN
0.1000 mg | Freq: Once | INTRAMUSCULAR | Status: AC
  Administered 2018-10-24: 08:00:00 0.1 mg via INTRAVENOUS
  Filled 2018-10-24: qty 1

## 2018-10-24 MED ORDER — METHYLERGONOVINE MALEATE 0.2 MG/ML IJ SOLN
0.2000 mg | Freq: Once | INTRAMUSCULAR | Status: AC
  Administered 2018-10-24: 08:00:00 0.2 mg via INTRAMUSCULAR

## 2018-10-24 MED ORDER — SERTRALINE HCL 100 MG PO TABS
200.0000 mg | ORAL_TABLET | Freq: Every day | ORAL | Status: DC
  Administered 2018-10-24 – 2018-10-25 (×2): 200 mg via ORAL
  Filled 2018-10-24 (×2): qty 2

## 2018-10-24 MED ORDER — COCONUT OIL OIL: 1.0000 "application " | TOPICAL_OIL | Status: DC | PRN

## 2018-10-24 MED ORDER — DIBUCAINE (PERIANAL) 1 % EX OINT: 1.0000 "application " | TOPICAL_OINTMENT | CUTANEOUS | Status: DC | PRN

## 2018-10-24 MED ORDER — IBUPROFEN 600 MG PO TABS
600.0000 mg | ORAL_TABLET | Freq: Four times a day (QID) | ORAL | Status: DC
  Administered 2018-10-24: 06:00:00 600 mg via ORAL
  Filled 2018-10-24: qty 1

## 2018-10-24 MED ORDER — METHYLERGONOVINE MALEATE 0.2 MG PO TABS
0.2000 mg | ORAL_TABLET | Freq: Once | ORAL | Status: AC
  Administered 2018-10-24: 06:00:00 0.2 mg via ORAL

## 2018-10-24 MED ORDER — BENZOCAINE-MENTHOL 20-0.5 % EX AERO: 1.0000 "application " | INHALATION_SPRAY | CUTANEOUS | Status: DC | PRN

## 2018-10-24 MED ORDER — METHYLERGONOVINE MALEATE 0.2 MG/ML IJ SOLN
INTRAMUSCULAR | Status: AC
  Administered 2018-10-24: 0.2 mg via INTRAMUSCULAR
  Filled 2018-10-24: qty 1

## 2018-10-24 NOTE — Lactation Note (Signed)
This note was copied from a baby's chart. Lactation Consultation Note  Patient Name: Carla Garrett Today's Date: 10/24/2018     Maternal Data    Feeding    LATCH Score                   Interventions    Lactation Tools Discussed/Used     Consult Status  LC asked MOB to put baby skin to skin for about 22mins and watch for feeding cues. Reviewed feeding cues w/ MOB and she states that she is comfortable putting baby skin to skin independently. MOB has successfully breastfeed prior 2 children who are now 66 and 5yo.     Marnee Spring 10/24/2018, 9:43 AM

## 2018-10-24 NOTE — Lactation Note (Signed)
This note was copied from a baby's chart. Lactation Consultation Note  Patient Name: Carla Garrett [id number] Date: 10/24/2018 Reason for consult: Follow-up assessment   Maternal Data Has patient been taught Hand Expression?: Yes  Feeding Feeding Type: Breast Fed  LATCH Score                   Interventions    Lactation Tools Discussed/Used     Consult Status Consult Status: Follow-up  LC entered room as MOB was finishing feeding. MOB states that feeding went very well and that baby has voided and she is about to change diaper.  MOB states that she doesn't have any breastfeeding questions/concerns and that she is comfortable independently nursing baby. She also states that baby sounds like she will spit up amniotic fluid soon so she's keeping her in an upright position for now.   Marnee Spring 10/24/2018, 1:24 PM

## 2018-10-24 NOTE — Progress Notes (Signed)
Dr. Leonides Schanz at bedside to perform ultrasound and manual extraction of clots.

## 2018-10-25 DIAGNOSIS — D62 Acute posthemorrhagic anemia: Secondary | ICD-10-CM | POA: Diagnosis not present

## 2018-10-25 LAB — CBC
HCT: 25.3 % — ABNORMAL LOW (ref 36.0–46.0)
Hemoglobin: 8 g/dL — ABNORMAL LOW (ref 12.0–15.0)
MCH: 28.6 pg (ref 26.0–34.0)
MCHC: 31.6 g/dL (ref 30.0–36.0)
MCV: 90.4 fL (ref 80.0–100.0)
Platelets: 192 10*3/uL (ref 150–400)
RBC: 2.8 MIL/uL — ABNORMAL LOW (ref 3.87–5.11)
RDW: 14.1 % (ref 11.5–15.5)
WBC: 12.8 10*3/uL — ABNORMAL HIGH (ref 4.0–10.5)
nRBC: 0 % (ref 0.0–0.2)

## 2018-10-25 LAB — FETAL SCREEN: Fetal Screen: NEGATIVE

## 2018-10-25 MED ORDER — PRENATAL VITAMIN 27-0.8 MG PO TABS: 1.0000 | ORAL_TABLET | Freq: Every day | ORAL | 3 refills | Status: AC

## 2018-10-25 MED ORDER — ASCORBIC ACID 250 MG PO TABS: 250.0000 mg | ORAL_TABLET | Freq: Two times a day (BID) | ORAL | 1 refills | Status: AC

## 2018-10-25 MED ORDER — SENNOSIDES-DOCUSATE SODIUM 8.6-50 MG PO TABS: 2.0000 | ORAL_TABLET | Freq: Every evening | ORAL | 0 refills | Status: AC | PRN

## 2018-10-25 MED ORDER — SENNOSIDES-DOCUSATE SODIUM 8.6-50 MG PO TABS: 2.0000 | ORAL_TABLET | Freq: Every day | ORAL | Status: DC

## 2018-10-25 MED ORDER — FERROUS SULFATE 325 (65 FE) MG PO TABS: 325.0000 mg | ORAL_TABLET | Freq: Two times a day (BID) | ORAL | 1 refills | Status: AC

## 2018-10-25 MED ORDER — ACETAMINOPHEN 500 MG PO TABS: 1000.0000 mg | ORAL_TABLET | Freq: Four times a day (QID) | ORAL | 0 refills | Status: AC | PRN

## 2018-10-25 MED ORDER — IBUPROFEN 600 MG PO TABS: 600.0000 mg | ORAL_TABLET | Freq: Four times a day (QID) | ORAL | 0 refills | Status: AC | PRN

## 2018-10-25 MED ORDER — RHO D IMMUNE GLOBULIN 1500 UNIT/2ML IJ SOSY
300.0000 ug | PREFILLED_SYRINGE | Freq: Once | INTRAMUSCULAR | Status: AC
  Administered 2018-10-25: 300 ug via INTRAVENOUS
  Filled 2018-10-25: qty 2

## 2018-10-25 MED ORDER — ONDANSETRON 4 MG PO TBDP: 4.0000 mg | ORAL_TABLET | Freq: Three times a day (TID) | ORAL | 1 refills | Status: AC | PRN

## 2018-10-25 NOTE — Anesthesia Postprocedure Evaluation (Signed)
Anesthesia Post Note  Patient: Carla Garrett  Procedure(s) Performed: AN AD HOC LABOR EPIDURAL  Patient location during evaluation: Mother Baby Anesthesia Type: Epidural Level of consciousness: awake and alert Pain management: pain level controlled Vital Signs Assessment: post-procedure vital signs reviewed and stable Respiratory status: spontaneous breathing, nonlabored ventilation and respiratory function stable Cardiovascular status: stable Postop Assessment: no headache and epidural receding Anesthetic complications: no     Last Vitals:  Vitals:   10/24/18 2317 10/25/18 0809  BP: (!) 108/56 109/74  Pulse: (!) 58 60  Resp: 16 16  Temp: 37.1 C 36.9 C  SpO2: 100% 100%    Last Pain:  Vitals:   10/25/18 0809  TempSrc: Oral  PainSc:                  Estill Batten

## 2018-10-25 NOTE — Discharge Summary (Addendum)
Obstetric Discharge Summary   Patient Name: Carla Garrett DOB: [birth year] MRN: [id number]  Date of Admission: 10/23/2018 Date of Delivery: 10/24/2018 Delivered by: Dr. Vikki Ports Joneisha Miles Date of Discharge: 10/25/2018  Primary OB: [place]  TGY:BWLSLHT'D last menstrual period was 01/26/2018 (approximate). EDC Estimated Date of Delivery: 10/30/18 Gestational Age at Delivery: [redacted]w[redacted]d   Antepartum complications:  1.PTSD, anxiety, depression: in therapy, on Zoloft 2. Rh neg, s/p rhogam x2 this pregnancy 3. 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 Daily smoker, 3-4 per day 5. GBS positive  Admitting Diagnosis: elective induction of labor  Secondary Diagnoses: Patient Active Problem List   Diagnosis Date Noted  . Postpartum hemorrhage 10/25/2018  . Acute blood loss anemia 10/25/2018  . Labor and delivery indication for care or intervention 10/23/2018  . Decreased fetal movement affecting management of mother, antepartum 08/20/2018    Induction/Augmentation: AROM and Pitocin Complications: delayed postpartum hemorrhage at 4-5 hours PP Intrapartum complications/course: Mom presented to L&D with elective IOL, PCN given for GBS ppx. Started with pitocin, then AROM.  epidual placed. Category 2 tracing, and pitocin discontinued.  Eventually restarted, progressed to complete, second stage: 10 minutes. Perfectly timed with the amazing song "Circle of Life", delivery of fetal head with restitution toLOT.Tight nuchal encountered and unable to be reduced at perineum.  Anterior then posterior shoulders delivered without difficulty, cord reduced at that time.  Baby placed on mom's chest, and attended to by peds.  Cord was then clamped and cut by FOB after a >60 second delay.  Placenta spontaneously delivered, intact.   IV pitocin given for hemorrhage prophylaxis.  Delivery Type: spontaneous vaginal delivery Anesthesia: epidural Placenta: spontaneous Laceration: none Episiotomy:  none  Newborn Data: Live born female "Royal" Birth Weight: 6 lb 15.8 oz (3170 g) APGAR: 8, 9  Newborn Delivery   Birth date/time:  10/24/2018 01:28:00 Delivery type:  Vaginal, Spontaneous     Postpartum Course  Patient had a postpartum course complicated by hemorrhage around 4-5 hours postpartum. At that time, Dr. Leonides Schanz completed manual evacuation of blood clots within the uterus and cervix.Total EBL was 1900cc. She was given Methergine PO x 24 hours. By time of discharge on PPD#1, her pain was controlled on oral pain medications; she had appropriate lochia and was ambulating, voiding without difficulty and tolerating regular diet.  She was deemed stable for discharge to home.       Labs: CBC Latest Ref Rng & Units 10/25/2018 10/24/2018 10/24/2018  WBC 4.0 - 10.5 K/uL 12.8(H) 14.3(H) 13.9(H)  Hemoglobin 12.0 - 15.0 g/dL 8.0(L) 8.7(L) 9.4(L)  Hematocrit 36.0 - 46.0 % 25.3(L) 26.7(L) 29.5(L)  Platelets 150 - 400 K/uL 192 209 202   O NEG  Physical exam:  BP 109/74 (BP Location: Left Arm)   Pulse 60   Temp 98.4 F (36.9 C) (Oral)   Resp 16   Ht 5\' 2"  (1.575 m)   Wt 95.3 kg   LMP 01/26/2018 (Approximate)   SpO2 100%   Breastfeeding Unknown   BMI 38.41 kg/m  General: alert and no distress Pulm: normal respiratory effort Lochia: appropriate Abdomen: soft, NT Uterine Fundus: firm, below umbilicus Extremities: No evidence of DVT seen on physical exam. No lower extremity edema.   Disposition: stable, discharge to home Baby Feeding: breastmilk Baby Disposition: home with mom  Contraception: IUD  Prenatal Labs:  Blood type/Rh O neg  Antibody screen neg  Rubella Immune  Varicella Immune  RPR NR  HBsAg Neg  HIV NR  GC neg  Chlamydia neg  Genetic screening declined  1 hour GTT 138  3 hour GTT 69, 134, 107, 92  GBS positive    Rh Immune globulin given: yes, 364mcg  - baby A pos Rubella vaccine given: n/a Tdap vaccine given in AP or PP setting: AP 08/14/2018 Flu  vaccine given in AP or PP setting: AP given at work  Plan:  Carla Garrett was discharged to home in good condition. Follow-up appointment at [place] with delivery provider in 6 weeks.  Discharge Instructions: Per After Visit Summary. Activity: Advance as tolerated. Pelvic rest for 6 weeks.   Diet: Regular Discharge Medications: Allergies as of 10/25/2018   No Known Allergies     Medication List    STOP taking these medications   levonorgestrel 20 MCG/24HR IUD Commonly known as:  MIRENA   meclizine 25 MG tablet Commonly known as:  ANTIVERT     TAKE these medications   acetaminophen 500 MG tablet Commonly known as:  TYLENOL Take 2 tablets (1,000 mg total) by mouth every 6 (six) hours as needed for mild pain or moderate pain.   ascorbic acid 250 MG tablet Commonly known as:  VITAMIN C Take 1 tablet (250 mg total) by mouth 2 (two) times daily with a meal.   cetirizine 10 MG tablet Commonly known as:  ZYRTEC Take 10 mg by mouth daily.   clonazePAM 1 MG tablet Commonly known as:  KLONOPIN Take 1 mg by mouth as needed for anxiety.   ferrous sulfate 325 (65 FE) MG tablet Take 1 tablet (325 mg total) by mouth 2 (two) times daily with a meal.   fluticasone 50 MCG/ACT nasal spray Commonly known as:  FLONASE Place 2 sprays into both nostrils daily.   ibuprofen 600 MG tablet Commonly known as:  ADVIL Take 1 tablet (600 mg total) by mouth every 6 (six) hours as needed for mild pain, moderate pain or cramping.   ondansetron 4 MG disintegrating tablet Commonly known as:  ZOFRAN-ODT Take 1 tablet (4 mg total) by mouth every 8 (eight) hours as needed for nausea or vomiting.   Prenatal Vitamin 27-0.8 MG Tabs Take 1 tablet by mouth daily.   saccharomyces boulardii 250 MG capsule Commonly known as:  FLORASTOR Take 250 mg by mouth 2 (two) times daily.   senna-docusate 8.6-50 MG tablet Commonly known as:  Senokot-S Take 2 tablets by mouth at bedtime as needed  for mild constipation.   sertraline 100 MG tablet Commonly known as:  ZOLOFT Take 200 mg by mouth daily.      Outpatient follow up:  Follow-up Information    Mykael Trott, Honor Loh, MD Follow up in 6 week(s).   Specialty:  Obstetrics and Gynecology Contact information: [place] [address] [phone]            Signed:  Lisette Grinder, CNM 10/25/2018 4:38 PM

## 2018-10-25 NOTE — Lactation Note (Signed)
This note was copied from a baby's chart. Lactation Consultation Note  Patient Name: Carla Garrett Today's Date: 10/25/2018     Maternal Data    Feeding Feeding Type: Breast Fed  LATCH Score                   Interventions    Lactation Tools Discussed/Used     Consult Status  LC spoke with patient in detail about establishing milk supply and clusterfeeding as well as virtual support. Parents feel very good about breastfeeding independently and know where to go for support after d/c.    Marnee Spring 10/25/2018, 12:31 PM

## 2018-10-25 NOTE — Progress Notes (Addendum)
Called by RN around 6:30 with concern for volume of blood loss since transferring to postpartum floor, with passage of large clots.  Patient reported to have cramping but was hemodynamically stable.   At same time was in delivery on labor floor.  Was able to assess patient around 7:30am after reevaluating the placenta.  Placenta again appeared fully intact, with no missing cotyledons or membranes.   Patient had saturated pads beneath her and hand-sized clot protruding from vagina. Ultrasound showed large blood clot within uterus and no evidence of adherent tissue (no discreet echogenic areas).   Proceeded with manual evacuation of uterine clots.  Large clot volume within cervix, keeping it open and taut. No area of uterus palpated evidenced adherent tissue.   Anterior fibroid distorting cavity. 800cc of clots removed from uterus/cervix/vagina. Minimal bleeding afterward. Patient tolerated very well, considering.  Ancef 2g given Methergine IM given and ordered for q8h x 24h.  Repeat CBC at 2pm.  Continue to follow closely.  ----- Larey Days, MD, [place] Attending Obstetrician and Gynecologist [facility], Department of OB/GYN [facility]  While there may not be an accurate CPT code for this procedure, it was outside the context for vaginal delivery, and required separate mental and physical execution.  60 minutes were spent face to face performing this evaluation and evacuation.

## 2018-10-25 NOTE — Progress Notes (Signed)
Discharge instructions, prescriptions, education, and appointments given and explained. Pt verbalized understanding with no further questions. Pt transported to car in wheelchair per staff with all belongings.

## 2018-10-25 NOTE — Discharge Instructions (Signed)
After Your Delivery Discharge Instructions   Postpartum: Care Instructions  After childbirth (postpartum period), your body goes through many changes. Some of these changes happen over several weeks. In the hours after delivery, your body will begin to recover from childbirth while it prepares to breastfeed your newborn. You may feel emotional during this time. Your hormones can shift your mood without warning for no clear reason.  In the first couple of weeks after childbirth, many women have emotions that change from happy to sad. You may find it hard to sleep. You may cry a lot. This is called the "baby blues." These overwhelming emotions often go away within a couple of days or weeks. But it's important to discuss your feelings with your doctor.  You should call your care provider if you have unrelieved feelings of:  Inability to cope  Sadness  Anxiety  Lack of interest in baby  Insomnia  Crying  It is easy to get too tired and overwhelmed during the first weeks after childbirth. Don't try to do too much. Get rest whenever you can, accept help from others, and eat well and drink plenty of fluids.  About 4 to 6 weeks after your baby's birth, you will have a follow-up visit with your care provider. This visit is your time to talk to your provider about anything you are concerned or curious about.  Follow-up care is a key part of your treatment and safety. Be sure to make and go to all appointments, and call your doctor if you are having problems. It's also a good idea to know your test results and keep a list of the medicines you take.  How can you care for yourself at home?  Sleep or rest when your baby sleeps.  Get help with household chores from family or friends, if you can. Do not try to do it all yourself.  If you have hemorrhoids or swelling or pain around the opening of your vagina, try using cold and heat. You can put ice or a cold pack on the area for 10 to 20 minutes at  a time. Put a thin cloth between the ice and your skin. Also try sitting in a few inches of warm water (sitz bath) 3 times a day and after bowel movements.  Take pain medicines exactly as directed.  If the provider gave you a prescription medicine for pain, take it as prescribed.  If you do not have a prescription and need something over the counter, you can take:  Ibuprofen (Motrin, Advil) up to '600mg'$  every 6 hours as needed for pain  Acetaminophen (Tylenol) up to '650mg'$  every 4 hours as needed for pain  Some people find it helpful to alternate between these two medications.   No driving for 1-2 weeks or while taking pain medications.   Eat more fiber to avoid constipation. Include foods such as whole-grain breads and cereals, raw vegetables, raw and dried fruits, and beans.  Drink plenty of fluids, enough so that your urine is light yellow or clear like water. If you have kidney, heart, or liver disease and have to limit fluids, talk with your doctor before you increase the amount of fluids you drink.  Do not put anything in the vagina for 6 weeks. This means no sex, no tampons, no douching, and no enemas.  If you have stitches, keep the area clean by pouring or spraying warm water over the area outside your vagina and anus after you use the toilet.  No strenuous activity or heavy lifting for 6 weeks   No tub baths; showers only  Continue prenatal vitamin and iron.  If breastfeeding:  Increase calories and fluids while breastfeeding.  You may have a slight fever when your milk comes in, but it should go away on its own. If it does not, and rises above 101.0 please call the doctor.  For breastfeeding concerns, the lactation consultant can be reached at [phone].  For concerns about your baby, please call your pediatrician.   Keep a list of questions to bring to your postpartum visit. Your questions might be about:  Changes in your breasts, such as lumps or  soreness.  When to expect your menstrual period to start again.  What form of birth control is best for you.  Weight you have put on during the pregnancy.  Exercise options.  What foods and drinks are best for you, especially if you are breastfeeding.  Problems you might be having with breastfeeding.  When you can have sex. Some women may want to talk about lubricants for the vagina.  Any feelings of sadness or restlessness that you are having.   When should you call for help?  Call 911 anytime you think you may need emergency care. For example, call if:  You have thoughts of harming yourself, your baby, or another person.  You passed out (lost consciousness).  Call the office at [phone] or seek immediate medical care if:  If you have heavy bleeding such that you are soaking 1 pad in an hour for 2 hours  You are dizzy or lightheaded, or you feel like you may faint.  You have a fever; a temperature of 101.0 F or greater  Chills  Difficulty urinating  Headache unrelieved by "pain meds"   Visual changes  Pain in the right side of your belly near your ribs  Breasts reddened, hard, hot to the touch or any other breast concerns  Nipple discharge which is foul-smelling or contains pus   New pain unrelieved with recommended over-the-counter dosages  Difficulty breathing with or without chest pain   New leg pain, swelling, or redness, especially if it is only on one leg  Any other concerns  Watch closely for changes in your health, and be sure to contact your provider if:  You have new or worse vaginal discharge.  You feel sad or depressed.  You are having problems with your breasts or breastfeeding.    

## 2018-10-25 NOTE — Clinical Social Work Maternal (Signed)
  CLINICAL SOCIAL WORK MATERNAL/CHILD NOTE  Patient Details  Name: Carla Garrett MRN: [id number] Date of Birth: 1985/10/02  Date:  10/25/2018  Clinical Social Worker Initiating Note:  Annamaria Boots  Date/Time: Initiated:  10/25/18/1537     Child's Name:  Carla Garrett    Biological Parents:  Mother   Need for Interpreter:  None   Reason for Referral:      Address:  [address] [place] [address]    Phone number:  [phone] (home)     Additional phone number:  Household Members/Support Persons (HM/SP):   Household Member/Support Person 1   HM/SP Name Relationship DOB or Age  HM/SP -1 Reggie Rone  Significant other     HM/SP -2        HM/SP -3        HM/SP -4        HM/SP -5        HM/SP -6        HM/SP -7        HM/SP -8          Natural Supports (not living in the home):  Children   Professional Supports: None   Employment: Full-time   Type of Work: Therapist, sports    Education:  Forensic psychologist   Homebound arranged:    Museum/gallery curator Resources:  Self-Pay    Other Resources:      Cultural/Religious Considerations Which May Impact Care:    Strengths:  Ability to meet basic needs , Home prepared for child    Psychotropic Medications:         Pediatrician:       Pediatrician List:   [place]      Pediatrician Fax Number:    Risk Factors/Current Problems:  Mental Health Concerns    Cognitive State:  Alert    Mood/Affect:  Calm    CSW Assessment: CSW consulted for depression scale. CSW met with patient. Patient is an Freight forwarder. CSW explained reason for visit. Patient states that this is her third and last baby and her name is [name]. Patient reports that she has all necessary items for baby at home and is ready to return home. Patient reports no signs of depression/anxiety at this time. CSW explained signs of post partum depression and  what to look for. Patient reports that she has a good support system and knows what to look for for PPD. Patient has no other concerns at this time. CSW signing off. Please re consult if further needs arise.   CSW Plan/Description:  No Further Intervention Required/No Barriers to Discharge    Sissy Hoff 10/25/2018, 3:39 PM

## 2018-10-25 NOTE — Progress Notes (Signed)
Post Partum Day 1  Subjective: Doing well, no concerns. Ambulating without difficulty, pain managed with PO meds, tolerating regular diet, and voiding without difficulty.   No fever/chills, chest pain, shortness of breath, nausea/vomiting, or leg pain. No nipple or breast pain.   Objective: BP 109/74 (BP Location: Left Arm)   Pulse 60   Temp 98.4 F (36.9 C) (Oral)   Resp 16   Ht 5\' 2"  (1.575 m)   Wt 95.3 kg   LMP 01/26/2018 (Approximate)   SpO2 100%   BMI 38.41 kg/m    Physical Exam:  General: alert, cooperative, appears stated age and no distress Breasts: soft/nontender CV: RRR Pulm: nl effort, CTABL Abdomen: soft, non-tender, active bowel sounds Uterine Fundus: firm Lochia: appropriate DVT Evaluation: No evidence of DVT seen on physical exam. No cords or calf tenderness. No significant calf/ankle edema.  Recent Labs    10/24/18 1418 10/25/18 0415  HGB 8.7* 8.0*  HCT 26.7* 25.3*  WBC 14.3* 12.8*  PLT 209 192    Assessment/Plan: 33 y.o. [id number] postpartum day # 1  -Continue routine postpartum care -Lactation consult PRN for breastfeeding. -Desires IUD for contraception.  -Acute blood loss anemia - hemodynamically stable and asymptomatic; continue PO ferrous sulfate BID with stool softeners  -Immunization status: all immunizations up to date -Discussed dosing of Zoloft. Patient is back up to 200mg  PO daily and tolerating without any GI side effects. Plan to continue this dose. Will send prescription for Zofran ODT 4mg  PO q8h PRN in case of nausea.  -Baby is currently on lights for elevated bilirubin. If baby is cleared for discharge later today, plan discharge for patient. If not, plan discharge for patient tomorrow.   Disposition: Continue inpatient postpartum care   LOS: 2 days   Lisette Grinder, CNM 10/25/2018, 11:11 AM   ----- Lisette Grinder Certified Nurse Midwife [facility]

## 2018-10-26 LAB — RHOGAM INJECTION: Unit division: 0

## 2019-04-10 ENCOUNTER — Ambulatory Visit: Payer: MEDICAID

## 2019-04-19 IMAGING — US US OB TRANSVAGINAL
1 series · 14 of 28 positions shown · non-contrast
Comparison: None.

CLINICAL DATA: Pelvic pain and vaginal spotting.  Beta hCG [DATE].

EXAM:
OBSTETRIC <14 WK US AND TRANSVAGINAL OB US
TECHNIQUE: Both transabdominal and transvaginal ultrasound examinations were
performed for complete evaluation of the gestation as well as the
maternal uterus, adnexal regions, and pelvic cul-de-sac.
Transvaginal technique was performed to assess early pregnancy.

[Series 1: us ob transvaginal · 14 of 92 slices shown]
[im 4/92]
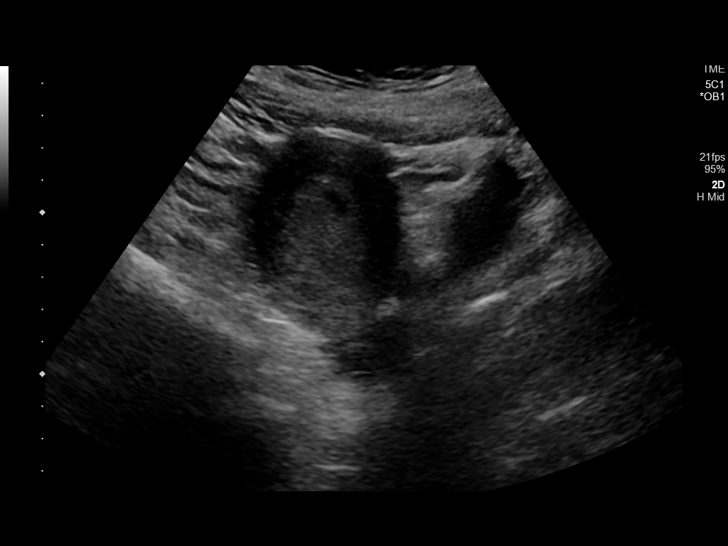
[im 11/92]
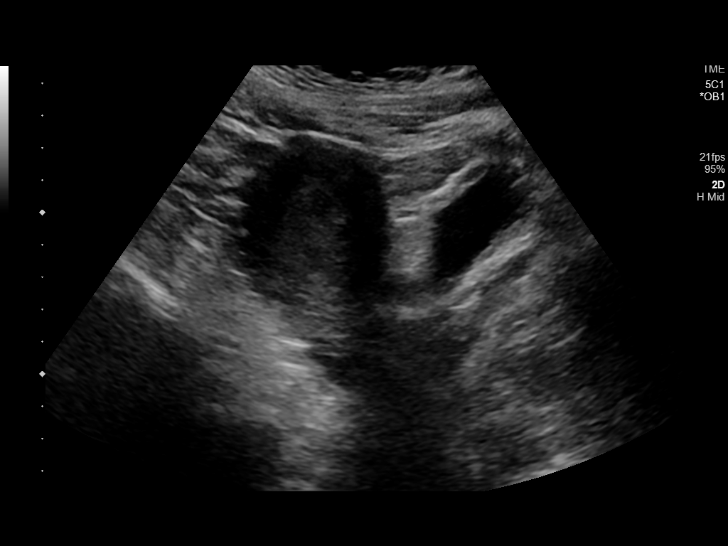
[im 17/92]
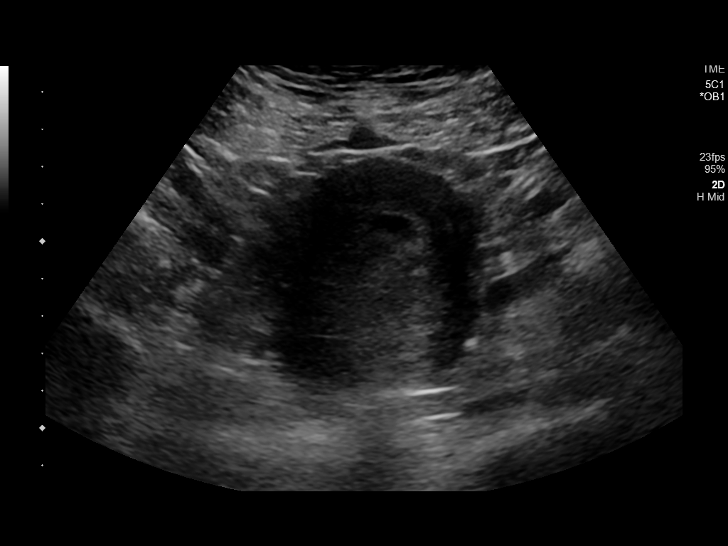
[im 24/92]
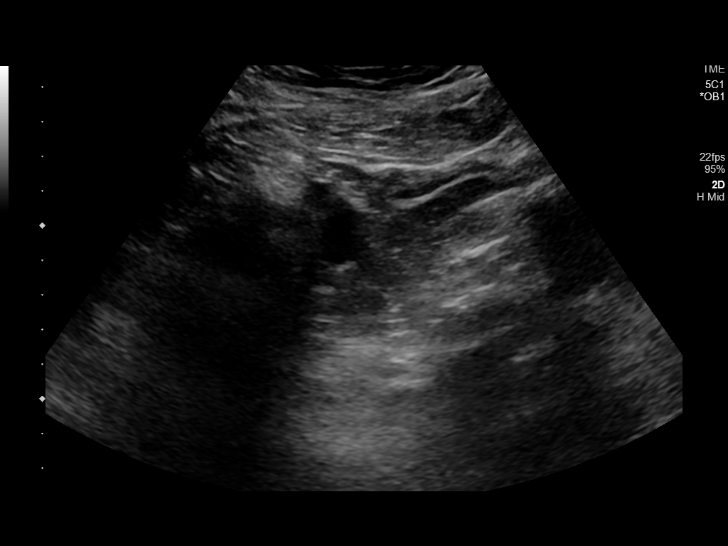
[im 31/92]
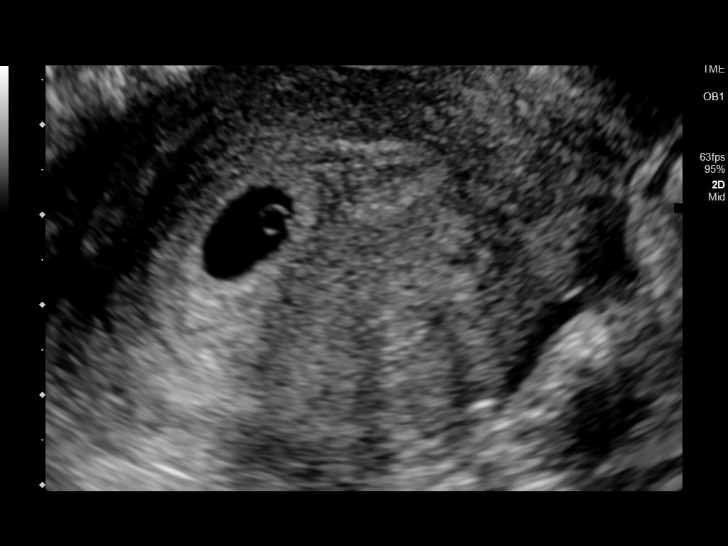
[im 38/92]
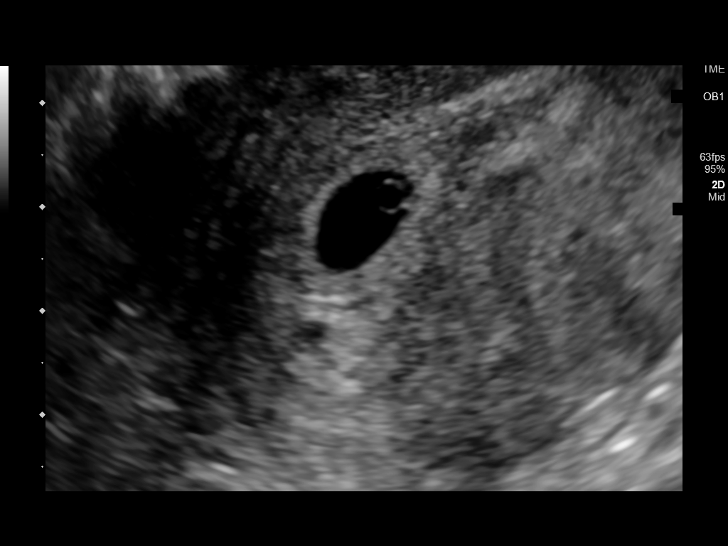
[im 44/92]
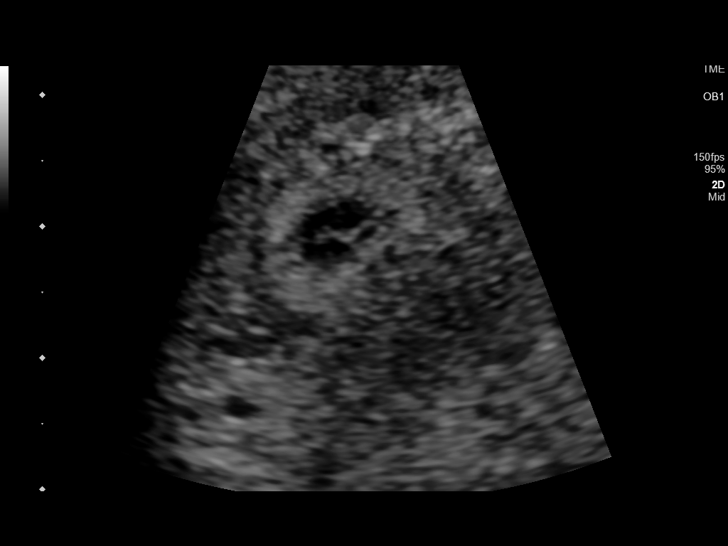
[im 51/92]
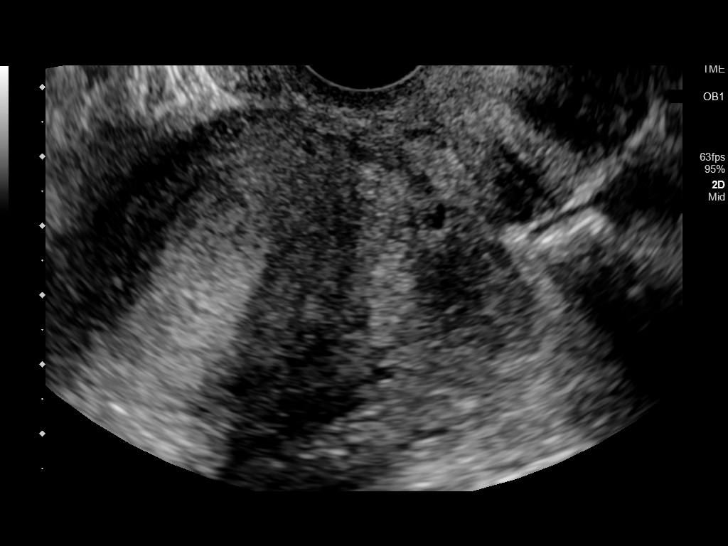
[im 58/92]
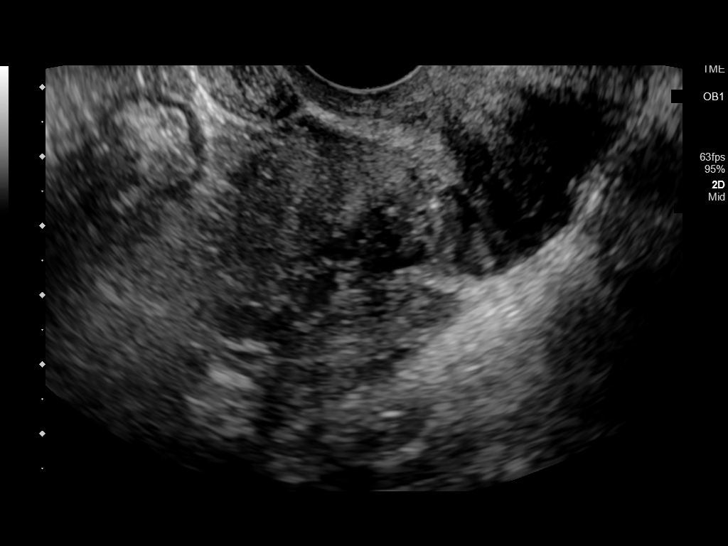
[im 65/92]
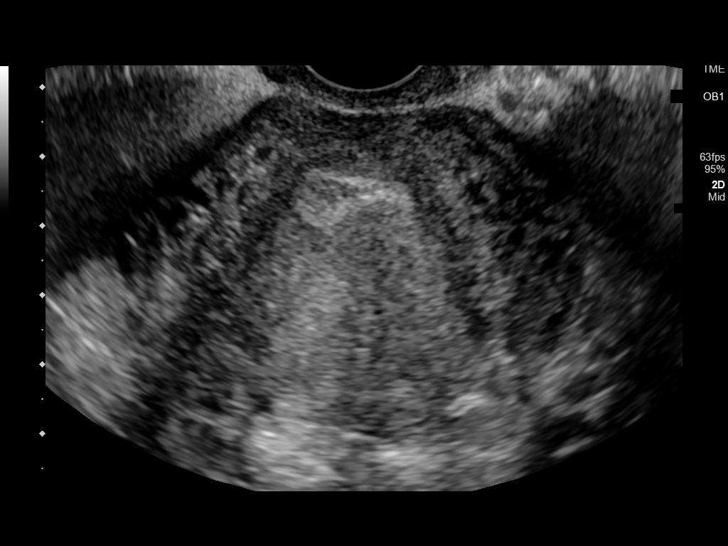
[im 71/92]
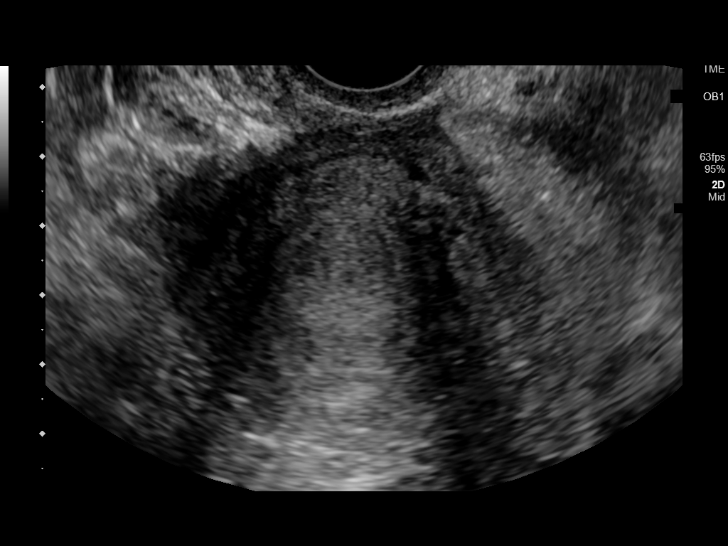
[im 78/92]
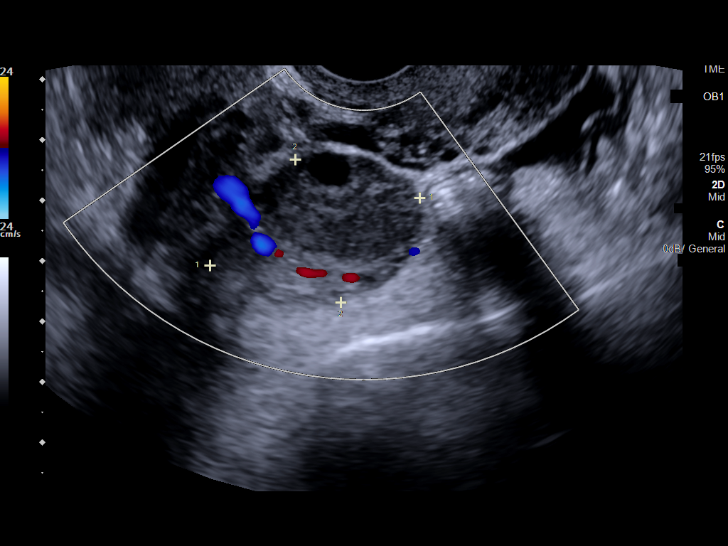
[im 85/92]
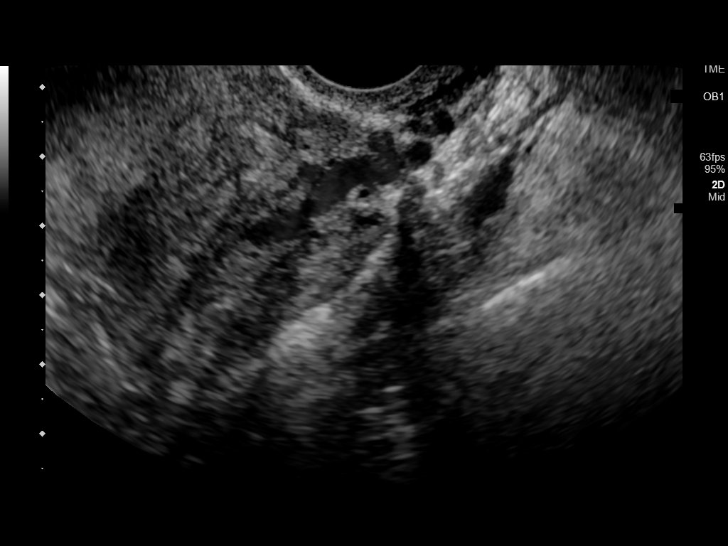
[im 92/92]
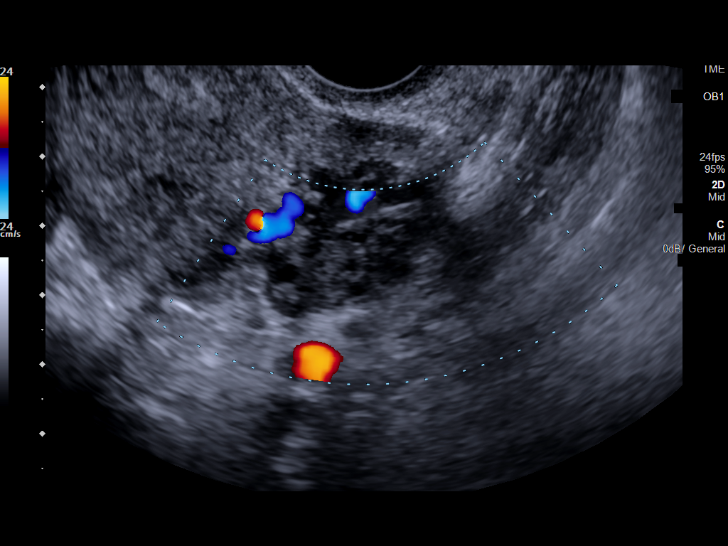

[14 of 28 positions shown; findings below may reference images not displayed]

FINDINGS: Intrauterine gestational sac: Present.

Yolk sac:  Present.

Embryo:  Present.

Cardiac Activity: Present.

Heart Rate: Not quantified due to small size.

CRL:  2.7 mm   5 w   6 d                  US EDC: October 30, 2018

Subchorionic hemorrhage:  None visualized.

Maternal uterus/adnexae: Normal appearance of the adnexa, RIGHT
corpus luteal cyst.
IMPRESSION: Single live intrauterine pregnancy, gestational age by ultrasound 5
weeks and 6 days. No immediate complication.

## 2019-06-03 ENCOUNTER — Inpatient Hospital Stay: Payer: Self-pay

## 2019-06-06 ENCOUNTER — Ambulatory Visit: Payer: MEDICAID

## 2019-06-12 ENCOUNTER — Encounter (HOSPITAL_BASED_OUTPATIENT_CLINIC_OR_DEPARTMENT_OTHER): Payer: Self-pay

## 2019-06-14 ENCOUNTER — Inpatient Hospital Stay: Payer: Self-pay

## 2019-09-05 IMAGING — US US OB TRANSVAGINAL
1 series · 14 of 28 positions shown · non-contrast
Comparison: 03/05/2018 pelvic ultrasound

CLINICAL DATA: 31 y/o F; 3 days of spotting with increased bleeding
today. Quantitative beta hCG [DATE].

EXAM:
OBSTETRIC <14 WK US AND TRANSVAGINAL OB US
TECHNIQUE: Both transabdominal and transvaginal ultrasound examinations were
performed for complete evaluation of the gestation as well as the
maternal uterus, adnexal regions, and pelvic cul-de-sac.
Transvaginal technique was performed to assess early pregnancy.

[Series 1: us ob transvaginal · 0.15mm/px · 14 of 81 slices shown]
[im 3/81]
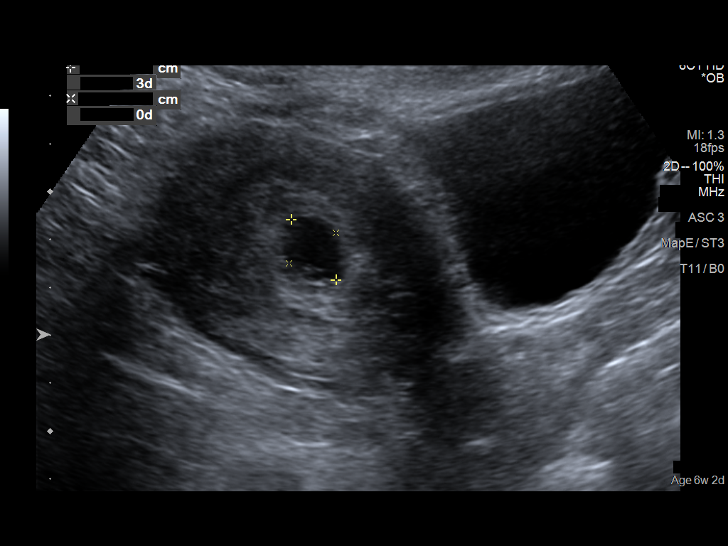
[im 9/81]
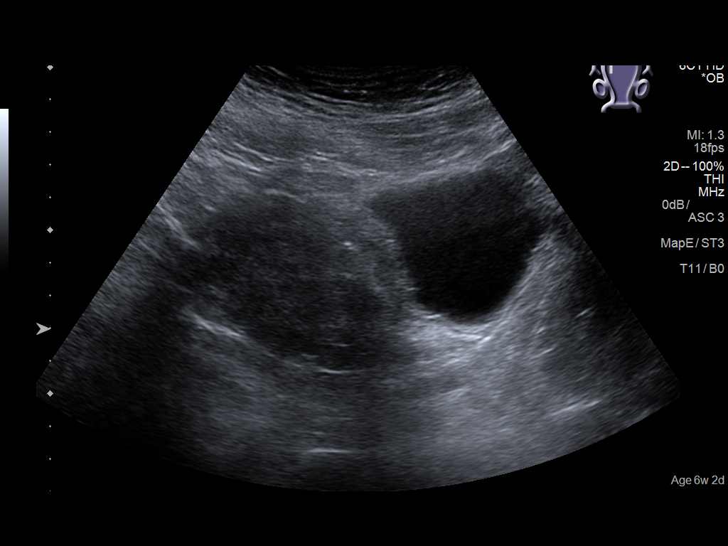
[im 15/81]
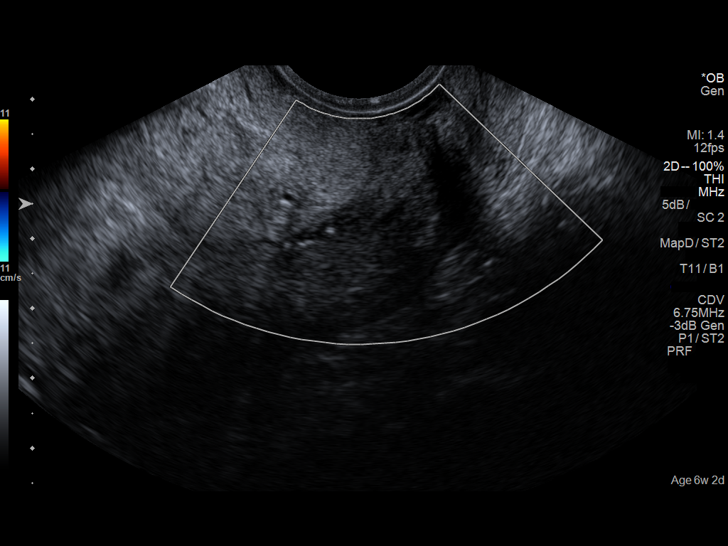
[im 21/81]
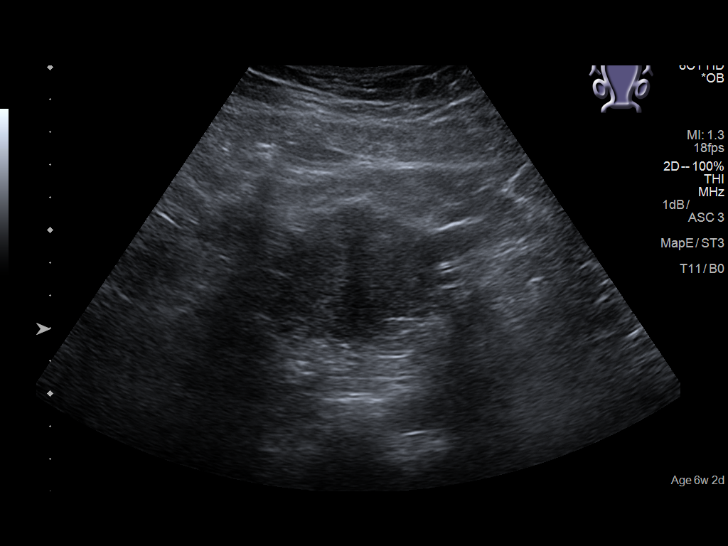
[im 27/81]
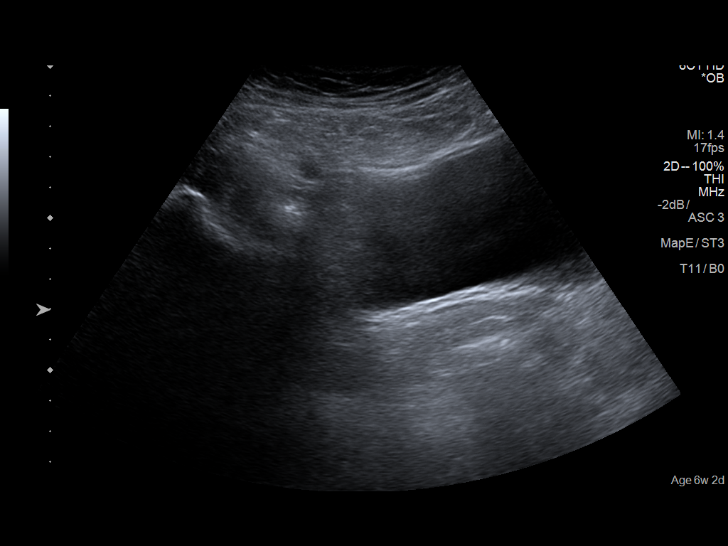
[im 33/81]
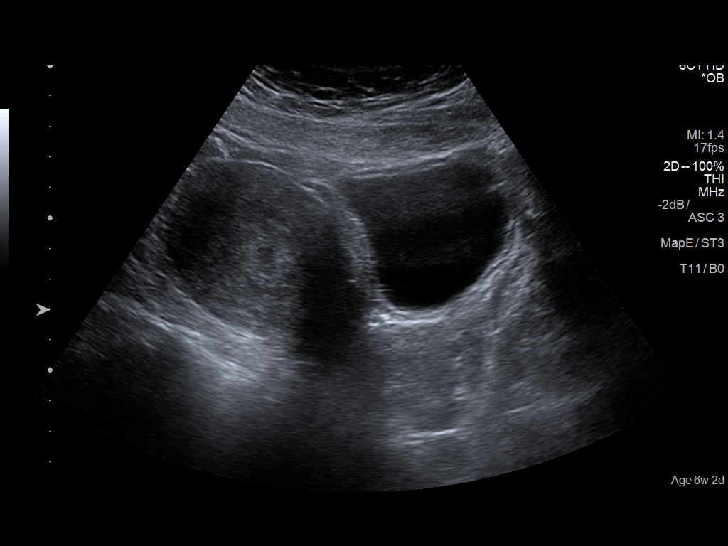
[im 39/81]
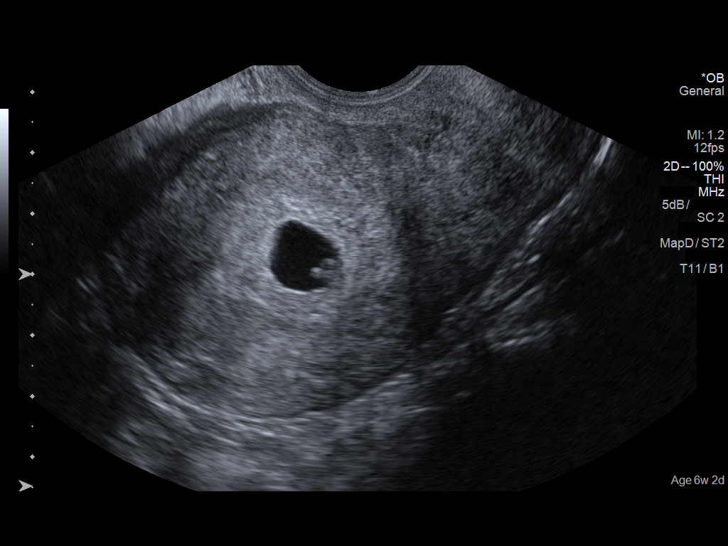
[im 45/81]
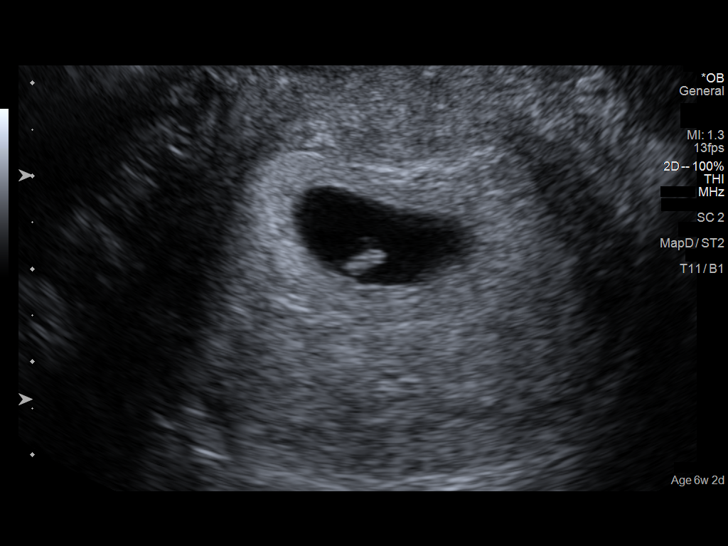
[im 51/81]
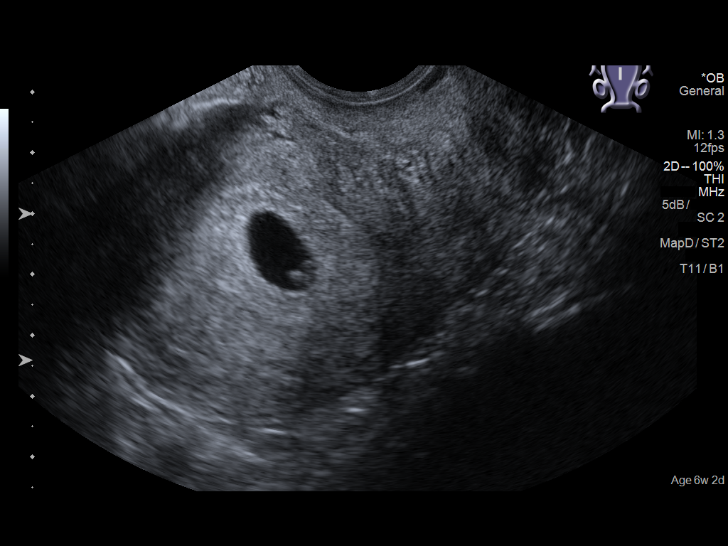
[im 57/81]
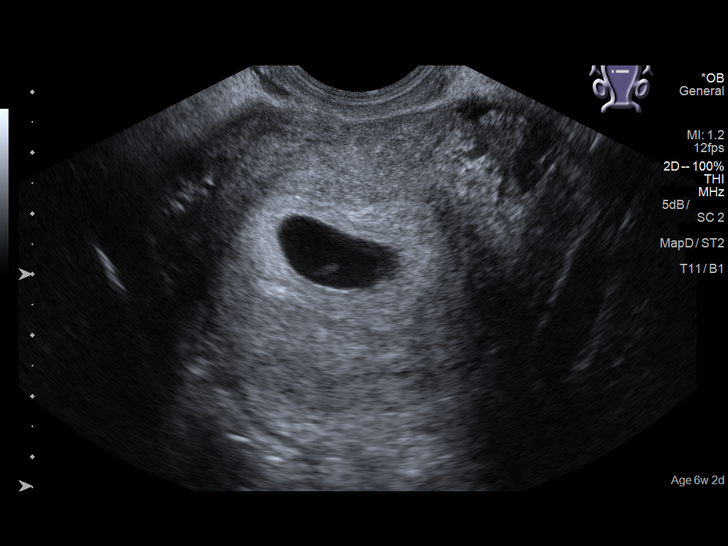
[im 63/81]
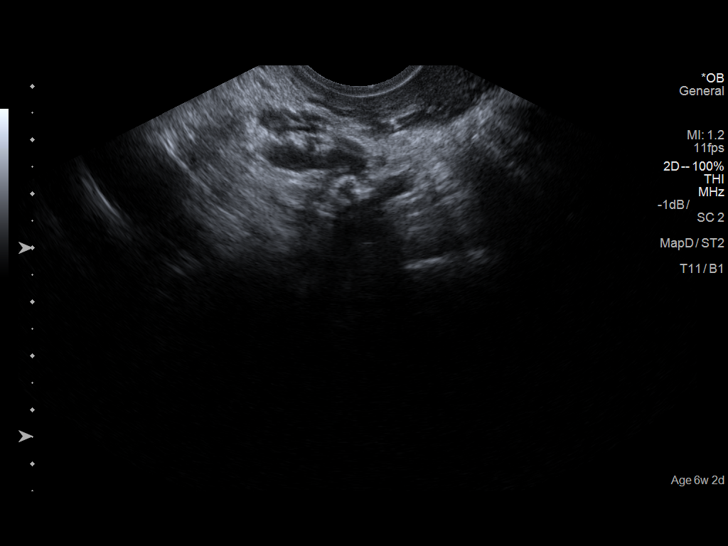
[im 69/81]
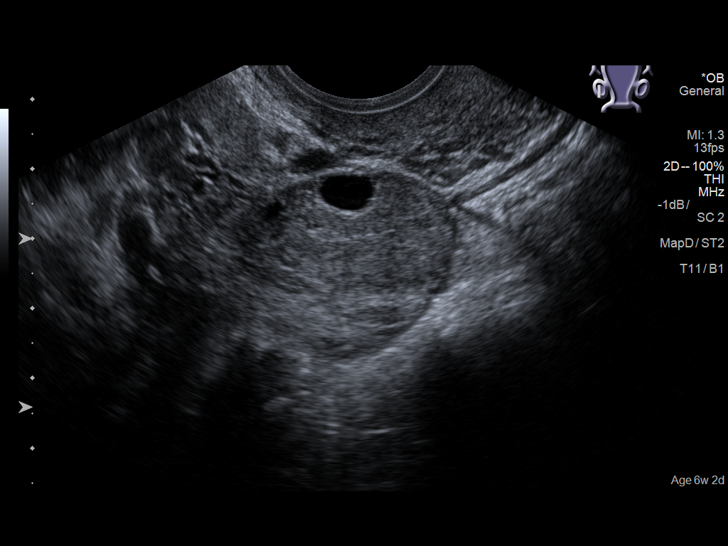
[im 75/81]
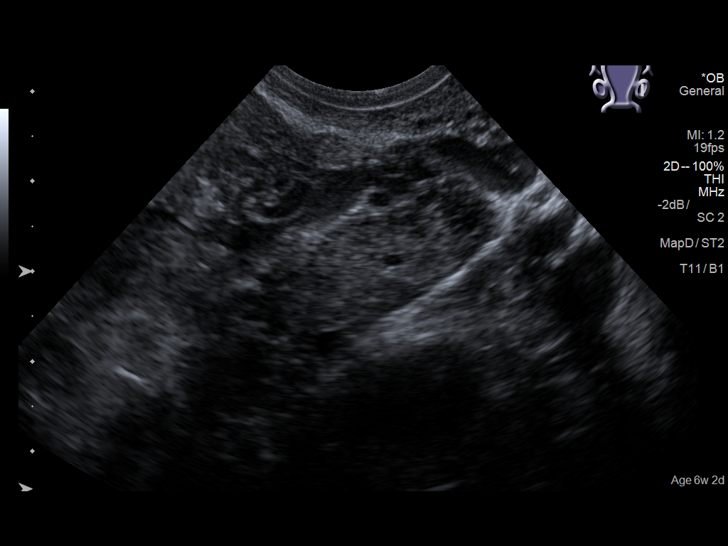
[im 81/81]
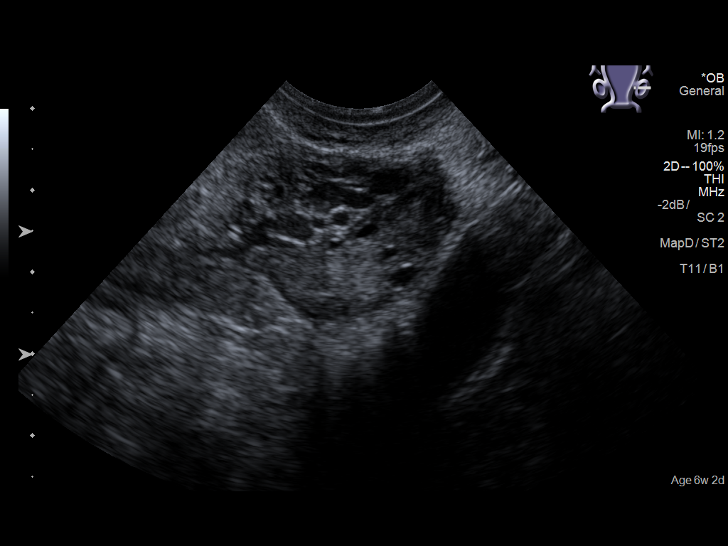

[14 of 28 positions shown; findings below may reference images not displayed]

FINDINGS: Intrauterine gestational sac: Single

Yolk sac:  Visualized.

Embryo:  Visualized.

Cardiac Activity: Visualized.

Heart Rate: 117 bpm

CRL:  4.6 mm   6 w   1 d                  US EDC: 10/31/2018

Subchorionic hemorrhage:  None visualized.

Maternal uterus/adnexae: No acute process identified. Right ovary
corpus luteum.
IMPRESSION: Single live intrauterine pregnancy with estimated gestational age of
6 weeks and 1 day. No acute process identified.

By: Camilli Skanes M.D.

## 2019-09-28 ENCOUNTER — Encounter (HOSPITAL_COMMUNITY): Payer: Self-pay
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
## 2019-09-29 NOTE — Historical Transplant Communications (Signed)
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 No significant cerebrovascular disease. 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 Plan to wean off seizure med  HD records BP 120s-140s typically. IDWG 0.5-2.5 kg    Pap  09/30/16 negative, HR HPV negative  Echo 10/01/16 ok  MPS 10/01/16 wnl    ____________________________________________________________________________________    Date: 10/08/2016  Type: Action  Entry By: Janett Billow  Attempted to call pt on both listed phone numbers to remind pt of their 4/10 appointments. For both attempts there was an automated voice saying "this number cannot accept calls at this time."  ____________________________________________________________________________________    Date: 10/05/2016  Type: Action  Entry By: Audrey Bullock  Call from pt reporting that pap and heart test are done. Writer requested records.  ____________________________________________________________________________________    Date: 10/04/2016  Type: Action  Entry By: [email or web address]  Mailed appointment reminder to patient.  ____________________________________________________________________________________    Date: 09/23/2016  Type: Action  Entry By: Natividad Brood  Scheduled apt w/ SW & RN for 10/12/2016. Clarified that someone was coming with her. Her mother will be her ride. Mentioned for her to please set up childcare for that day. Child will be in school. Mailed out reminder letter.  ____________________________________________________________________________________    Date: 09/21/2016  Type: Action  Entry By: Audrey Bullock  Call from pt, she in the car with her mom driving. She reports that she will be late , [place]. Audrey Bullock(Aunt) is not with them as she has to take care of her kids.     Called to SW/Felix to discuss above. Agreed to re-schedule SW appt in another time.    Called her back instructed to turned around as we can't accommodate her for a visit today. She got really upset and started yelling why she has to bring her mom for this visit. "Why can I just come by myself. My mom has a lot of things Bullock on". Both of them were arguing in the car. I asked her to clarify her statement about her mom. She reports  that her mom has her own medical health issues, multiple MD visit, undergoing PT and is slow. Again emphasize to her that her ability to understand  instructions and understand the requirement is very concerning. Every discussion and teach back is documented. She has been educated multiple times by Probation officer on importance in providing sustainable and acceptable care plan  for post txp. She again, focused her argument that she was never asked to bring a caregiver with her on previous appts  and that  we are making this process difficult for her and so on and so forth. I instructed her that I want to re-discuss her case tomorrow with her mom NOT driving.  She agreed , she will call in AM.    Positive Improvement : She has had issues with adherence in the past. So far she has been showing good adherence with DM and dialysis.  She has been very engage and always Radio broadcast assistant updates.    Ongoing Issues: Pt cont to lack insight, accountability  and understanding on the importance of identifying a solid care support. She has been instructed that due to the amount of care needed for SPK recovery(if she gets approved) and her limitation ( blind, has 36 year old child_) its important that she identifies a solid care plan and transportation. She brought her 74 year old child on last visit. She never once in our conversation took responsibility for that. She says, nobody told her that she cant bring her child on visits. So when she is reminded she gets annoyed to plan for child care. When asked what happen today. She never took responsibility and actually blamed it to the person who set-up the appt. This is not a surprised to me.     Plan: Hopefully they will call tomorrow. Again will emphasize that since her mom has health issues of her own. Its best that she bring another support system on SW revisit. She mentions her Aunt Audrey Bullock), her step-dad, her 47 year old brother. But sounds like they are also not dependable.    Forwarding  to [facility] re-schedule SW revisit. Emphasize that its mandatory she brings her support system, planned for child care, NOT be late, and to be at the appointment 15 minutes prior to the set time. Please document. I also want to see her after SW visit.  ____________________________________________________________________________________    Date: 09/17/2016  Type: Action  Entry By: Natividad Brood  Scheduled apt with patient to also see RN after SW appointment.  ____________________________________________________________________________________    Date: 09/16/2016  Type: Action  Entry By: Natividad Brood  Scheduled apt w/SW on 09/21/2016. Pt will bring mom, Sunday Spillers and her son will be in school. Mailed out reminder.  ____________________________________________________________________________________    Date: 09/16/2016  Type: Action  Entry By: Natividad Brood  Scheduled apt w/SW on 09/21/2016. Pt will bring mom, Sunday Spillers and her son will be in school. Mailed out reminder.  ____________________________________________________________________________________    Date: 09/16/2016  Type: Action  Entry By: Natividad Brood  Scheduled apt w/SW on 09/21/2016. Pt will bring mom, Sunday Spillers and her son will be in school. Mailed out reminder.  ____________________________________________________________________________________    Date: 09/16/2016  Type: Action  Entry By: Audrey Bullock  Call from pt. still awaiting  SW revisit. Nobody have called her yet. Writer will forward request again.  ____________________________________________________________________________________    Date: 09/16/2016  Type: Action  Entry By: Audrey Bullock  Call from pt. still awaiting  SW revisit. Nobody have called her yet. Writer will forward request again.  ____________________________________________________________________________________    Date: 09/13/2016  Type: Action  Entry By: Audrey Bullock  VM from pt. Setting up stress and echo thru [place]. Pap thru  PCP.  ____________________________________________________________________________________    Date: 09/03/2016  Type: Action  Entry By: Audrey Bullock  Addendum:To schedule RN Coordinator visit with SW    ____________________________________________________________________________________    Date: 09/03/2016  Type: Action  Entry By: Audrey Bullock to pt about the review below. She can move forward in updating heart test. This can be done  at [facility]. Instructed to call Audrey. Devin Going MA. Shed will need SW revisit. She will bring her mom and care giver. I warned her that appt will be cancelled if she shows up with her 32 year old child. She verbalized understanding and promised that this incident will NOT happen again.    Spoke to Diana/[place]. They will order stress and echo    Forwarding to PC to sched SW revisit.  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 No significant cerebrovascular disease. 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 Plan to wean off seizure med  HD records BP 120s-140s typically. IDWG 0.5-2.5 kg    Cleared to proceed from my perspective  ____________________________________________________________________________________    Date: 08/30/2016  Type: Action  Entry By: Audrey Bullock  neuro clearance and dialysis records to Lorriane Shire to review  ____________________________________________________________________________________    Date: 08/19/2016  Type: Action  Entry By: Audrey Bullock to pt to remind the kidney center to fax the dialysis records tomorrow.  ____________________________________________________________________________________    Date: 08/17/2016  Type: Action  Entry By: Royal Piedra at [facility], discussed the case and re-faxed the below communication.  ____________________________________________________________________________________    Date: 08/10/2016  Type: Action  Entry By: Audrey Bullock  Fax to Delynn Flavin, MD: Rosalee Kaufman ! Thanks for seeing Ms. Georgi.Can we have a copy of the cerebrovascular imaging completed at [facility] faxed to our office. You can discuss her case with Vanessa/ARNP for transplant nephrology at [phone]. Her email address:[email or web address]. Thanks,Audrey Bullock-RN  ____________________________________________________________________________________    Date: 08/10/2016  Type: Action  Entry By: Audrey Bullock  Fax to dialysis center: [facility]. Hi!&#[phone]; Please fax summary of dialysis records of the last 30 days reflecting pre and post BP,Weight and IDWG. (monthly physician rounding report)Thanks.Audrey Bullock-RN -  ____________________________________________________________________________________    Date: 08/05/2016  Type: Action  Entry By: Audrey Bullock  Fax to dialysis center: [facility]. Hi!Please fax summary of dialysis records of the last 30 days reflecting pre and post BP,Weight and IDWG. (monthly physician rounding report)Thanks.Audrey Bullock-RN   ____________________________________________________________________________________    Date: 08/03/2016  Type: Action  Entry By: Audrey Bullock  Call from pt. reporting that she completed neuro and brain scan. Writer will get records for review, she will be called on the next step.  ____________________________________________________________________________________    Date: 07/07/2016  Type: Action  Entry By: Audrey Bullock  Fax to Audrey Mckusick, MD: Good Morning Audrey. Devin Bullock! See enclosed progress note.Ms. Minichiello was discussed at our selection in December. We have not made determination on her candidacy yet. Missing goals:  1.Neuro: Priority for now is for her to have a thorough evaluation and clearance by Neurology on  PRES/CVA as well as medication recommendations for  seizure disorder.   2.Need to investigate her CP. She brought her 23 year old child during the visit.She denied ever instructed to bring her primary caregiver which is her mom. She will require SW revisit once # 1 is acceptable.  3.Update heart test once number 1 is acceptable.  I spoke to her on 12/28  about our concerns and instructed to f/up with you on the Neuro referral. Thanks,Audrey Bullock-RN  ____________________________________________________________________________________    Date: 07/01/2016  Type: Action  Entry By: Audrey Bullock  to pt about the outcome of the board review. Emphasized to her that the team have NOT made determination on her candidacy. She need the following:  1. Neurology eval. Pt reports that she was already evaluated at [facility]. But she was referring to the inpatient neurology. Discussed that  due to PRES/Seizure/Stroke issues she need to have an establish care with an OP Neurologist. This neurologist need to do a complete evaluation on above and provide [place] with clearance. Writer will fax request to Audrey. Carleene Cooper office on Monday, instructed her to f/up by Tuesday .  2. Heart test will expire in  Jan 2018. Those will have to be updated but timing will depend on 1.  3. Careplan- I  relayed to her our concern , she brought her 2 year old child on the visit. She insisted that her mom is her the primary CG which was not around.  When press on child care, she said step dad will provide care to her child. Our social work will investigate her CP and this require another visit at [facility] once number 1 is satisfied.  4.Possible Psyche visit for depression.  Verbalized understanding on above.       ____________________________________________________________________________________    Date: 07/01/2016  Type: Action  Entry By: [email or web address]  Received a call from the patient wanting an update on her case. Explained that this would need to come from her Associate Professor. She  understood.  ____________________________________________________________________________________    Date: 06/24/2016  Type: Medical info  Entry By: Audrey Bullock  Discussed in Selection Conference on 06/24/2016: Deferred:Refer to neuro. Investigate on caregiver plan. Very debilitated. Depression. Return to clinic after rehabbed. May be a k/p candidate.  ____________________________________________________________________________________    Date: 06/22/2016  Type: Action  Entry By: Audrey Bullock  Fax to dialysis center: [facility]. Hi ! We are evaluating Ms. Bracey fluid status and compliance to dialysis. Can you fax summary of the last 30 days of dialysis records (PRE and POST [place]).Also include any short and missed runs. Thanks,Audrey Bullock-RN  ____________________________________________________________________________________    Date: 06/18/2016  Type: Action  Entry By: Linus Mako pt and confirmed appointments on 06/22/2016 starting at 1:00PM.   ____________________________________________________________________________________    Date: 06/09/2016  Type: Action  Entry By: Eliane Decree w/ [facility] SW, scheduled RD, Neph, Surgeon revisits on 06/22/16. Reminder letter faxed to SW to set up transportation for pt. Fax number [phone]  ____________________________________________________________________________________    Date: 06/02/2016  Type: Action  Entry By: Eliane Decree from [place], SW at [facility], wants to help assist pt in scheduling revisit appts. Pt is coming in today and she will see what dates she is free and call back.  ____________________________________________________________________________________    Date: 04/16/2016  Type: Action  Entry By: [email or web address]  CT ABD/PEL (10/15/15) images are uploaded to PACs  ____________________________________________________________________________________    Date: 04/15/2016  Type: Action  Entry By: [email or web address]  Rec'd via [employer] CT ABD/PEL report and disc. Uploaded  images and nominated to [facility]  ____________________________________________________________________________________    Date: 04/13/2016  Type: Medical info  Entry By: [email or web address]  Received a call from Audrey. Carleene Cooper office, wanting to notify us that patient is currently inpatient at [facility] with an ankle fracture and will be unable to attend her appts this afternoon. Patient plans to call us back once discharged to discuss re-scheduling.  ____________________________________________________________________________________    Date: 04/09/2016  Type: Action  Entry By: Janett Billow  TC to pt and confirmed their 10/10 appointments starting at 1pm.  ____________________________________________________________________________________    Date: 04/05/2016  Type: Action  Entry By: [email or web address]  Mailed appointment reminder to patient.  ____________________________________________________________________________________    Date: 03/16/2016  Type: Action  Entry By: Elissa Hefty with pt. Pts appts moved to Oct 10th and added Surgeon and RD to see pt. Mailed now appt reminder to pt.   ____________________________________________________________________________________    Date: 03/11/2016  Type: Action  Entry By: Susanne Greenhouse  Pt also need to see RD and Surgeon. LVM for pt  to please call me back so we can reschedule her to a date where all appointments can be made.   ____________________________________________________________________________________    Date: 03/10/2016  Type: Action  Entry By: Audrey Bullock to mother to introduce self as new coordinator and inquire about the required follow-up since her d/c at [facility]. Discussed the following, verbalized understanding of the plan below.    *Neuro appt. Audrey Bullock not aware that she need to f/up with a neurologist. Discussed that since her CVA and Seizure episode that required hospitalization in May in [place]. Recent dx of PRES from [facility], recommendation was to f/up with a specialist. She would like a [place]  provider since its closer. Referral to [facility] faxed by writer this afternoon. Provided her with contact info so she can f/up on the referral.  *Tanina was d/c with Gastrostomy with plans of removal by end of July. Unfortunately, she still has the tube ( clamp and cap misplaced, its cap with tape only per mom). She is schedule to see a GI provider at [facility] on 9/19.  *Admission in June, Bp was 200/100. Per Wardell Honour takes all her meds. Instructed to monitor BP 2x daily. Sending log sheet and to bring records on apt with transplant on 9/28  *Nutrition- Per her report, swallowing issues have resolved. She is back to baseline with eating. From [facility] , some weight loss, BMI 23  ( was 27 previously) Writer will add RD visit to determine nutritional risk, Audrey Bullock is ok with the plan.  *Surg revisit- Need surg eval if still a K/P candidate. Its best that we coordinate a revisit with them as well.    Forwarding to Jacie to add RD and Surg.    ____________________________________________________________________________________    Date: 03/04/2016  Type: Action  Entry By: Susanne Greenhouse  TC to pt. Scheduled for Neph revisit on 04/01/2016. Pt is now with [facility]. RN coordinator switched to [employer]     Mailed apt reminder to pt.  ____________________________________________________________________________________    Date: 03/02/2016  Type: Check out  Entry By: Carloyn Manner  Pt's wu was done previously. Unfortunately her recent CVA has most likely made her a candidate for kid alone, not a KP.    Let us begin with Tx Neph revisit. See what Lorriane Shire will recommend for work up/candidacy.   ____________________________________________________________________________________    Date: 03/02/2016  Type: Chart in review  Entry By: Audrey Bullock from MSW at [facility] - pt now dialyzes there and wishes to be re-referred to [facility] for transplant eval.  She is living with her mother.  Cell number is [phone] and home number of  mom Audrey Bullock) is [phone].  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 Spoke with Audrey. Court Joy who helped care for her at [facility]. She states that Ms. Tauzin is now medically stable. They found a dialysis center for her but she does not have a new nephrologist  yet. She does not think she plans to move back to [place]. Since she's deferred on our list and there is no plan for re-referral, I recommend we follow up in 1 -2 months if she is still interested in transplant here and bring her back in for a revisit with me and SW. Likely not a KP candidate but may remain a K alone candidate.  ____________________________________________________________________________________    Date: 12/26/2015  Type: Chart in review  Entry By: morantes  We anticipated pt to be approved & listed [facility] [place]. Noting had recent significant event that required her to move back to [place].     ?candidacy.  ____________________________________________________________________________________    Date: 12/26/2015  Type: Chart in review  Entry By: morantes  Noting 12/25/15 DC Summary from [facility]:  'She moved to [place] 2 days ago from [place] to be cared for by her mother, as she recently had a prolonged and complicated hospital course while in [place] at the end of May 2017 that left her blind and with focal neurologic weakness and gait instability. She was hospitalized after she suddenly collapsed while walking on the street, and ultimately diagnosed with a CVA, PRES (though per notes there was an MRI done that showed no bleed or infarct), L>R UE and LE deficits and blindness, requiring G tube placement for feeds, with her course complicated by respiratory failure requiring intubation, aspiration pneumonia, and difficult to control HTN on 5+ antihypertensives. Since her initial  hospitalization she thinks her strength is improving, and now is able to take in PO'  ____________________________________________________________________________________    Date: 12/05/2015  Type: Financial  Entry By: Michelene Gardener  Verified active [place]  Medicare A/B/D. Patient is fin cleared for kidney tx listing. No PA required. Patient has been thoroughly counseled regarding the Medicare 20% patient responsibility.  ____________________________________________________________________________________    Date: 12/05/2015  Type: Action  Entry By: Carloyn Manner  New address updated in records.  ____________________________________________________________________________________    Date: 12/05/2015  Type: Chart in review  Entry By: [place] records.  Pt in wu for KP.  Many updates done.    I will forward to [facility] for follow up to see if pt has maintained Ali Mol[place] Medicare in case we move forward to dual listing.  If insurance lapsed, will recommend closing referral.   ____________________________________________________________________________________    Date: 07/30/2015  Type: Action  Entry By: Carloyn Manner  I spoke to [facility] SW, they received records and have referred Cartier to [facility].  I let her know it was our intention to list here, but were notified that we do not dual list KP, if not local.  This will be reassessed once she is listed in [place].  I find Carita to be a much better candidate at this time.   ____________________________________________________________________________________    Date: 07/25/2015  Type: Action  Entry By: Carloyn Manner  Full records confirmed faxed to [facility].   ____________________________________________________________________________________    Date: 07/25/2015  Type: Medical info  Entry By: Carloyn Manner  Discussed in Selection Conference on 07/24/2015: Unusual to list a KP if not local.  Will recommend pt be referred locally, listed, then bring back for full Team (yearly) follow up to discuss  possible dual listing.   ____________________________________________________________________________________    Date: 07/25/2015  Type: Action  Entry By: Carloyn Manner  Fax to dialysis center: [facility]. Our Transplant Team requesting patient be referred to local Tx cneter fo rKP evaluation. Once listed locally, we will do the same.  I  spoke to patietn. She is dissapointed on having to have listing delayed. I will reach out to [facility] SW to initiate referral. Records enclosed.  ____________________________________________________________________________________    Date: 07/25/2015  Type: Action  Entry By: Lajuana Carry, [phone], Fax [phone].     Numbers are wrong in Audrey Bullock. I will forward to update.   ____________________________________________________________________________________    Date: 07/23/2015  Type: Action  Entry By: Audrey Bullock  TC with patient.  Her post-tx care plan is to stay with her mother at her home in [place] near [place].  Her mother does not work outside the home and is available 24/7 for both in-home care as well as transportation to/from [facility] for appointments.  Appears to have a reasonable plan in place at this time.  ____________________________________________________________________________________    Date: 07/22/2015  Type: Action  Entry By: Carloyn Manner  I spoke to pt, she has Merry Proud Harder's contact info to confer with him on her post tx care plan for clearance to list active for KP.  ____________________________________________________________________________________    Date: 07/18/2015  Type: Action  Entry By: Audrey Bullock  Left VM for pt to call back to update post-tx care plan.  ____________________________________________________________________________________    Date: 07/17/2015  Type: Chart in review  Entry By: vrichard  please see my note 09/03/14    Moved to [place], apparently doing better there. Had decided to delist there but I understand wants to dual list  now.    Ongoing issues with pHTN - most recent sPAP 43- 48 mmHg. BP in clinic was 212/112. New echo 07/09/15 shows EF 60%, concentric LVH, LAE, PASP 36 mmHg. MPS 07/09/15 Normal.    Looks to be doing better from cardiovascular perspective but most recent concerns were social so request SW re-eval. Living in [place] now but would be do for revisit in March. Can she come up?  ____________________________________________________________________________________    Date: 07/16/2015  Type: Chart in review  Entry By: morantes  Local  Echo & stress test received, notable improvement to pulmonary HTN. Will forward records to [facility] for final clearance to list for KP.   ____________________________________________________________________________________    Date: 07/11/2015  Type: Phone message  Entry By: Susanne Greenhouse  Received VM from pt stating she has completed her stress test/EKG on 07/09/2015 at [facility]. SHe saw her eye doctor on 06/17/2015. Requested records.   ____________________________________________________________________________________    Date: 07/09/2015  Type: Action  Entry By: Carloyn Manner  Pt scheduled for stress test, ECHO & EKG thru [facility] today.  Plan is to fax [place] results. Then when we receive and clear her for listing, the dialysis center will request full records to refer to local Tx Center for eval(dual listing).    ____________________________________________________________________________________    Date: 06/06/2015  Type: Action  Entry By: [place]:  [facility]  [address] [place] [address]  Phone: [phone]  Fax: [phone]    ____________________________________________________________________________________    Date: 06/06/2015  Type: Financial  Entry By: Sherolyn Buba patient regarding her insurance. Mikaia stated, she is living in [place] and has both Medicare and Medical which she stated will cover the Medicare 20% cost  share.    Patient fin cleared for tx listing in [place] state with Medicare A/B/D. No 2ndary coverage-- was counseled regarding the Medicare 20% cost share.  ____________________________________________________________________________________    Date: 06/06/2015  Type: Action  Entry By: Sloan Leiter to Belenda Cruise to confirm [facility] (Medicare) to dual list (  [place] & here).    I LVM for pt, checking with our Financial Counselor-Katherine to see if all is good to continue with (very extended) evaluation for KP.  ____________________________________________________________________________________    Date: 06/05/2015  Type: Action  Entry By: Susanne Greenhouse  TC from pt requesting me to fax Audrey. Idamae Lusher the info on what testing we need to complete her WU's. Fax sent. Pt is seeing ophthalmologist on 06/17/2015. Pt unsure Audrey.s name but will contact me once that appt and Stress and EKG are completed so I can request records.   ____________________________________________________________________________________    Date: 02/13/2015  Type: Action  Entry By: Carloyn Manner  I spoke to pt, she has relocated to [place], will go to school. She unfortunately let her Medicare lax, she is re-applying for this.   We discussed her plans for transplant now that she is in [place].  She would like to dual list. She has establish with a PCP, they will arrange to have stress test & EKG completed. Her family does remain in [place], so travel here is not an issue.   She has my #, she will call if needs records.     Sandria Manly is aware it has been a long road to complete wu. She is very stable now, in her glucose w/insulin pump, echo with volume control. Once approved for K/P her wait time should be short.  ____________________________________________________________________________________    Date: 02/13/2015  Type: Action  Entry By: Susanne Greenhouse  TC with pt. She has moved to [place] and has established care with new nephrologist Audrey. Lanier Prude. Pt is requesting we send  records to his office. She would like to continue work up process at [facility] and will also be referred to a transplant center in [place] for work up as well. She is aware of the testing we still need her to complete for her eval here. She would like me to fax Audrey. Idamae Lusher the list of tests so he can assist her in scheduling them in [place].       Address updated. Requested CXR via [facility] from [place].   ____________________________________________________________________________________    Date: 02/05/2015  Type: Chart in review  Entry By: vrichard  reviewed care everywhere:  Audrey. Drexel Iha 7/18 Transplant: Evaluation is ongoing through the [facility] [place]. She will contact them to discuss her upcoming move to [place].  Please f/u. We are happy to assist in transfer of records.  ____________________________________________________________________________________    Date: 01/10/2015  Type: Action  Entry By: Susanne Greenhouse  Pt was only able to complete first portion of stress test on 01/03/15 due to her bringing child to test.     LVM to pt to discuss rescheduling second portion of stress test.   ____________________________________________________________________________________    Date: 01/08/2015  Type: Chart in review  Entry By: vrichard  echo: PASP has decreased even farther to 38-43 mmHg and her EF is 75%.   just pending the stress test, otherwise up to date and cleared by Merry Proud below  ____________________________________________________________________________________    Date: 12/04/2014  Type: Action  Entry By: Susanne Greenhouse  TC to pt. Scheduled Stress test and ECHO on 01/03/2015. Appt letter mailed to pt.  ____________________________________________________________________________________    Date: 11/27/2014  Type: Action  Entry By: Susanne Greenhouse  Orders pended for Stress test, ECHO, EKG, CXR.  ____________________________________________________________________________________    Date:  11/20/2014  Type: Action  Entry By: vrichard  Would like repeat stress/echo but do not feel that pulm consult is needed for clearance to list (  since primary differential was fluid overload). No revisit necessary  ____________________________________________________________________________________    Date: 11/18/2014  Type: Chart in review  Entry By: morantes  Per last note of Audrey. Danne Baxter, pt will need f/u with pulmonary for "Cough and wheezing: symptoms are worse when she lies flat we again reviewed the chronic use of Phenergan/codeine syrup especially in the setting of using Percocet."    I see no appt on this.  I LVM for MA for f/u. This will be the last testing needed.     I will inquire with Lorriane Shire if pt needs a revisit (she is due for card studies)      ____________________________________________________________________________________    Date: 11/18/2014  Type: Action  Entry By: Carloyn Manner  Positive feedback form [place] RN, She has improved in monthly labs, turning in logs, even volume for most part is better. I requested records.   ____________________________________________________________________________________    Date: 11/14/2014  Type: Action  Entry By: morantes  Noting SW clearance. Forwarding to [place] for financial clearance to active from Status 7 (noting needed secondary coverage).  ____________________________________________________________________________________    Date: 11/14/2014  Type: Chart in review  Entry By: Audrey Bullock  VM from the MSW at kidney center.  Pt's dialysis adequacy is improved, within the expected range.  Doing dialysis on a regular expected basis.  No adherence concerns at this time with the kidney center.  ____________________________________________________________________________________    Date: 11/14/2014  Type: Action  Entry By: Lake Bells  TC with pt regarding post-tx care plan.  Her plan remains unchanged.  She will stay with her mother at [facility] home in [place].  Her  mother does not work outside the home and is available full-time for both in-home care as well as transportation to/from [facility] for outpt f/u appts.  Her fianc is also available for b/u assistance.  Pt also plans on moving in with her mom permanently in the near future.  Appears to have reasonable post-tx care plan in place.  SW will f/u with kidney center on adherence.  ____________________________________________________________________________________    Date: 11/13/2014  Type: Action  Entry By: Audrey Bullock  Left VM for pt to call back to update post-tx care plan.  Also left VM for MSW at kidney center to update adherence with dialysis.  ____________________________________________________________________________________    Date: 10/04/2014  Type: Action  Entry By: Audrey Bullock with MSW at kidney center.  States the pt has made some improvements in adherence recently.  Dialyzed four times a week in March, didn't miss any runs but some were short.  Next month's labs will tell how good the adherence has been.  Working, overstretched.  Good family support.  Is still on a care agreement for compliance with the kidney center.  ____________________________________________________________________________________    Date: 10/02/2014  Type: Action  Entry By: Audrey Bullock  Left VM for pt to call me back to update care plan, other issues.  ____________________________________________________________________________________    Date: 09/09/2014  Type: Email message  Entry By: vrichard  thank you for your email. I realize this has been a complicated/prolonged workup. thank you for asking the social worker to touch base with her. I will also continue to work on these issues in clinic. Obviously, I want her to be clinically compensated and compliant before transplant.  thanks!  Laney Potash  ____________________________________________________________________________________    Date: 09/06/2014  Type: Action  Entry By: Carloyn Manner  I spoke  to pt, made her aware she is not ready to be listed, we will need  improved BP & complete dialysis runs. See below. Pt was upset, she states she has been ready. I asked that she show improved commitment to her medical care. She has done so well with interdialytic weight gain, she just needs a little better focus.    I called Audrey. Peggye Pitt office left message on above with her MA.    I called the [facility] spoke to home hemo RN-Marnie, she will help keep pt focused.  ____________________________________________________________________________________    Date: 09/05/2014  Type: Medical info  Entry By: Herma Mering  Discussed in Selection Conference on 09/05/2014: Deferred. 6 month hold. High blood pressue/K+. Merry Proud to follow up. Adherence, financial stressors, home life. Lorriane Shire to contact Audrey. Danne Baxter.   ____________________________________________________________________________________    Date: 09/03/2014  Type: Action  Entry By: Carloyn Manner  I LVM for pt, please call [facility] to discuss plan for treatment for hyperkalemia.  ____________________________________________________________________________________    Date: 09/03/2014  Type: Action  Entry By: Carloyn Manner  I spoke to [facility], They will consult with manager to see if pt should dialyze at center or go to ER for hyperkalemia treatment.   ____________________________________________________________________________________    Date: 09/03/2014  Type: Action  Entry By: Carloyn Manner  Fax to dialysis center: [facility]. Forwarding labs, as notified, K+ 6.4  ____________________________________________________________________________________    Date: 09/02/2014  Type: Action  Entry By: Alvera Singh  TC from pt, scheduled neph revisit for 09/03/14 at 1PM. Pt stated no letter needed and she will be there.   ____________________________________________________________________________________    Date: 08/28/2014  Type: Action  Entry By: Carloyn Manner  I LVM for pt again, cleared to list, but needs  a revisit Tx Neph Vanessa. I would also like to meet with her to discuss listing responsibilities. I left Kristina's #.  ____________________________________________________________________________________    Date: 08/25/2014  Type: Action  Entry By: morantes  No response from pt. Will hold listing active on KP wait list until seen.   ____________________________________________________________________________________    Date: 08/13/2014  Type: Financial  Entry By: [employer] verified, pt fin cleared, wu eval and tx listing. No PA required.  ____________________________________________________________________________________    Date: 08/08/2014  Type: Action  Entry By: Carloyn Manner  I LVM for pt, she has been cleared to list once FC has been obtained. She is due to revisit with Tx Neph-Vanessa. Please call Carmell Austria to arrange this.  ____________________________________________________________________________________    Date: 08/08/2014  Type: Medical info  Entry By: Herma Mering  Discussed in selection 08/08/2014: approved pending financial clearance.   ____________________________________________________________________________________    Date: 08/02/2014  Type: Phone message  Entry By: Alvera Singh  TC to pt, LVM about scheduling Neph revisit  ____________________________________________________________________________________    Date: 08/02/2014  Type: Email message  Entry By: Carloyn Manner  From: Carlena Sax [[email or web address]   Sent: Thursday, August 01, 2014 4:45 PM  To: 'Blosser, Christopher'  Cc: [email or web address]  Subject: RE: You have a message in [place],    Can you add [place]  onto [facility] next week? Thank you :)    Carlena Sax, ARNP      Will do, she is due for a revisit with you. Awaiting to be scheduled.  ____________________________________________________________________________________    Date: 07/26/2014  Type: Chart in review  Entry By: vrichard  reviewed echo. pulm htn has  improved from 49-54 mmHg to 43-48. to Audrey. Nelly Rout on listing status  ____________________________________________________________________________________    Date: 06/27/2014  Type: Action  Entry By: Carloyn Manner  Please schedule for re-visit  with Lorriane Shire.Attending & Surgeon after echo is done on 07/18/14 (error on previous message, not 07/19/13).  ____________________________________________________________________________________    Date: 06/19/2014  Type: Chart in review  Entry By: Carloyn Manner  Per Carlena Sax, needs the echo.    Scheduled for 07/19/13  ____________________________________________________________________________________    Date: 06/19/2014  Type: Chart in review  Entry By: vrichard  pulm consult 05/20/14 consider targeting a lower dry weight and more frequent HD runs before further workup  ____________________________________________________________________________________    Date: 06/19/2014  Type: Action  Entry By: Carloyn Manner  I spoke to Audrey. Jacqulyn Cane MA-Diana, pt has been seen by Pulmonary. Pt continues home hemo. Continues with volume issues. Forwarding chart to [facility] for review.  ____________________________________________________________________________________    Date: 06/19/2014  Type: Action  Entry By: Carloyn Manner  Looks like a no show for 06/18/14 echo (again).  Will not reschedule. Awaiting to here if she has a appt with Pulmonary @ [facility].  ____________________________________________________________________________________    Date: 06/19/2014  Type: Action  Entry By: Carloyn Manner  I LVM for pt, please call me. This is in f/u to Audrey.Oliver's recommendation for a Pulm consult.. Has she been scheduled for this??    To note updated pt had a repeat echo on7/31/15, cont with mod Pulm-HTN (44-10mHg, slight improvement)  ____________________________________________________________________________________    Date: 06/03/2014  Type: Action  Entry By: rAlvera Singh TC to pt, scheduled ECHO for 06/18/14 at  1:20PM. Letter sent to pt.   ____________________________________________________________________________________    Date: 05/24/2014  Type: Action  Entry By: mCarloyn Manner I called pt on cell, we discussed her no show for echo.  She was unaware of this  (not sure she listens to her messages, we will need to make sure we talk to her directly for scheduling appt's).  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  ____________________________________________________________________________________    Date: 05/24/2014  Type: Phone message  Entry By: vrichard  Attempted to reach pt regarding no show to echo 05/02/14. Her cell and her emergency contact cell are not in service and she is "no longer with" the listed employer. Please send a letter regarding interest in transplant, updated demographics.  ____________________________________________________________________________________    Date: 04/25/2014  Type: Action  Entry By: eeog  TC to pt. Scheduled repeat Echo for 05/02/14.    Letter sent to pt.   ____________________________________________________________________________________    Date: 04/25/2014  Type: Action  Entry By: eeog  TC to pt. LVM to schedule Echo.   ____________________________________________________________________________________    Date: 04/24/2014  Type: Action  Entry By: eeog  TC to pt. LVM to schedule Echo.  ____________________________________________________________________________________    Date: 04/22/2014  Type: Action  Entry By: eCathren Laineorder for repeat Echo.   ____________________________________________________________________________________    Date: 04/22/2014  Type: Action  Entry By: mCarloyn Manner I LVM for pt. Is she ready to schedule for repeat echo??  By now her fluid status should have improved. PLease let uKoreaknow if she is ready. I will forward to LKathlee Nationsto schedule  '@[place]' .  ____________________________________________________________________________________    Date: 04/10/2014  Type: Action  Entry By: vrichard  Pt due for repeat echo if she is amenable  ____________________________________________________________________________________    Date: 02/07/2014  Type: Phone message  Entry By: vrichard  I spoke with Audrey. ODanne Baxter- she thinks that the likely cause of Ms. Meline's elevated PAP is fluid overload. I discussed with Audrey. BNelly Routand the plan would be to recheck  her echo again in 2 months, a PAP in the 30s would likely be safe for surgery. I discussed all of this with the patient and she is upset that she cannot be active on the list at this time but verbalizes understanding of the plan.  ____________________________________________________________________________________    Date: 02/07/2014  Type: Phone message  Entry By: vrichard  LVM with Audrey. Peggye Pitt office to discuss echo results  ____________________________________________________________________________________    Date: 02/05/2014  Type: Action  Entry By: Cletus Gash with Audrey. Peggye Pitt office to discuss echo results  ____________________________________________________________________________________    Date: 02/04/2014  Type: Action  Entry By: vrichard  Reviewed echo. EF 69%. estimated pulmonary artery pressure of 44 mmHg to 49 mmHg. "Images were compared to the prior study dated 10/26/2013. The hypertension and hypertrophy was seen previously. Pulmonary pressures are slightly lower today." Would we like a pulmonary consult?  ____________________________________________________________________________________    Date: 02/04/2014  Type: Action  Entry By: Alvera Singh  TC from pt, she stated ECHO was completed on 02/01/14 and was wondering if there is anything else she needs to do. Advised her that I will speak with Moshe Salisbury about her results.    ____________________________________________________________________________________    Date: 01/21/2014  Type: Action  Entry By: [email or web address]  Scheduled Echo and patient has been called.  Letter sent.  ____________________________________________________________________________________    Date: 01/15/2014  Type: Action  Entry By: [email or web address]  Pended orders for ECHO.  Will call patient once signed.  Received call from pt and called her back to let her know the orders are pended.  Left V/M.  ____________________________________________________________________________________    Date: 01/07/2014  Type: Action  Entry By: [email or web address]  Received from [facility] EKG.  ____________________________________________________________________________________    Date: 01/03/2014  Type: Medical info  Entry By: Carloyn Manner  Discussed in Selection Conference on 01/03/2014: P-HTN, needs better fluid removal, adherence to HD, Pt will change to HD in cneter instead of at home. after a good 2w of better dialysis, she is to repeat echo.  If echo unchanged, concider Pulm consult. Audrey. Nelly Rout to talk to Audrey. Danne Baxter.  ____________________________________________________________________________________    Date: 01/03/2014  Type: Phone message  Entry By: vrichard  I spoke with Audrey. Danne Baxter - she plans to see the patient on Monday, will discuss ideal adherence to home hemodialysis and will schedule repeat echo 2 weeks out on her end. She is hoping that the patient not return to in-center dialysis because she does better on home hemodialysis.  ____________________________________________________________________________________    Date: 01/03/2014  Type: Action  Entry By: [email or web address]  Referral to pulmonary submitted.  ____________________________________________________________________________________    Date: 01/03/2014  Type: Action  Entry By: Lannie Fields for Pulmonary consult: ''34 yr old female with ESRD,  long standing HTN, pulmonary HTN. per Gardiner Ramus, ARNP, Pulmonary consult  to determine candidacy and risk''.  ____________________________________________________________________________________    Date: 01/03/2014  Type: Action  Entry By: Carloyn Manner  Fax to dialysis center: [place]. Requesting records: the most recent flow sheet noting BP (pt remains hypertension, also noting moderate pulmonary hypertension), kardex, any compliance concerns and vaccination records. Please fax. Thank you  ____________________________________________________________________________________    Date: 12/27/2013  Type: Action  Entry By: genelyng  Patient called back and confirmed her appointment for Monday 06/29 at 1:00 PM.  ____________________________________________________________________________________    Date: 12/27/2013  Type: Phone message  Entry By: genelyng  Left voicemail reminding patient of her appointment on Monday 06/29. Left Betsy's number for appointment  confirmation.   ____________________________________________________________________________________    Date: 12/25/2013  Type: Action  Entry By: [email or web address]  Mailed appointment reminder to patient.  ____________________________________________________________________________________    Date: 12/25/2013  Type: Phone message  Entry By: [email or web address]  Left voicemail reminding patient of appointment on Monday, June 29th. Provided B[facility] number for appointment confirmation.  ____________________________________________________________________________________    Date: 11/07/2013  Type: Action  Entry By: [email or web address]  Revisit Scheduled; left V/M.  Letter sent.Patient is put on waitlist.  ____________________________________________________________________________________    Date: 11/07/2013  Type: Chart in review  Entry By: morantes  Updates completed, may schedule for revisits Neph(Vanessa) & Surgeon.  ____________________________________________________________________________________    Date:  10/01/2013  Type: Action  Entry By: Mardi Mainland w/ pt, scheduled Stress, Echo & CXR 10/26/13. Reminder letter mailed and emailed.   ____________________________________________________________________________________    Date: 10/01/2013  Type: Action  Entry By: Vonda Antigua for pt to call to schedule testing.   ____________________________________________________________________________________    Date: 09/26/2013  Type: Action  Entry By: Vonda Antigua for pt to call to schedule remaining testing.   ____________________________________________________________________________________    Date: 09/25/2013  Type: Action  Entry By: Vista Mink  Pended orders for stress/echo/cxr.   ____________________________________________________________________________________    Date: 09/25/2013  Type: Action  Entry By: Carloyn Manner  Per Audrey. Benjamine Sprague, vasculars OK, no CT KUB needed.  ____________________________________________________________________________________    Date: 09/25/2013  Type: Action  Entry By: Carloyn Manner  Informed Audrey. Peggye Pitt office, pt will need updates & revisits. They will fax current note.  ____________________________________________________________________________________    Date: 09/25/2013  Type: Action  Entry By: Carloyn Manner  WU is complete, chart to [facility] for financial clearance. Noting from last financial note, pt will need couseling on Supplemental insurance.   ____________________________________________________________________________________    Date: 09/25/2013  Type: Action  Entry By: Vista Mink  To Janette: Dental clearance and 1st Hep A vaccination done. Pt wants to know if she is ready to be listed.   ____________________________________________________________________________________    Date: 09/20/2013  Type: Action  Entry By: Vista Mink  Scheduled pt, via email, for Hep A vaccination on 09/25/13 at 12pm. Dan notified. Letter emailed.    ____________________________________________________________________________________    Date: 09/19/2013  Type: Action  Entry By: Vista Mink  Dental clearance received.   ____________________________________________________________________________________    Date: 09/03/2013  Type: Action  Entry By: Vonda Antigua for pt to check on status of dental clearance.   ____________________________________________________________________________________    Date: 08/07/2013  Type: Action  Entry By: Vista Mink  VM from pt. She has dental appts on 2/4 and 2/20. Expects dental clearance to happen at the end of February. Will follow up in early March.  ____________________________________________________________________________________    Date: 08/06/2013  Type: Action  Entry By: Vonda Antigua for pt to call me back to discuss dental clearance.   ____________________________________________________________________________________    Date: 06/29/2013  Type: Action  Entry By: Carloyn Manner  I LVM for pt, does she have detal clearance?? Please let us know.  ____________________________________________________________________________________    Date: 05/11/2013  Type: Chart in review  Entry By: morantes  Awaiting Dental clearance.  ____________________________________________________________________________________    Date: 05/08/2013  Type: Email message  Entry By: Carloyn Manner  From: Venu Pillarisetty [[email or web address]   Sent: Monday, May 07, 2013 4:17 PM  To: Gaynelle Arabian RN  Cc: Sela Hua  Subject: Re: You have a message in [place] like our original recommendation was an Eovist MRI, but that would only be to differentiate adenoma from [facility].  Either way, based upon the report there is no concern for a lesion that would preclude her transplant.    Venu    ____________________________________________________________________________________    Date: 05/07/2013  Type: Action  Entry By: Carloyn Manner  I reviewed remaining wu w/pt. We are  awaiting Audrey. Dellia Cloud review on recent CT. Otherwise Dental clearance is still pending. I faxed Hep A vacc request to Audrey. Danne Baxter.  ____________________________________________________________________________________    Date: 05/02/2013  Type: Action  Entry By: Carloyn Manner  I LVM for pt to please call me to review remaining wu(dental). Vaccinations.   ____________________________________________________________________________________    Date: 05/02/2013  Type: Email message  Entry By: Carloyn Manner  To Audrey. Dellia Cloud: Audrey. Dellia Cloud, This pt completed the 4 phase liver CT on 04/20/13 in f/u as recommended for liver lesions. Would you review please. Pt almost ready to list for kid/panc tx.  ____________________________________________________________________________________    Date: 04/06/2013  Type: Action  Entry By: Lawerance Cruel w/ pt and scheduled CT for 10/17. Reminder letter mailed. Pt has dentist appt same day.   ____________________________________________________________________________________    Date: 04/05/2013  Type: Action  Entry By: Vista Mink  LVM to discuss scheduling CT.   ____________________________________________________________________________________    Date: 04/02/2013  Type: Action  Entry By: Vista Mink  Faxed pt dental clearance form.   ____________________________________________________________________________________    Date: 03/30/2013  Type: Action  Entry By: Carloyn Manner  I LVM for pt, she will need CT to f/u liver lesions. Also we are awaiting her Dental clearance as final test to complete evaluation.  Jessica to schedule.   ____________________________________________________________________________________    Date: 03/30/2013  Type: Action  Entry By: Sloan Leiter to Janett Billow for 4 phase liver CT, see below, to be ordered by Audrey. Teressa Lower. Added to studies list  ____________________________________________________________________________________    Date: 03/30/2013  Type: Email  message  Entry By: Carloyn Manner  From: Venu Pillarisetty [[email or web address]   Sent: Friday, March 30, 2013 11:48 AM  To: Gaynelle Arabian RN  Cc: Sela Hua  Subject: Re: Testing    A 4 phase liver CT would be a reasonable alternative. It will not characterize the lesions any more, but would give an assessment on whether any have grown.    Thanks.    Venu    ____________________________________________________________________________________    Date: 03/30/2013  Type: Email message  Entry By: Carloyn Manner  Hello Audrey. Dellia Cloud,     I am forwarding this message to you for your advice. This pt was seen on 04/26/2012. You recommended some f/u testing. Unfortunately this pt is on dialysis and cannot have this particular contrast.  What would you recommend in place of the Eovist contrast enhanced MRI?  Janette  ____________________________________________________________________________________    Date: 03/22/2013  Type: Action  Entry By: Carloyn Manner  Per Audrey. Thayer Dallas, he requests we cancel MRI that is scheduled for tomorrow. Pt is on HD & contradicted to have GAD. Per review, Eovist contrast enhanced MRI recommended by Surgeon-Audrey. Vivia Budge Pillarisetty, to f/u on lesions in the liver. I will forward to Audrey. Teressa Lower for her recommendation/evaluation. To note CT abd w/wo contrast done 03/29/12 & consulted by tumor board 04/25/12.  ____________________________________________________________________________________    Date: 03/06/2013  Type: Action  Entry By: Carloyn Manner  Request for immunization testing faxed to outside providers.  ____________________________________________________________________________________    Date: 03/01/2013  Type: Action  Entry By: Val Riles  Received Pap (09/21/12) from [place].  ____________________________________________________________________________________    Date: 02/23/2013  Type: Action  Entry By: Vista Mink  Scheduled pt for vascular duplex  and abdominal MRI on 9/19 starting at  7:30a  ____________________________________________________________________________________    Date: 02/23/2013  Type: Action  Entry By: Vista Mink  Scheduled pt for vascular duplex and abdominal MRI on 9/19 starting at 7:30a  ____________________________________________________________________________________    Date: 02/21/2013  Type: Action  Entry By: Val Riles  Requested PAP records (3/14) from [facility] [place] through [facility].  ____________________________________________________________________________________    Date: 02/20/2013  Type: Action  Entry By: Vista Mink  Entered orders into epic. Spoke w/pt re MRI and medical devices, she says she has insulin pump. Told pt I would call her back when orders are signed to schedule. Pt had pap done in March 2014 at [facility]. To Loa Socks to request records through [employer].   ____________________________________________________________________________________    Date: 12/27/2012  Type: Action  Entry By: Carloyn Manner  I LVM for pt, need remaining testing, please call to review.  ____________________________________________________________________________________    Date: 12/27/2012  Type: Chart in review  Entry By: morantes  Pt still needs MRI & Vasculars. Forwarding to Margreta Journey to arrange.  ____________________________________________________________________________________    Date: 12/01/2012  Type: Action  Entry By: Carloyn Manner  Updated testing letter resent to pt, I LVM to pt informing of same.  ____________________________________________________________________________________    Date: 06/23/2012  Type: Action  Entry By: Carloyn Manner  I LVM for pt, wu almost complete, need vascular duplex studies. Will forward to Darl to arrange '@[place]' .   ____________________________________________________________________________________    Date: 06/12/2012  Type: Email message  Entry ByCarloyn Manner  Sent: Monday, June 12, 2012 11:18 AM  To: Kennon Holter  Cc: [email or web address]; [email or web address]  Subject:  RE: You have a message in [facility]    So I have been stalking my favorite liver radiologists without much success.  Regardless I reviewed the images with a radiologist today.  The multiple tiny lesions are too small to characterize and are all very peripheral-- I doubt we'll ever know what they are but they sure don't look like adenomas to me.  They are really tiny and I don't think should be of concern.  There is one larger lesion, 1.9 x 1.7cm, that could be an adenoma.  It would be great to get an MRI but not feasible with her kidneys (I assume).  The lesion definitely does not wash out.  It is in a terrible location for biopsy.  I think she should be fine for kidney transplant, but would obtain a 6 month f/u multiphase liver CT (due in March 2014) to make sure there is stability.    Thanks,  R    ____________________________________________________________________________________    Date: 06/12/2012  Type: Action  Entry By: Gabrielle Dare  see email from [place], repeat a 6 month f/u multiphase liver CT (due in March 2014). thanks  ____________________________________________________________________________________    Date: 06/09/2012  Type: Financial  Entry By: [employer] verified, pt fin cleared, wu eval and tx listing. No PA required.  ____________________________________________________________________________________    Date: 06/08/2012  Type: Referral  Entry By: Cherylin Mylar,  can you reciew this lady's liver cysts, hepatic adenomas. She is otherwise cleared for kidney txp. Alden Benjamin asked for your opinion!  thanks,  Niamh  ____________________________________________________________________________________    Date: 05/31/2012  Type: Medical info  Entry By: carp  Audrey. Teressa Lower asking Audrey. Red Christians to review the CT scan.   ____________________________________________________________________________________    Date: 05/24/2012  Type: Action  Entry By: [email or web address]  Scheduled patient for remaining portion of team w/  surgeon visit for patient on 05/30/12 at 11:15am (already  saw dietician). Sending out a confirmation letter.   ____________________________________________________________________________________    Date: 05/23/2012  Type: Chart in review  Entry By: [facility] with MSW at kidney center.  Doesn't know pt well but recognizes that she has a lot of stressors in her life - working f/t, dialysis, raising a small child on her own.  Some dietary compliance issues.  No apparent mental health issues.  Appears to have many difficulties managing her many life commitments.  This will be an issues for tx and will need to be addressed when rescheduled.  ____________________________________________________________________________________    Date: 05/23/2012  Type: Action  Entry By: Carloyn Manner  To Audrey Bullock to talk to [facility]. Understanding pt has many current stressors with work & childcare.   ____________________________________________________________________________________    Date: 05/23/2012  Type: Action  Entry By: Carloyn Manner  Unfortunately pt brought her toddler son with her. 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 Able to briefly see Dietician. I cancelled remaining appt's. To reschedule again, this time encouraged her to leave son w/cargivers.   ____________________________________________________________________________________    Date: 05/23/2012  Type: Action  Entry By: Kandis Fantasia  Pt still not here at 9:20 am, called cell, she said she was just arriving and was late due to traffic. I advised her to use valet parking to expedite and gave verbal directions to [facility]. Updated Priscilla/Carol.  ____________________________________________________________________________________    Date: 05/22/2012  Type: Action  Entry By: [email or web address]  Patient no showed eval today. Said she was under the impression that her eval was scheduled for 05/23/12 although letter mailed to her indicates otherwise.  Rescheduled her to tomorrow, 05/23/12, at 9am instead.   ____________________________________________________________________________________    Date: 05/19/2012  Type: Action  Entry By: daryljp  L/M for patient to call back and indicate if she can start at [facility] for team w/ surgeon eval Monday.  ____________________________________________________________________________________    Date: 05/11/2012  Type: Action  Entry By: daryljp  Scheduled patient for team w/ surgeon eval on Monday 05/22/12 at 9:45am with team.   ____________________________________________________________________________________    Date: 05/10/2012  Type: Chart in review  Entry By: Carloyn Manner  To Daryl to schedule  Surgeon & Team visits. Thank you.  ____________________________________________________________________________________    Date: 05/03/2012  Type: Email message  Entry By: Benjamine Sprague to proceed,  Thanks  N    ____________________________________________________________________________________    Date: 05/03/2012  Type: Chart in review  Entry By: morantes  Hepatology 04/25/12 note reviewed: F/u on unifocal lesions that are adenomas vs. FNH vs. hemangiomas. Noted as no evident risk factors for cancer. Follow-up in 6 months with an MRI of the liver with Eovist. cleared to continue w/u. WIll forward to Audrey Gray Bernhardt.  OK to schedule Surgeon & Team visits, will forward to Daryl.  ____________________________________________________________________________________    Date: 04/06/2012  Type: Action  Entry By: Gabrielle Dare  spoke with Rojelio Brenner, explained need for hepatology review. She understands.  ____________________________________________________________________________________    Date: 04/05/2012  Type: Action  Entry By: Bland Span  Delivered to ONC.  ____________________________________________________________________________________    Date: 04/05/2012  Type: Referral  Entry By: Bland Span  Processed 1st liver referral, patient notified and confirmation  sent.  ____________________________________________________________________________________    Date: 04/04/2012  Type: Chart in review  Entry By: carp  Call back from [place] - she is very concerned about needing more testing for the liver issues. Consult sent - will ask Audrey. Gray Bernhardt to call her and explain in more detail  the findings.   ____________________________________________________________________________________    Date: 04/04/2012  Type: Action  Entry By: [email or web address]  Created referral to [facility] in EPIC for patient.  ____________________________________________________________________________________    Date: 04/04/2012  Type: Medical info  Entry By: carp  Per Audrey. Gray Bernhardt, needs hepatology consult. Message left for Carriann at work and on her cell. To Daryl to send hepatology consult: "34 yr old female with ESRD, Type 1 DM, work up for [place] transplant. 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 Audrey. Gray Bernhardt requesting hepatology consult for followup."  ____________________________________________________________________________________    Date: 03/29/2012  Type: Chart in review  Entry By: carp  CT to Audrey. Gray Bernhardt for review.  ____________________________________________________________________________________    Date: 03/22/2012  Type: Action  Entry By: [email or web address]  Call from patient asking to reschedule her CT from Thursday to another date, said she double booked herself. Rescheduled her to 03/29/12 at 9am. L/M on her voicemail to notify her and will send her a confirmation letter.   ____________________________________________________________________________________    Date: 03/21/2012  Type: Action  Entry By: [email or web address]  Correction to below notation, CT scheduled for 03/23/12. Called and informed patient of this via voicemail, asked her to call and confirm ability to attend.  ____________________________________________________________________________________    Date:  03/21/2012  Type: Action  Entry By: [email or web address]  Arranged Liver CT on 04/22/12 at 9:15am for patient. Sending her a testing letter.   ____________________________________________________________________________________    Date: 03/16/2012  Type: Action  Entry By: [email or web address]  L/M for patient to call and schedule CT.   ____________________________________________________________________________________    Date: 03/14/2012  Type: Chart in review  Entry By: carp  Requisition completed by Audrey. Gray Bernhardt.  Message left for Nancy for the need of additional testing. To Daryl to arrange.  ____________________________________________________________________________________    Date: 03/13/2012  Type: Medical info  Entry By: carp  Per Audrey. Gray Bernhardt - needs CT of the liver to further evaluate the liver. Requisition  forwarded, ? CT abdomen/pelvis with contrast/4 phase CT - liver protocol?  ____________________________________________________________________________________    Date: 03/09/2012  Type: Chart in review  Entry By: carp  Chart to Audrey. Gray Bernhardt to review the abdominal US.  ____________________________________________________________________________________    Date: 02/11/2012  Type: Action  Entry By: Kandis Fantasia  Workup scheduled 8/29: USAB at 7:45am, stress at 9:30am, echo at 2:40, CXR walkin. LM for pt, reminder letter sent.  ____________________________________________________________________________________    Date: 02/02/2012  Type: Action  Entry By: Kandis Fantasia  Msg from pt asking to discuss workup, called back work # as requested but had to l/m.  ____________________________________________________________________________________    Date: 01/18/2012  Type: Financial  Entry By: [employer] verified, pt fin cleared, wu eval and tx listing. No PA required.  ____________________________________________________________________________________    Date: 01/18/2012  Type: Chart in review  Entry By: carp  OK to arrange the work up here when  cleared by [facility].  ____________________________________________________________________________________    Date: 01/13/2012  Type: Action  Entry By: [email or web address]  Per patient, she missed yesterdays eval because she was under the impression her appt was the next day. Says she swears I told her that in our call. Reviewed letter in [place] indicates otherwise. As does letter sent by Audrey Sciara to remind her. I also called 2 days prior to remind her. Audrey. Gray Bernhardt told her that she can still be seen but that she must come in for soonest available, not today. Scheduled patient for 01/26/12 at 1:45pm with Audrey. Thayer Dallas. Asked  that she repeat date and time back to me after writing it down. Also will send her a confirmation letter with that information.  ____________________________________________________________________________________    Date: 01/13/2012  Type: Chart in review  Entry By: carp  Dorrie arrived for clinic today, states that when she spoke with Daryl and arranged her appt that she specifically discussed the date of the appt as 01/13/12.  She did not open the letter because of the phone conversation. Apologized for the conflicting information. Assured her that we will rearrange the appt and will clear with Audrey. Gray Bernhardt that we can arrange a full neph/team visit. To Daryl to arrange a Team/neph visit with Audrey. Gray Bernhardt.  ____________________________________________________________________________________    Date: 01/10/2012  Type: Action  Entry By: [email or web address]  L/M for patient to remind of appt Wednesday.  ____________________________________________________________________________________    Date: 01/04/2012  Type: Action  Entry By: [email or web address]  Mailed appt reminder to pt.  ____________________________________________________________________________________    Date: 12/27/2011  Type: Action  Entry By: Burna Forts with patient and rescheduled her for 01/12/12 at 1:15pm with Audrey. Gray Bernhardt. Asked her if she was sure she'd be able to make it  this time and she assured me she would. Sending out another confirmation letter.   ____________________________________________________________________________________    Date: 12/27/2011  Type: Action  Entry By: [email or web address]  Called patient to try and reschedule initial eval, but had to L/M. Note that this will mark the 3rd time she has canceled her initial eval for the same reason, does not arrange coverage at work although evals are scheduled practically months in advance.   ____________________________________________________________________________________    Date: 12/24/2011  Type: Action  Entry By: Kandis Fantasia  Msg for [place] from, states unable to keep 7/3 neph only appt due to no coverage at work. She apologized and said she thought it was 8/3. Requests the soonest available reschedule. To Daryl to follow up.  ____________________________________________________________________________________    Date: 11/23/2011  Type: Medical info  Entry By: Ashley Royalty  Spoke to pt to confirm tomorrow's appt, but pt says she can't make it as she has to close the office and can't find a co-worker to cover for her. Re-scheduled neph only appt for 01/05/12 at 1:15pm with Audrey. Gray Bernhardt. Also, added pt to waitlist for any day with >1 week notice. Mailed new appointment reminder to pt.  ____________________________________________________________________________________    Date: 11/22/2011  Type: Phone message  Entry By: [email or web address]  Called pt to confirm upcoming appt, LMTCB.  ____________________________________________________________________________________    Date: 11/15/2011  Type: Action  Entry By: [email or web address]  Mailed new appt reminder to pt.  ____________________________________________________________________________________    Date: 11/15/2011  Type: Medical info  Entry By: Ashley Royalty  Called pt to confirm upcoming appt this Wed. Pt states she cannot make it as she has not watched the DVD or requested time off from work as her living donor cannot  attend. Re-scheduled nepho only appt to 11/24/11 at 3pm with Audrey. Elie Goody. Encouraged pt to review packet.  ____________________________________________________________________________________    Date: 09/10/2011  Type: Action  Entry By: [email or web address]  Sending out a new patient packet.   ____________________________________________________________________________________    Date: 09/09/2011  Type: Action  Entry By: [email or web address]  Requested Medicare [id number] form from Dor[facility] through [employer].  ____________________________________________________________________________________    Date: 09/09/2011  Type: Medical info  Entry By: Bradd Burner to pt and confirmed she has Estevan Ryder so she is cleared for nephro only appt but would need further financial review prior to full team visit.  Scheduled initial appt for 11/17/11 at 1pm with Audrey. Gray Bernhardt.  ____________________________________________________________________________________    Date: 09/06/2011  Type: Action  Entry By: [email or web address]  Correction: referral is for kidney-pancreas.  ____________________________________________________________________________________    Date: 09/06/2011  Type: Referral  Entry By: [id number]  Processed first kidney transplant referral. Fax notification sent to ref MD. Initial call attempted, LMTCB.

## 2019-09-29 NOTE — Historical Transplant Studies (Signed)
Torey, Regan [phone])  Liver Pre-Transplant Studies  Referral Date: 04/04/2012  Status: Inactive  ____________________________________________________________________________________    Primary Diagnosis:       ABO:      Family History:      Education/Employment History: Attended College/Technical School Disability     History of Present Illness:     Past Medical History:     Medication History:     Alcohol History:     Tobacco History:      Recreational Drug History:

## 2019-10-23 ENCOUNTER — Telehealth (HOSPITAL_BASED_OUTPATIENT_CLINIC_OR_DEPARTMENT_OTHER): Payer: Self-pay

## 2019-10-23 ENCOUNTER — Other Ambulatory Visit (HOSPITAL_BASED_OUTPATIENT_CLINIC_OR_DEPARTMENT_OTHER): Payer: Self-pay

## 2019-10-23 DIAGNOSIS — N186 End stage renal disease: Secondary | ICD-10-CM

## 2019-10-23 NOTE — Telephone Encounter (Signed)
Processed 2nd kidney-pancreas transplant referral. Chart to RN for initial review.

## 2019-10-24 ENCOUNTER — Encounter (HOSPITAL_BASED_OUTPATIENT_CLINIC_OR_DEPARTMENT_OTHER): Payer: Self-pay

## 2019-10-31 ENCOUNTER — Encounter (HOSPITAL_COMMUNITY): Payer: Self-pay

## 2019-11-12 ENCOUNTER — Other Ambulatory Visit (HOSPITAL_COMMUNITY): Payer: Self-pay

## 2019-11-12 ENCOUNTER — Telehealth (HOSPITAL_BASED_OUTPATIENT_CLINIC_OR_DEPARTMENT_OTHER): Payer: Self-pay

## 2019-11-12 NOTE — Telephone Encounter (Signed)
[place] abd 06/06/19 images from [facility] nominatd to [employer]

## 2019-11-13 ENCOUNTER — Other Ambulatory Visit (HOSPITAL_COMMUNITY): Payer: Self-pay

## 2019-11-15 ENCOUNTER — Ambulatory Visit: Payer: Medicare Other

## 2019-11-20 ENCOUNTER — Telehealth (HOSPITAL_BASED_OUTPATIENT_CLINIC_OR_DEPARTMENT_OTHER): Payer: Self-pay

## 2019-11-20 NOTE — Telephone Encounter (Signed)
TC with pt and scheduled New neph appt on 12/18/19. Appt notification letter mailed to pt.

## 2019-11-21 NOTE — Telephone Encounter (Signed)
[place] abd 06/06/19 images are in pacs. Notified RN.

## 2019-11-27 ENCOUNTER — Ambulatory Visit: Payer: Medicare Other

## 2019-11-27 ENCOUNTER — Telehealth (HOSPITAL_BASED_OUTPATIENT_CLINIC_OR_DEPARTMENT_OTHER): Payer: Self-pay

## 2019-11-27 ENCOUNTER — Other Ambulatory Visit (HOSPITAL_COMMUNITY): Payer: Self-pay

## 2019-11-27 NOTE — Telephone Encounter (Signed)
req'd ct abd/pel 11/27/19, [place] abd 11/15/19 images from [facility] via fax and nominated to [employer]

## 2019-12-05 ENCOUNTER — Encounter (HOSPITAL_BASED_OUTPATIENT_CLINIC_OR_DEPARTMENT_OTHER): Payer: Self-pay

## 2019-12-10 NOTE — Telephone Encounter (Signed)
ct abd/pel 11/27/19, [place] abd 11/15/19 images are in pacs. Notified RN.

## 2019-12-18 ENCOUNTER — Ambulatory Visit: Payer: Medicare Other | Attending: Internal Medicine | Admitting: Internal Medicine

## 2019-12-18 VITALS — BP 133/63 | HR 62 | Temp 98.2°F | Ht 66.14 in | Wt 141.8 lb

## 2019-12-18 DIAGNOSIS — Z01818 Encounter for other preprocedural examination: Secondary | ICD-10-CM | POA: Insufficient documentation

## 2019-12-18 DIAGNOSIS — Z992 Dependence on renal dialysis: Secondary | ICD-10-CM | POA: Insufficient documentation

## 2019-12-18 DIAGNOSIS — N186 End stage renal disease: Secondary | ICD-10-CM | POA: Insufficient documentation

## 2019-12-18 NOTE — Progress Notes (Signed)
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  The patient has type 1 diabetes.  She has been on dialysis since 2012.    REFERRING PHYSICIAN:  Dr. Devin Going.    ACCOMPANIED BY:  Patient is accompanied by her caregiver and aunt Audrey Bullock.     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      In 2009 the patient was first made aware of decreased kidney function.  Around that time, she was also diagnosed with mild hypertension.  In 2010 while she was pregnant with her son, she was told that she had some progression of kidney dysfunction.  That pregnancy was complicated by preeclampsia.  In 2012, patient started hemodialysis.  Her current access is left arm AV fistula.  She no longer makes urine.  She denies any recurrent urinary tract infections or recurrent kidney stones.  Her dry weight is 63.5 kg.  She reports that she tolerates dialysis well and her blood pressure is overall in good control.  She is compliant with dialysis and denies cutting time short or missing dialysis sessions.    Of note, patient was initially evaluated for kidney-pancreas transplantation here at [facility] in 2015.  Her status was approved pending completion of workup around 2017  (will need to verify).  However, the patient moved to [place] and she did not have any capacity to fly back to [facility] and did not have any care plan to stay in [place] should she get transplanted here at that time so her chart was closed.  The patient reported that she might have started a workup at [facility] while in [place], but  she is unsure what her status is at [facility]. (The patient moved to [place] in 2016-2017, moved back to [place] in 2017 when she had a complicated course with a CVA secondary to PRES (hospoitalized in [place]).  When she had recovered, she moved back to [place] and moved back to [facility] 05/2019.  Since 05/2019, patient has been living in [place], [place], with her son, her aunt Audrey Bullock, [place] husband and son.    As for the patient's history of diabetes, she was diagnosed with type 1 diabetes when she was 34 years old.  Dr. Shirlee Limerick follows her diabetes.  She takes Lantus 18 units in the evening and she takes Humalog 5 units with sliding scale premeal.  She reports that her hemoglobin A1C is  at goal.  I reviewed the notes from her endocrinologist from 08/2019.  There was  mention of wide fluctuation of blood glucose from 30s-400s.  Patient reports that she currently has a Dexcom for about 1 month now, and she will meet up with Endocrinology this month.  There was also mention in the note that patient did not adjust the insulin regimen as advised.  The patient has diabetes complications of retinopathy and gastroparesis.  Her last nausea and vomiting/gastroparesis flare was more than 2 years ago.  She currently takes Reglan daily.  She denies neuropathy, amputation, or lower extremity ulcers.  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    Regarding her cardiovascular history, the patient denies any CAD or ACSs.  Her mobility is limited by her visual impairment.  She is able to ambulate around the grocery store for 30 minutes without any chest pain or shortness of breath.    As for her history of CVA secondary to PRES, she presented at [facility] in [place], [place] 11/2015.  She had a protracted hospitalization for CVA/PRES resulting to some left more than  right-sided weakness and also blindness. Her muscle strength has since recovered and she denies any residual deficits. Unfortunately, she did not recover her sight.  She is able to ambulate with the guidance or use of a walker.  She is blind and can only see shadows.  When asked how she manages her medications with her visual impairment, patient stated that she knows how many medications she needs to take and she has the medication bottles lined up and she is able to identify them by feel.  Also, Audrey Bullock is present during the times when she needs to take the medications and they remind each other regarding taking the medications and which medications to take.    REVIEW OF SYSTEMS:  Complete review of systems done and were negative except those mentioned in the HPI    PAST MEDICAL HISTORY:  1.  ESRD, biopsy-proven diabetic nephropathy.  2.  Type 1 diabetes diagnosed when she was 34 years old, complicated by retinopathy and gastroparesis.  3.  Hypertension diagnosed when she was 34 years old.  4.  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  She has had several imaging for this in the past and reportedly stable.  5.  CVA PRES in 11/2015.  6.  Legal blindness.  7.  Left ankle fracture status post ORIF 04/2016.  8.  Cesarean section 2011.  9.  G2, P1, 1  miscarriage and 1 preterm delivery in 2011.    She reports history of:  1.  Cancer:  No.  2.  Skin cancer:  No.  3.  Seizure:  Yes.  Patient is currently on [place].  4.  Lung disease:  No.  5.  Tuberculosis or positive PPD:  No.  6.  Peptic ulcer disease:  No.  7.  Hepatitis:  No.  8.  Gallstones or cholecystitis:  No.  9.  Parasitic infections:  No.    FAMILY MEDICAL HISTORY:  1.  Positive kidney disease, maternal grandmother with diabetes.  2.  Positive diabetes, mother and maternal grandmother.  3.  Negative CAD.  4.  Negative colon cancer.  5.  Negative breast cancer.    SOCIAL HISTORY:  Smoking:  Negative.  Alcohol:  Negative.  Drugs:  Negative.  Occupation:  The patient is  currently unemployed.  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    DONORS:  No known donors.  The patient has not really explored this as she was hoping to get a kidney-pancreas transplantation.    PRIMARY CAREGIVER:  Her aunt Audrey Bullock who was present during this visit will be her primary caregiver.  Currently, Audrey Bullock is a paid caregiver through the [facility] per patient report.    ALLERGIES:  NO KNOWN DRUG ALLERGIES.    MEDICATIONS:  I reviewed the medications that they have listed on the paper.  1.  Sevelamer 1600 day three times daily with meals.  2.  Hydralazine 50 mg three times a day.  3.  Amlodipine 10 mg  daily.  4.  Keppra 500 mg daily.  5.  Carvedilol 6.25 mg two times a day.  6.  Rosuvastatin 40 mg daily.      PHYSICAL EXAMINATION:  VITAL SIGNS: Blood pressure 133/63.  Pulse rate 62.  Temperature 36.8.  O2 saturation 95% on room air.  Weight 64.3 kilograms (141 pounds and 12.1 ounces.  Height of 5'6".  BMI 22.78.  GENERAL:  Not in acute distress, comfortable, sitting in chair.  HEENT:  Anicteric sclerae. No ear discharge.  NECK:  No cervical lymphadenopathy, no carotid bruit.  CHEST:  Clear breath sounds, no rhonchi, no crackles.  CARDIOVASCULAR:  Positive holosystolic murmur (likely due to fistula).  Negative rub.  Negative gallop.  ABDOMEN:  Soft, nontender.  EXTREMITIES:  No lower extremity edema.  No foot ulcer.  Pulses over femoral, posterior tibial and dorsalis pedis are full and equal.  DIALYSIS ACCESS:  Left AV fistula aneurysmal.  No thinning.  Has good thrill and good bruit.  SKIN:  No wound, no rash.  NEUROLOGIC:  A&O x4.  Able to see shadows.  Unable to see any definite image.    PSYCHIATRIC:  Mood and affect appropriate.  Occasionally, note antagonistic behavior.    PERTINENT DATA, LABS AND IMAGING:  CARDIAC:  No recent cardiac studies.  Last one was from 2017.  Myocardial perfusion scan 07/09/2015.  Normal myocardial perfusion study.  Echocardiogram 07/09/2015.     There is concentric left ventricular hypertrophy with normal chamber size and wall motion.  Ejection fraction normal, estimated to be 60% or more.  Right ventricular size and free wall motion are normal.  Left atrium is enlarged.  Right atrial size is normal.  The aortic root and arch appeared normal within the field of view.  The aortic valve has 3 cusps.  There is thickening and focally echogenicity consistent with calcification and/or fibrosis.  Systolic opening is reduced, but there is no evidence of significant stenosis or insufficiency.  The mitral valve leaflets are thin and normally mobile with no evidence of true prolapse.  Doppler evaluation demonstrates normal systolic, diastolic flow.  The tricuspid valve appears normal.  The pulmonary artery pressure is high normal.  The pericardium is normal with no evidence of pericardial effusion or thickening.    CT abdomen and pelvis 11/27/2019.  1.  Atrophic native kidneys consistent with history of end-stage renal disease.    2.  Redemonstration of left renal cyst.  The largest at the lower pole and measures 2.2 cm and is stable in size.  Common although the internal septum seen on prior ultrasound is not visible on this exam.  Vascular moderate bilateral external iliac artery calcification intermittently circumferentially.  Mild calcification along the posteromedial margin of both common iliac arteries, noncircumferential.  Abdominal aorta is largely free of mural calcification.  The small branch vessels are diffusely calcified.    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  She has been on hemodialysis since 2012.  Her understanding of the transplantation process is fair.  The motivation to undergo the transplantation process is good.    We discussed the risks and benefits of kidney/pancreas  transplantation, including surgical and immunosuppression risk, cardiovascular events or even death, and Ms. Pardy wishes to proceed further with the evaluation process.  Surgical risks include cardiovascular, peripheral vascular, pulmonary and neurologic complications.  Immunosuppression risks include rejection, infection,  malignancy, diabetes, weight gain, and neurologic complications.  Risk of recurrent disease was also discussed.    The benefits of renal transplantation over maintenance dialysis were explained.  The patient is aware that receiving a living donor kidney represents the best option in relation to superior transplantation outcomes and allows for shorter waiting times.  At this time, Ms. Diskin no potential living donors.  I encouraged her that should she have potential living donors to contact our Living Donor Program for further information and discussion of candidacy.  I briefly mentioned to her that on initial review of her blood vessels, pancreas transplant is unlikely.  I encouraged her to talk with family and friends regarding kidney transplant living donation.    Ms. Bazinet case will be discussed by the transplant team in our weekly transplant meeting and a decision regarding candidacy, further workup and listing will be made at that time.  I will recommend Ms. Ritchie as being a reasonable candidate to continue with a kidney transplant workup.    As for her vascular status, I would like the surgeons to review her recent CT and assess if the pancreas is reasonable.  On initial review, it seems that her vascular status is challenging for kidney and pancreas transplantation and it would seem that the pancreas will not be doable given her vascular status.  We will defer to the surgeon's evaluation of this.    From a cardiovascular pretransplant evaluation perspective, I recommend further evaluation with stress test and echocardiogram.    From a malignancy screening perspective, recommend  further evaluation with age-appropriate cancer screening.    From an infectious screening risk evaluation perspective, recommend routine serological labs and vaccinations.    From a care plan standpoint, given challenges in the past, specific attention should be made regarding a definitive care plan. I think they will need a better system of medication administration with more involvement from her caregiver.    Thank you for the opportunity to evaluate this nice patient.

## 2019-12-18 NOTE — Progress Notes (Deleted)
Dictation #1  VUF:[id number]  [id number]:[id number]

## 2019-12-20 ENCOUNTER — Telehealth (HOSPITAL_BASED_OUTPATIENT_CLINIC_OR_DEPARTMENT_OTHER): Payer: Self-pay

## 2019-12-20 ENCOUNTER — Encounter (HOSPITAL_BASED_OUTPATIENT_CLINIC_OR_DEPARTMENT_OTHER): Payer: Self-pay

## 2019-12-20 NOTE — Committee Review (Signed)
Committee Review Note     Evaluation Date: 12/20/2019  Committee Review Date: 12/20/2019    Organ being evaluated for: Kidney    Transplant Phase: Evaluation  Transplant Status: Active    Transplant Coordinator: Hulan Saas    Referring Physician: Corrie Mckusick    Primary Diagnosis: Diabetes Mellitus - Type I    Committee Review Members:  Collings[place], RD   Financial Coordinator Shirley Friar, [place], PSS/PSR   Nephrology Michaell Cowing, ARNP, Iris Fransisco Hertz, MD, Kathlene November, ARNP, Winnifred Friar, MD, Lucas Mallow, MD   Program Coordinator Koleen Distance, Coordinator, Bartolo Darter, Coordinator   Social Work  [name], LICSW, Thereasa Distance, MSW   Transplant Coordinator Jonette Eva, RN, Laymond Purser, RN, Henrine Screws, RN   Transplant Surgery Anastasia Fiedler, MD, Sela Hua, MD       Transplant Eligibility: End stage renal disease on dialysis    Committee Review Decision: Needs Re-presentation    Relative Contraindications: None    Absolute Contraindications: None    Committee Discussion Details:   Neph- from a Kidney standpoint she was ok to move forward. Pt was previously active with [place] but removed due to moving to [place] but moved back to [facility] for good. Not pancreas candidate , questionable Kidney.

## 2019-12-20 NOTE — Telephone Encounter (Signed)
ID evaluation form input in epic

## 2019-12-21 ENCOUNTER — Encounter (HOSPITAL_BASED_OUTPATIENT_CLINIC_OR_DEPARTMENT_OTHER): Payer: Self-pay

## 2019-12-25 ENCOUNTER — Telehealth (HOSPITAL_BASED_OUTPATIENT_CLINIC_OR_DEPARTMENT_OTHER): Payer: Self-pay

## 2019-12-25 NOTE — Telephone Encounter (Signed)
Notified pt that she is able to move forward with kidney eval only. She is declined for the pancreas eval due to severe PVD. She will complete her w/up via [facility]. Reminded to complete dental and pap smear.

## 2019-12-27 ENCOUNTER — Telehealth (HOSPITAL_BASED_OUTPATIENT_CLINIC_OR_DEPARTMENT_OTHER): Payer: Self-pay

## 2019-12-27 NOTE — Telephone Encounter (Signed)
Confirmed appointment and reviewed covid protocol with Alvenia.

## 2020-01-01 ENCOUNTER — Encounter (HOSPITAL_BASED_OUTPATIENT_CLINIC_OR_DEPARTMENT_OTHER): Payer: Self-pay | Admitting: Oral & Maxillofacial Surgery

## 2020-01-01 ENCOUNTER — Ambulatory Visit
Admit: 2020-01-01 | Discharge: 2020-01-01 | Disposition: A | Discharge status: Home / Self Care | Payer: Medicaid Other | Admitting: Oral & Maxillofacial Surgery

## 2020-01-01 VITALS — BP 132/87 | HR 65 | Temp 98.2°F | Resp 18 | Ht 66.0 in | Wt 142.1 lb

## 2020-01-01 DIAGNOSIS — K029 Dental caries, unspecified: Secondary | ICD-10-CM

## 2020-01-01 DIAGNOSIS — Z01818 Encounter for other preprocedural examination: Secondary | ICD-10-CM

## 2020-01-01 NOTE — Patient Instructions (Addendum)
You were seen in consultation for your tooth extraction. We will plan to extract tooth #3 with local anesthesia (numbing shots) on a Tuesday between your Monday and Wednesday dialysis appointments. We may give you an antibiotic afterwards. You will eat a soft food diet afterwards. You need a COVID test ~2 days before the procedure.       [facility] hours are Monday-Friday, 8:00-4:30 (closed weekends and holidays)  [address], [place], [place] [address]  We appreciate you choosing the [facility] service and physicians for your healthcare needs. Below are direct telephone numbers to various clinic staff, we encourage you to contact us directly for questions and concerns.    Oral and Maxillofacial Surgery Clinic:  -Patient Care Cordinators [facility]) are [place] [phone].   Please call them for any questions you may have about your surgery related scheduling questions / concerns).    Nursing Staff (direct numbers during clinic hours):    If you have clinical questions or concerns during the week-day please call:  -Nurse, mental health is Sherryl Manges confidential number is [phone]. (Hours are [phone] or 3:30 pm Monday-Friday)  -Nurse Shirlee Limerick confidential number is [phone]. (Hours are 0830-1630 or 4:30 pm Monday-Friday).    For questions after business hours, or on weekends/holidays. Call the [place] @ [phone] then press 1.  If you want to speak to your provider call [place] operator @ [phone] and request to speak to Oral Surgery Resident.     For pain medication;  Allow 48-72 hours for all refills.    Clinic Administration:   Matilde Bash: [phone]    Encourage you if you have email address to sign up for [place] is a faster way to access your medical information online!  Communicate with your care team between visits through secure  messages,  manage appointments online without having to call, and  receive test results and letters instantly instead of by mail. Telehealth video chat appointment can be set up if you have eCare.  Download the MyChart app for 24/7 access from your iOS or  Android mobile device.

## 2020-01-01 NOTE — Progress Notes (Signed)
ORAL AND MAXILLOFACIAL SURGERY CONSULTATION NOTE     CC/ID  34 year old female here for consultation regarding carious tooth #3    HPI  The patient was seen by [facility] for routine dental care and was referred for evaluation and management of tooth #3. She denies a history of swelling, drainage, severe, pain, or need for antibiotics. She reports having teeth extracted before without any significant complication. Today she denies fevers, chills, nausea, vomiting.     PAST MEDICAL HISTORY  1.  ESRD on hemodialysis via LUE fistula - MWF at [facility] dialysis  2.  Type 1 diabetes c/b retinopathy, gastroparesesis, ESRD  3.  Hypertension, previously uncontrolled c/b b/l blindness  4.  Blindness - can she shadows, light, dark  5.  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  CVA PRES in 11/2015.    PAST SURGICAL HISTORY  LUE fistula - no reported complications  Left ankle fracture status post ORIF 04/2016.  Cesarean section 2011.  Denies surgical or anesthetic complications    MEDICATIONS  1.  Sevelamer 1600 day three times daily with meals.  2.  Hydralazine 50 mg three times a day.  3.  Amlodipine 10 mg daily.  4.  Keppra 500 mg daily.  5.  Carvedilol 6.25 mg two times a day.  6.  Rosuvastatin 40 mg daily.      ALLERGIES  NKDA    SOCIAL HISTORY   reports that she has never smoked. She has never used smokeless tobacco. She reports that she does not drink alcohol or use drugs.  Lives in [place]  Unemployed    FAMILY HISTORY  Family History     Problem (# of Occurrences) Relation (Name,Age of Onset)    Diabetes (1) Maternal Grandmother    No Known Problems (2) Mother, Father    Renal Disease (1) Maternal Grandmother        REVIEW OF SYSTEMS  A 10 system review of systems was performed and negative unless otherwise stated in the HPI.     PHYSICAL EXAM  GEN: well appearing, no acute distress  HEENT: trachea at midline, MIO 2mm. Generalized bone loss of maxilla and mandible. Spacing and flaring of maxillary anterior  dentition. #3 with shadow consistent with caries, not tender to palpation or percussion. No purulent drainage or surrounding swelling. No FOM edema.   RESP: unlabored on ambient air, no wheezing, rhonchi, crackles  CV: well perfused, no m/r/g, LUE fistula with thrill  GI: soft, nondistended  MSK: symmetric tone, ambulating without difficulty with friend  NEURO: alert and oriented  PSYCH: appropriate    IMAGING  Panoramic radiograph taken today shows carious tooth #3, generalized periodontal bone loss, left maxillary molar with severe mesial bone loss. No other obvious maxillary or mandibular pathology    ASSESSMENT AND PLAN  34 year old female, DM1 c/b ESRD on HD MWF in initial stages of transplant list, Blindness, HTN with carious and nonrestorable tooth #3 indicated for extraction     #Caries #3  - Extraction #3 with Local as able  - COVID test before  - Possible antibiotics after  - Discuss r/b/a including delayed healing in s/o DM1. Consent on day of procedure  - EXT on T or Th given MWF dialysis    PLAN: Ext #3 with LA    Dr. Lavone Neri agrees with assessment, plan and proposed treatment.     Monia Sabal Isabella Stalling DDS, MD  Resident, Oral and [facility]  I did not personally see the patient, but I have reviewed her history and findings with the resident. I agree with Dr. Curley Spice assessment and plan.    Oneida Alar, DMD, MD  Attending, Oral & Maxillofacial Surgery

## 2020-01-02 NOTE — Addendum Note (Signed)
Encounter addended by: Oneida Alar, MD on: 01/02/2020 12:08 PM   Actions taken: Clinical Note Signed, Visit diagnoses modified

## 2020-01-09 ENCOUNTER — Telehealth (HOSPITAL_BASED_OUTPATIENT_CLINIC_OR_DEPARTMENT_OTHER): Payer: Self-pay

## 2020-01-09 NOTE — Telephone Encounter (Signed)
Confirmed covid and procedure appointments with Aubreanna.

## 2020-01-13 ENCOUNTER — Ambulatory Visit: Payer: Medicare Other | Attending: Internal Medicine

## 2020-01-13 ENCOUNTER — Ambulatory Visit (HOSPITAL_COMMUNITY): Payer: Medicare Other

## 2020-01-13 DIAGNOSIS — Z01812 Encounter for preprocedural laboratory examination: Secondary | ICD-10-CM

## 2020-01-13 DIAGNOSIS — Z20822 Contact with and (suspected) exposure to covid-19: Secondary | ICD-10-CM | POA: Insufficient documentation

## 2020-01-13 LAB — COVID-19 CORONAVIRUS QUALITATIVE PCR: COVID-19 Coronavirus Qual PCR Result: NOT DETECTED

## 2020-01-13 NOTE — Progress Notes (Signed)
Patient was seen today at the [facility] COVID-19 testing site where a dual collection sample was taken from the anterior nares. The specimen was sent to the [facility] lab for COVID-19 testing. Results will be available within 48 hours. Patient received informational instructions on self-care. The specimen was collected by designated RN/LPN/MA per standing order.

## 2020-01-15 ENCOUNTER — Encounter (HOSPITAL_BASED_OUTPATIENT_CLINIC_OR_DEPARTMENT_OTHER): Payer: Self-pay | Admitting: Oral & Maxillofacial Surgery

## 2020-01-15 ENCOUNTER — Ambulatory Visit
Admit: 2020-01-15 | Discharge: 2020-01-15 | Disposition: A | Discharge status: Home / Self Care | Payer: Medicaid Other | Admitting: Oral & Maxillofacial Surgery

## 2020-01-15 VITALS — BP 154/77 | HR 66 | Temp 98.6°F | Resp 17 | Ht 66.0 in | Wt 143.6 lb

## 2020-01-15 DIAGNOSIS — K029 Dental caries, unspecified: Secondary | ICD-10-CM

## 2020-01-15 HISTORY — DX: Cerebral infarction, unspecified: I63.9

## 2020-01-15 HISTORY — DX: Essential (primary) hypertension: I10

## 2020-01-15 MED ORDER — OXYCODONE HCL 5 MG OR TABS
5.0000 mg | ORAL_TABLET | Freq: Four times a day (QID) | ORAL | 0 refills | Status: AC | PRN
Start: 2020-01-15 — End: ?

## 2020-01-15 MED ORDER — ACETAMINOPHEN 500 MG OR TABS
500.0000 mg | ORAL_TABLET | Freq: Four times a day (QID) | ORAL | 0 refills | Status: AC | PRN
Start: 2020-01-15 — End: ?

## 2020-01-15 MED ORDER — CHLORHEXIDINE GLUCONATE 0.12 % MT SOLN
15.0000 mL | Freq: Two times a day (BID) | OROMUCOSAL | 0 refills | Status: AC
Start: 2020-01-15 — End: ?

## 2020-01-15 NOTE — Progress Notes (Signed)
Time: 4:39 PM    Prior to Procedure:                 Allergy Profile :          has No Known Allergies.                                                              Vitals: Blood pressure (!) 154/77, pulse 66, temperature 37 C, temperature source Temporal, resp. rate 17, height 5\' 6"  (1.676 m), weight 65.1 kg (143 lb 9.6 oz), SpO2 97 %.                                                                               Allergies? (heparin, latex, chlorhexidi):  Done                           Anticipated pain & anxiety addressed: Done                           Consent form signed: Done                            Is patient on anticoagulation/coagulpoathic?: NO                          Coag results last 7 days: (last PTT/INR): NO                          Applicable test results available: Done                          Hands washed (clarify if not witnessed): YES                          Time Out/Final Verification:                             Active Time Out (all present & focused)?:YES                             Correct patient using 2 identifiers: YES                             Correct full name of Procedure: Extraction #3                             Correct Site/Side Marked:  R Side  Correct patient position:   Done                             Availability of special equip/meds/blood: N/A                             All syringes labeled with contents:  Done    PT monitoring                                                                EKG:  N/A                                 O2 Saturation:   YES                            VS q15 min during procedure:  N/A                                                           Pre-operative diagnosis: Dental caries, non-restorable tooth #3    Post-operative diagnosis: Same    Procedures: Extraction of tooth #3    Surgeon:  Gaetano Hawthorne, DMD, MD    Resident:  Wendi Maya, DDS    Anesthesia: Local anesthesia     Findings: Carious tooth #3    Estimated  Blood Loss: <82NF    Complications: None    Indication: 34 year old female presents for extraction of tooth #3. Potential risks of the procedure were explained to the patient including but not limited to pain, swelling, excessive bleeding, infection, maxillary sinus perforation, dry socket and potential damage to adjacent teeth and restoration. Questions were invited and answered. Informed consent was obtained.      Procedure in detail:  A time-out was performed.   Local anesthesia was achieved with:  2 carpules x 1.7cc of 2% Lidocaine with 1:100,000 epinephrine    An oral partition was used throughout the procedure, as was a bite block for mandibular stabilization.   Periosteal elevator was used to detach tissues. Small straight elevators used to luxate teeth. Tooth #3 was extracted using standard elevator and forceps technique. Extraction socket was thoroughly curetted and irrigated copiously with normal saline. Sharp bony edges were smoothed using periosteal elevator.     Gauze and pressure was applied for hemostasis. The patient tolerated the procedure well.    Post-operative instructions: Given both verbally and in written statements.    Post-operative medications:   Oxycodone 5mg  PO q7h PRN pain (6 tabs)  Acetaminophen 500mg  q8h PRN pain (50 tabs)  Peridex oral rinse 81ml swish and expectorate BID x 2 weeks    Attending was present in key portions throughout procedure.      Wendi Maya DDS  Resident, Oral and Maxillofacial Surgery    Attending: Dr. Lavone Neri    I have examined the patient, reviewed her history and findings. I agree  with Dr. Marylou Flesher note and the documented procedure.  I was immediately available for the duration of the procedure.    Oneida Alar, DMD, MD  Attending, Oral & Maxillofacial Surgery

## 2020-01-15 NOTE — Patient Instructions (Addendum)
[facility] hours are Monday-Friday, 8:00-4:30 (closed weekends and holidays)  [address] [place], [place] [address]  We appreciate you choosing the [facility] service and physicians for your healthcare needs. Below are direct telephone numbers to various clinic staff, we encourage you to contact us directly for questions and concerns.    Oral and Maxillofacial Surgery Clinic:  -Patient Care Cordinators [facility]) are [place] [phone].   Please call them for any questions you may have about your surgery related scheduling questions / concerns).    Nursing Staff (direct numbers during clinic hours):    If you have clinical questions or concerns during the week-day please call:  -Nurse, mental health is Sherryl Manges confidential number is [phone]. (Hours are [phone] or 3:30 pm Monday-Friday)  -Nurse Shirlee Limerick confidential number is [phone]. (Hours are 0830-1630 or 4:30 pm Monday-Friday).    For questions after business hours, or on weekends/holidays. Call the [facility] @ [phone] then press 1.  If you want to speak to your provider call  operator @ [phone] and request to speak to Oral Surgery Resident.     For pain medication;  Allow 48-72 hours for all refills.    Clinic Administration:   Matilde Bash: [phone]    Encourage you if you have email address to sign up for [facility] is a faster way to access your medical information online!  Communicate with your care team between visits through secure  messages, manage appointments online without having to call, and  receive test results and letters instantly instead of by mail. Telehealth video chat appointment can be set up if you have eCare.  Download the MyChart app for 24/7 access from your iOS or  Android mobile device.    Post-Operative Instructions for Dental Extractions    Day of  extraction    1. Keep your tongue out of the extraction areas.  2. To help control bleeding. Keep moist gauze pack firmly in place for the first hour. If you have a lot of bleeding, bite on a regular moist tea bag over the site. The tannic acid will help form a clot. Use constant pressure until the bleeding stops. Slight oozing of blood on the first day is normal. Some minor bleeding will be expected for at least two to four hours.  If bleeding continues beyond six hours, or if bleeding at any time seems excessive, please call the clinic.  3. The first 24 hours after the extraction:       Do not spit or rinse mouth       Do not smoke       Do not drink alcohol       Do not drink carbonated beverages       Do not drink hot liquids, since heat may increase swelling or bleeding       No suction activity (such as sucking through a straw) as it can dislodge the clot.  4. Following your extraction, you may have liquid food such as instant breakfast, milkshakes (no straws), warm soups, yogurt, vegetable juice, fruit juice, protein drinks and milk are good options    5. Get plenty of rest and eat well. Avoid vigorous physical activity for at least the first 24 hours.  6. Sit upright and relax for the rest of the day.  Sleep with your head elevated on a  few pillows the first night after surgery.  7. Apply an ice pack to your cheek approximately for 10 minutes at a time to reduce swelling. Take a break of at least 5 minutes between applications.     Take medicine as directed. Follow the instructions the surgeon and nurse discussed with you.  He or she may suggest using over-the-counter medicine during the day time. The narcotic is best taken at night, to help you sleep, only if you need it. You may also be prescribed antibiotics to prevent infection. Refer to the schedule set up for you at time of your sedation appointment.    THINGS TO EXPECT      SWELLING       There will be swelling for the first few days if the  extractions were difficult. If the swelling increases on day three to four days, call the clinic.       PAIN       After the anesthetic wears off, there may be some pain. Take the prescribed pain medications as directed by the surgeon and nurse. If the pain increases or continued for more than three days, call the clinic.  Do NOT take medication on an empty stomach.  Continuation of THINGS TO EXPECT  BLEEDING  Some minor bleeding will be expected for at least two to four hours.  If bleeding continues beyond six hours, or if bleeding at any time seems excessive, please call the clinic.        DAY AFTER EXTRACTIONS   Begin both mouth rinses with the prescribed mouth wash (Chlorhexidine  0.12%) rinse twice a day. Do not eat or drink 30 minutes after the rinse.   The salt water rinse is done after you eat. Use a half-teaspoon salt to 8 ounces of water. Rinse mouth after every meal and before bed.  To avoid dislodging the blood clot, brush your teeth as usual; avoiding the extraction site(s).  Take the medications prescribed by your doctor and follow the schedule the nurse gave you. If you have been given antibiotics, continue taking them as prescribed until they are all gone.        The healing process  Healing after wisdom teeth removal takes a few months. First, a blood clot forms in the socket where the wisdom tooth was removed. Within a day or 2, the socket starts filling with repair tissue. This lays the foundation for bone tissue to grow. When new bone tissue fills the socket, healing is complete.    Follow-up care  Follow up with your healthcare provider if you are not alert and back to your usual level of activity within 12 hours.    When to seek medical advice  Call your healthcare provider right away if any of these occur:   Drowsiness gets worse   Bleeding becomes hard to control or comes in spurts.   You have itching, a rash or other symptoms that may be due to an allergic reaction to you medicine.     The pain becomes more severe 10/10 or can't be controlled with pain medicine.    You have chills or a fever over 100.39F (38C).   Repeated vomiting.   Unable to urinate after 8 hours      You cannot be awakened of if you have any Shortness of Breath, problems swallowing, or unable to   wake up - seek medical attention immediately.  Call [facility] Oral Maxillofacial Surgery Triage Nurse Ivin Booty at [phone] Monday-Friday 13am-4:30pm  during clinic hours.- with any questions or concerns. After clinic hours call [place] Operator at   [phone] and ask to speak to OMS resident on call who is available 24/7.        Things to remember    Salt water rinses 1/2 tsp salt in 1 cup water - oral rinses after eating.Start next day & continue after surgery for at least a month.    Brew  black tea & ice for mouth pain- take big sip let sit in mouth for 30 seconds & swallow- Tannic acid in black tea helps with mouth pain & healing.  Please read over all handouts & call if you have any questions    Encourage you if you have email address to sign up for eCare                        Suggested to take Ibuprofen then in 3 hours take Tylenol then in 3 hours take ibuprofen then in 3 hours take Tylenol.  You will get pain relief if taken for 1st 5 days after procedure when you can expect most about of pain & swelling. Do not be alarmed if your swelling increases Post Day 3 or 4. This is your body's natural response to protect injured area. Swelling presses on nerve causing pain.  Goal is to reduce swelling  Steps to reduce swelling  1. Take Ibuprofen(anti inflammatory) around the clock until post op day 5  2. Do salt water rinses starting next day after procedure-every time you take pain medications or eat-Do salt water rinse. This will reduce pain & swelling  3. Ice face as much as possible 10 minutes on. Drink iced beverages. Heat will increase swelling.  Stay upright even when you sleep. Head on several pillows. If  you sleep flat, you may wake up with more swelling.

## 2020-01-16 ENCOUNTER — Telehealth (HOSPITAL_BASED_OUTPATIENT_CLINIC_OR_DEPARTMENT_OTHER): Payer: Self-pay | Admitting: Oral & Maxillofacial Surgery Res/Flw

## 2020-01-16 NOTE — Telephone Encounter (Signed)
CC/ID: Audrey Bullock is a 34 yo female with T1DM and ESRD s/p extraction of tooth #3 yesterday in the OM[facility] clinic.    S/O:  HPI - Phone call was made today at 9:20 AM  to check in on the patient and ensure that there has been no complications after she was dismissed from our clinic.Pt denies warmth, erythema or swelling of the face or any other signs of infection. No fevers, chills, vomiting or nausea. She says her pain is well controlled and she overall feels fine.    From OMFS consult note on 01/01/20:    PMH   1.  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  Type 1 diabetes c/b retinopathy, gastroparesesis, ESRD  3.  Hypertension, previously uncontrolled c/b b/l blindness  4.  Blindness - can she shadows, light, dark  5.  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  CVA PRES in 11/2015.    PAST SURGICAL HISTORY  LUE fistula - no reported complications  Left ankle fracture status post ORIF 04/2016.  Cesarean section 2011.  Denies surgical or anesthetic complications    MEDICATIONS  1.  Sevelamer 1600 day three times daily with meals.  2.  Hydralazine 50 mg three times a day.  3.  Amlodipine 10 mg daily.  4.  Keppra 500 mg daily.  5.  Carvedilol 6.25 mg two times a day.  6.  Rosuvastatin 40 mg daily.      ALLERGIES  NKDA    A/P:  34 yo female with ESRD on HD (MWF) and poorly controlled T1DM s/p extraction of tooth #3 in the OMFS clinic yesterday reporting no concerns or symptoms of infection.     Discussed with patient that is she develops fever, chills, nausea, vomiting, swelling or any other sign of infection, she should get in touch with our service immediately.     Questions were welcomed and answered. Call was terminated.

## 2020-01-17 NOTE — Addendum Note (Signed)
Encounter addended by: Oneida Alar, MD on: 01/17/2020 4:41 PM   Actions taken: Level of Service modified, Charge Capture section accepted, Clinical Note Signed

## 2020-01-21 ENCOUNTER — Telehealth (HOSPITAL_BASED_OUTPATIENT_CLINIC_OR_DEPARTMENT_OTHER): Payer: Self-pay

## 2020-01-21 NOTE — Telephone Encounter (Signed)
TC with pt and scheduled In clinic on 7/27. Appt notification letter mailed and emailed to pt.

## 2020-01-24 ENCOUNTER — Other Ambulatory Visit (HOSPITAL_BASED_OUTPATIENT_CLINIC_OR_DEPARTMENT_OTHER): Payer: Self-pay

## 2020-01-24 DIAGNOSIS — N186 End stage renal disease: Secondary | ICD-10-CM

## 2020-01-24 MED ORDER — PNEUMOCOCCAL 13-VAL CONJ VACC IM SUSP
0.5000 mL | Freq: Once | INTRAMUSCULAR | Status: AC
Start: 2020-01-29 — End: 2021-01-28

## 2020-01-24 MED ORDER — TETANUS-DIPHTH-ACELL PERTUSSIS 5-2-15.5 LF-MCG/0.5 IM SUSP
0.5000 mL | Freq: Once | INTRAMUSCULAR | Status: AC
Start: 2020-01-29 — End: 2021-01-28
  Filled 2020-01-24: qty 0.5

## 2020-01-25 ENCOUNTER — Telehealth (HOSPITAL_BASED_OUTPATIENT_CLINIC_OR_DEPARTMENT_OTHER): Payer: Self-pay

## 2020-01-25 NOTE — Telephone Encounter (Signed)
Spoke to pt. remind about the appointment on 01/29/20.

## 2020-01-28 NOTE — Progress Notes (Signed)
Date: 01/29/20  Referred by: Dr. 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  Pt was initially evaluated for kidney-pancreas transplantation in 2015, but moved to [place] and was unable to complete the workup.    Anthropometric Data:  Current Wt  Wt 146 lb 9.7 oz (66.5 kg) Current Ht  Ht 5' 5.748" (1.67 m)   BMI  Body mass index is 23.84 kg/m. Dry Wt  63.5 kg    UBW  63 kg % UBW 105%   IBW  59-65 kg  Wt for calculations  65 kg     Patient Interview/Subjective:    Weight history:   64.1 kg (08/24/19)  78 kg (07/07/17)  71.1 kg (03/27/17)  Dialysis:   Dialysis initiated 2012 and  is currently doing HD for 3 hour 15 minutes 3x weekly.    Location: [facility]  Urine output: none  Weight gain between sessions: 2 - 2.5 kg  Outpatient injections received: EPO. Vit D.     Anorexia: no    Taste Changes: none    Early satiety: none  GI symptoms: none   Food allergies: NKFA  Chewing/Swallowing/Dental issues: recent extraction of cariogenic tooth. Denies chewing difficulty.   Use of nutrition supplements: none   Vitamin, mineral, herbal Supplements: none   Diabetes management: Sees Dr. Shirlee Limerick, endocrinologist   Equipment used: Dexcom  Insulin use, type and requirements: Lantus 18 units in the evening and she takes Humalog 5 units with sliding scale premeal.  Home glucose monitoring: some hypoglycemia. Uses Hi-chew or juice to treat.   Estimated AIC: average BG 212 per dexcom.   (08/28/2019)  Hgb A1C, External 6.4   7.1 at most recent visit per pt reports, but unsure of date. Believes it was this month (July 2021).     Current diet:  Renal and RCS.   Number of meals and/or snacks/day: 3 meals one evening snack   Fluid Intake: doesn't measure (roughly 24 oz per day). Raspberry iced tea, water, zero sugar Gatorade occasionally.   Diet history:  B: 2 scrambled eggs, 2 sausages, english muffin with cream cheese.   On HD days, no breakfast.   L:Tuna sandwich with mayo and HB eggs - 1 batch makes 4 sandwiches.   D:fish, chicken, 1 cup rice,  Or mac n cheese, corn, green beans.  Snacks:1-2 apples in ranch   Meals consumed at restaurants, fast food/takeout: none  Alcohol intake: no  Smoking history: no     Recreational drug use:  no  Energy level:  good  Activity: no difficulty ambulating around grocery store. No intentional physical activity, pt reports "I've been lazy but I know I need to get back in shape"     Clinical Data:  Medical and Surgical History:  1.  ESRD, biopsy-proven diabetic nephropathy.  2.  Type 1 diabetes diagnosed when she was 34 years old, complicated by retinopathy and gastroparesis.  3.  Hypertension diagnosed when she was 34 years old.  4.  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  She has had several imaging for this in the past and reportedly stable.  5.  CVA PRES in  11/2015.  6.  Legal blindness.  7.  Left ankle fracture status post ORIF 04/2016.  8.  Cesarean section 2011.  9.  G2, P1, 1  miscarriage and 1 preterm delivery in 2011.  Labs:   Recent Labs     01/29/20  1520   SODIUM 139   POTASSIUM 4.5   PHOSPHATE 3.3   GLUCOSE 238 H   CREATININE 8.75 H   GFR 5 L   ALBUMIN 4.3   WBC 6.43   RBC 2.64 L   HEMOGLOBIN 7.9 L   HEMATOCRIT 25 L   AST 47 H   ALT 75 H   TRIGLYCERIDE 111   NONHDL 48   CHOLESTEROL 112   HDL 64   LDL 26        Nutrient Requirements:   Kcal  1950 - 2275 kcal (30-35 kcal/kg)   Gms Pro.  78 g (1.2 g/kg)   Gms HBV Pro.  59 g      Recommended intake: 2000 mg sodium, 2000 mg potassium, 1000 mg phosphorus     Nutrition Assessment:  Evaluation of intake: Diet history reflects that patient is meeting 100% estimated kcal  and protein needs on non-HD days and ~75% of needs on HD days d/t missed breakfast.    Physical Assessment:  Presence of abdominal adiposity: low  Muscle wasting: Not present  Subcutaneous fat loss: Not present  Ability to meet activity goals and nutrient requirements: yes    Barriers: vision impairment limits some physical activity.   Evaluation of nutrition status: moderate nutritional risk 2/2  disease process and co morbidities including hx of CVA, DM with retinopathy .  Labs reflect hyperglycemia, ESRD, anemia, historical a1c levels from earlier this year are less than 8.0%  Appropriate for transplant: yes  Demonstrated understanding, willingness and ability to improve nutritional and functional status: yes, pt needs encouragement to work on nutrition goals for BG control and physical activity    Education Provided:  Meeting Nutrient Requirements to Promote Anabolism  Renal Dietary Guidelines-focus on fluid restriction and low Na goals.   Use of Oral Supplements: not at this time.   Glucose control: hypoglycemia correction recommendations  Post Transplant Nutrition Guidelines including food safety and drug/nutrient interactions.   Written materials, recipes and on line resources provided: Yes; d/t blindness written info given to pts aunt(caregiver).      Demonstrated understanding of education: Yes    Barriers: blind, but verbalizes understanding to spoken instruction.   Education was provided taking into consideration the patient's learning abilities, readiness to learn and cultural beliefs    Goals of nutrition therapy:  Anabolism, Adequate intake of all nutrients, continue good BG control and avoid overcorrection w/ hypoglycemia, increase physical activity to minimum 30 min intention exercise within limitations of blindness per day    Plan:  Review of nutritional status with transplant team at selection conference   Needs follow up: Yes    RD contact information provided to patient and Marline Backbone for follow  up questions as they arise    Dorita Sciara, RD  [email or web address] .Bennie Pierini  [phone]

## 2020-01-29 ENCOUNTER — Ambulatory Visit (HOSPITAL_COMMUNITY): Payer: Self-pay

## 2020-01-29 ENCOUNTER — Ambulatory Visit: Payer: Medicare Other | Attending: Internal Medicine | Admitting: Surgery

## 2020-01-29 ENCOUNTER — Encounter (HOSPITAL_BASED_OUTPATIENT_CLINIC_OR_DEPARTMENT_OTHER): Payer: Self-pay | Admitting: Transplant

## 2020-01-29 ENCOUNTER — Other Ambulatory Visit (HOSPITAL_BASED_OUTPATIENT_CLINIC_OR_DEPARTMENT_OTHER): Payer: Self-pay

## 2020-01-29 ENCOUNTER — Encounter (HOSPITAL_BASED_OUTPATIENT_CLINIC_OR_DEPARTMENT_OTHER): Payer: Self-pay

## 2020-01-29 ENCOUNTER — Ambulatory Visit (HOSPITAL_BASED_OUTPATIENT_CLINIC_OR_DEPARTMENT_OTHER): Payer: Medicare Other | Admitting: Transplant

## 2020-01-29 ENCOUNTER — Ambulatory Visit (HOSPITAL_BASED_OUTPATIENT_CLINIC_OR_DEPARTMENT_OTHER): Payer: Medicare Other

## 2020-01-29 VITALS — Ht 65.75 in | Wt 146.6 lb

## 2020-01-29 DIAGNOSIS — Z01818 Encounter for other preprocedural examination: Secondary | ICD-10-CM

## 2020-01-29 DIAGNOSIS — E1122 Type 2 diabetes mellitus with diabetic chronic kidney disease: Secondary | ICD-10-CM | POA: Insufficient documentation

## 2020-01-29 DIAGNOSIS — Z992 Dependence on renal dialysis: Secondary | ICD-10-CM | POA: Insufficient documentation

## 2020-01-29 DIAGNOSIS — N186 End stage renal disease: Secondary | ICD-10-CM

## 2020-01-29 DIAGNOSIS — E1021 Type 1 diabetes mellitus with diabetic nephropathy: Secondary | ICD-10-CM

## 2020-01-29 LAB — PARTIAL THROMBOPLASTIN TIME: Partial Thromboplastin Time: 28 s (ref 22–35)

## 2020-01-29 LAB — CBC, DIFF
% Basophils: 0 %
% Eosinophils: 1 %
% Immature Granulocytes: 0 %
% Lymphocytes: 16 %
% Monocytes: 8 %
% Neutrophils: 75 %
% Nucleated RBC: 0 %
Absolute Eosinophil Count: 0.07 10*3/uL (ref 0.00–0.50)
Absolute Lymphocyte Count: 1.02 10*3/uL (ref 1.00–4.80)
Basophils: 0.01 10*3/uL (ref 0.00–0.20)
Hematocrit: 25 % — ABNORMAL LOW (ref 36–45)
Hemoglobin: 7.9 g/dL — ABNORMAL LOW (ref 11.5–15.5)
Immature Granulocytes: 0.02 10*3/uL (ref 0.00–0.05)
MCH: 29.9 pg (ref 27.3–33.6)
MCHC: 31.6 g/dL — ABNORMAL LOW (ref 32.2–36.5)
MCV: 95 fL (ref 81–98)
Monocytes: 0.49 10*3/uL (ref 0.00–0.80)
Neutrophils: 4.82 10*3/uL (ref 1.80–7.00)
Nucleated RBC: 0 10*3/uL
Platelet Count: 203 10*3/uL (ref 150–400)
RBC: 2.64 10*6/uL — ABNORMAL LOW (ref 3.80–5.00)
RDW-CV: 13.6 % (ref 11.6–14.4)
WBC: 6.43 10*3/uL (ref 4.3–10.0)

## 2020-01-29 LAB — HEPATITIS B BATTERY (HBSAG W/RFLX PCR, ANTI-HBS, ANIT-HBC)
Hepatitis B Core Ab: NONREACTIVE
Hepatitis B Surf Antibody Intl Units: 18.09 m[IU]/mL
Hepatitis B Surface Ab: REACTIVE
Hepatitis B Surface Antigen w/Reflex: NONREACTIVE

## 2020-01-29 LAB — BASIC METABOLIC PANEL
Anion Gap: 11 (ref 4–12)
Calcium: 9.3 mg/dL (ref 8.9–10.2)
Carbon Dioxide, Total: 32 meq/L (ref 22–32)
Chloride: 96 meq/L — ABNORMAL LOW (ref 98–108)
Creatinine: 8.75 mg/dL — ABNORMAL HIGH (ref 0.38–1.02)
Glucose: 238 mg/dL — ABNORMAL HIGH (ref 62–125)
Potassium: 4.5 meq/L (ref 3.6–5.2)
Sodium: 139 meq/L (ref 135–145)
Urea Nitrogen: 39 mg/dL — ABNORMAL HIGH (ref 8–21)
eGFR by CKD-EPI: 5 mL/min/{1.73_m2} — ABNORMAL LOW (ref >59)

## 2020-01-29 LAB — HEPATITIS A IMMUNE STATUS IGG: Hepatitis A IgG Antibody: NONREACTIVE

## 2020-01-29 LAB — LIPID PANEL
Cholesterol (LDL): 26 mg/dL (ref <130)
Cholesterol/HDL Ratio: 1.8
HDL Cholesterol: 64 mg/dL (ref >39)
Non-HDL Cholesterol: 48 mg/dL (ref 0–159)
Total Cholesterol: 112 mg/dL (ref <200)
Triglyceride: 111 mg/dL (ref <150)

## 2020-01-29 LAB — HEPATIC FUNCTION PANEL
ALT (GPT): 75 U/L — ABNORMAL HIGH (ref 7–33)
AST (GOT): 47 U/L — ABNORMAL HIGH (ref 9–38)
Albumin: 4.3 g/dL (ref 3.5–5.2)
Alkaline Phosphatase (Total): 166 U/L — ABNORMAL HIGH (ref 25–112)
Bilirubin (Direct): 0.2 mg/dL (ref 0.0–0.3)
Bilirubin (Total): 0.5 mg/dL (ref 0.2–1.3)
Protein (Total): 7.4 g/dL (ref 6.0–8.2)

## 2020-01-29 LAB — PHOSPHATE: Phosphate: 3.3 mg/dL (ref 2.5–4.5)

## 2020-01-29 LAB — HEPATITIS C AB WITH REFLEX PCR: Hepatitis C Antibody w/Rflx PCR: NONREACTIVE

## 2020-01-29 LAB — PROTHROMBIN TIME
Prothrombin INR: 1.2 (ref 0.8–1.3)
Prothrombin Time Patient: 14.4 s (ref 10.7–15.6)

## 2020-01-29 LAB — BLOOD TYPE: ABO/Rh: B POS

## 2020-01-29 NOTE — Progress Notes (Signed)
Created an error

## 2020-01-29 NOTE — Progress Notes (Signed)
Transplant Surgery Outpatient Consult - Kidney Transplant Evaluation Note  Encounter Date: 01/29/2020  Accompanied By: This patient is accompanied in the office by her aunt    Chief Concern: Evaluation for kidney transplant     Audrey Bullock presents to the [facility] for kidney transplant evaluation on 01/29/2020.       History of Present Illness:  Audrey Bullock is a 34 year old female who presents for kidney transplant evaluation. 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  She was referred years ago but was lost to follow up unfortunately after having to move to [place] in 2015.    She has a history of HT and PRES with residual blindness as well.  She uses ~30 units of insulin daily with a recent HbA1c just over 7 recently.  She has an implantable pump.  She has gastroparesis and retinopathy.  She is very motivated to pursue pancreas and kidney transplant but understands pancreas may not be an option given her arterial disease.    A nuclear medicine stress test was negative on 01/18/2020    Carotid duplex is not available for review    Past Medical History:  DMI 1  HT  Anemia  PRES w/ residual blindness  Gastroparesis  ESRD    Past Surgical History:  Feeding tube placement - subsequently removed after PRES improved  Ankle surgery    Allergies:  Review of patient's allergies indicates:  No Known Allergies    Medications:  Current Outpatient Medications   Medication Sig Dispense Refill   . acetaminophen 500 MG tablet Take 1 tablet (500 mg) by mouth every 6 hours as needed for pain. 50 tablet 0   . Albuterol Sulfate HFA 108 (90 Base) MCG/ACT Inhalation Aero Soln Inhale 2 puffs by mouth.     . Brimonidine Tartrate 0.2 % Ophthalmic Solution Place 1 drop into left EYE 2 times a day. 10 mL 0   . chlorhexidine gluconate 0.12 % solution Swish and Spit 15 mL 2 times a day. 118 mL 0   . CloNIDine HCl 0.1 MG Oral Tab Take 0.3 mg by mouth.      . HydrALAZINE HCl 100 MG Oral Tab Take 100 mg by mouth.     . Hydrocortisone 1 % External Cream      . Insulin Glargine (LANTUS SOLOSTAR) 100 UNIT/ML Subcutaneous Solution Pen-injector INJECT 15 UNITS INTO THE SKIN DAILY IN CASE OF PUMP FAILURE     . Insulin Lispro (HUMALOG) 100 UNIT/ML Subcutaneous Solution INJECT 6 TO 10 UNITS SUBCUTANEOUSLY BEFORE MEALS IF NEEDED     . LevETIRAcetam 500 MG Oral Tab Take 500 mg by mouth.     . Metoclopramide HCl 5 MG Oral Tab take 1 tablet by mouth three times a day     . oxyCODONE 5 MG tablet Take 1 tablet (5 mg) by mouth every 6 hours as needed for severe pain. 6 tablet 0   . OxyCODONE HCl 5 MG Oral Tab Take 5 mg by mouth.     . PrednisoLONE Acetate 1 % Ophthalmic Suspension Place 1 drop into left EYE every 6 hours. 1 bottle 2   . Promethazine-Codeine 6.25-10 MG/5ML Oral Syrup Take 5 mL by mouth.       Current Facility-Administered Medications   Medication Dose Route Frequency Provider Last Rate Last Admin   . pneumococcal 13-valent conjugate vaccine (Prevnar 13) injection 0.5 mL  0.5  mL Intramuscular Once Alm, Arlyn, RN       . Tdap vaccine (Adacel) injection 0.5 mL  0.5 mL Intramuscular Once Alm, Arlyn, RN           Physical Exam:  There were no vitals filed for this visit.  General: awake, alert, no distress  CV: normal rate, regular rhythm, no murmurs, rubs or gallops  Lungs: clear to auscultation bilaterally  Abdomen: soft, non-tender, non-distended, no hepatomegaly  Extremities: warm and well perfused; no edema  Pulses: fermoral pulses palpable b/l; pedal pulses and popliteal pulses are not palpable    Laboratory Tests / Studies:   Stress test: Yes, see HPI  Echocardiogram: Yes, reviewed  CT: reviewed  Carotid Duplex: unavailable for review  LE Duplex: unavailable for review     Impression and Plan:  [name] is a 34 year old female with ESRD due to DM    - I believe she is an average risk candidate for kidney transplant.  - Will discuss potential pancreas with the  surgical committee.  - Would like to evaluate carotid duplex    We discussed the advantages of the living donor kidney transplantation over the deceased donor transplantation.   We discussed the different types of donors namely DCD, High KDPI kidneys, risk criteria of the donors, donor acquired infections, and donor acquired malignancy.   We discussed complications of kidney transplantation including bleeding, hematoma/seroma/lymphocele, renal vein stenosis/thrombosis, renal artery stenosis/thrombosis, ureter leaking/stenosis/stricture, central venous access issues, re-operation / interventional radiology, biopsies and possible complications, kidney non-function/delayed, recurrence of original kidney disease and incisional hernia . We also discussed the rare incidence of deep vein clots, pulmonary embolism including death.  We discussed complications of immunosuppressive medication including side effects, rejection/treatment, infection, cancer, and the results of non-compliance.   We also discussed complication of major surgery including poor wound healing/dehiscence and other problems, cardiac problems, anesthetic, stroke and other neurological problems, death.    Micheline Maze case will be discussed by the Transplant team in our weekly transplant meeting and a decision regarding candidacy, further workup and listing will be made at that time.     Thank you for the opportunity to evaluate this patient.

## 2020-01-29 NOTE — Progress Notes (Signed)
PSYCHOSOCIAL ASSESSMENT - KIDNEY TRANSPLANT    Identifying Information    Audrey Bullock is a 34 year old female referred to the [facility] to be evaluated for a kidney transplant. [name] was accompanied by her aunt Audrey Bullock ([phone]), and was interviewed in the Transplant Clinic on July 27th, 2021 and provided the following information.     Family Support System    Audrey Bullock lives in [place], [place] with her aunt Audrey Bullock, Audrey Bullock's family, and Audrey Bullock's son [name]. She was divorced in 2012 and is currently single. She reports having been born in [place], [place] and moving to [place] when she was [employer]. She explained that she moved back to [place] in 2016 but then came back to [place] in 2017 after having a stroke. She reports having moved back to [place] again in 2018, but eventually moved back to [facility] in November 2020 with no plans to move in the future. The patient has two sisters and four brothers, all of whom live in [place] except for one brother who lives in [place] state. Her mom, Audrey Bullock, lives in [place], [place] while her dad lives in [place].     Typical post-transplant care needs were discussed with the patient and Audrey Bullock, including the requirement that she have a full-time, 24/7 caregiver post-transplant for 3-4 weeks. Audrey Bullock has identified herself as the patient's primary caregiver, an arrangement that works out well as Audrey Bullock is already the patient's paid caregiver through the [facility] program. The patient reports that her mother, Audrey Bullock [phone]), is available to be her backup caregiver.     Employment/Income    The patient completed her GED as well as a 9 month medical receptionist program at [employer]. 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 She had hoped to attend college in [place] with the goal of becoming a medical social worker, but reports that she was unable to start the program prior to her stroke in 2017.     The  patient stopped working in 2016 in order to start school (working on prerequisites for the social work program she hoped to attend prior to her stroke). She is currently on SSDI and makes $1500/per month between SSDI and the financial support she receives through her son's SSI payments. She has medical coverage through [facility] and Medicaid.     Medical History    The patient was diagnosed with Type 1 diabetes at the age of 42. She explained that she developed kidney disease in 2012, one year after the birth of her child. She reports that she lost her eyesight in 2017 after her stroke, and that she can now only see light, shadows, and some colors. She states that she has been on dialysis for 10 years, and that she currently receives dialysis at [facility] in [place] on Mondays, Wednesdays, and Fridays. She reports no complications from dialysis and does not ever skip sessions or ask to be taken off the machine early.      Currently the patient is taking nine medications. She does not use a pill box, but states that she "always remembers to take" her medication and has not had any issues swallowing them.     Expectations of Transplant    The patient is investigating a kidney transplant with the expectation of feeling healthier, and "liv(ing) a full life". She hopes to go back to work, to go "full force" with job training, and to get off dialysis.  The patient has received the [facility] kidney transplant patient educational packet, but her family has not read the whole packet to her yet. She presented today with a moderate understanding of the risks versus benefits of a kidney transplant including the risks of rejections, the need for ongoing immunosuppressive medications, and the frequent outpatient follow-up appointments.     Psychosocial    Audrey Bullock presented as alert and oriented. She answered all questions in an open and non-defensive manner. She presented as well groomed and appropriately dressed. She maintained reasonable  eye contact throughout the interview (with the important context that the patient is vision impaired). Speech was of a normal rate, volume, and clarity. Affect was within normal limits. Her recent moods have been good. Thought process was linear and goal directed. There were no signs of any psychosis, irrational fears, obsessions, or phobias.     Audrey Bullock denies any significant mental health issues and reports no history of depression, anxiety, suicidal ideation, suicide attempts or psychiatric hospitalizations. Her coping skills include praying, talking to her best friend, and listening to TV/music. She denies any current substance use including tobacco products, recreational drugs, and alcohol. She states that she "used to be a social drinker" and enjoyed drinking when she would go out to clubs with friends, but states that she has not had a drink since starting dialysis as it makes her feel sick.     Assessment    Audrey Bullock presents as a pleasant, cooperative individual motivated towards a kidney transplant with the goal of getting off dialysis, having an improved quality of life, and returning to work. She presents with a moderate understanding of what to expect from a course of transplantation. She has sufficient medical insurance at this time and her finances appear adequate to cover most costs associated with a kidney transplant. She appears to have the necessary post-transplant caregiver support. There are no mental health or substance use concerns.     The patient has a Animator for Transplant (SIPAT) score of 14, which is in the "Good Candidate" range. From a psychosocial point of view the patient does appear to be a good candidate for a kidney transplant. There are no apparent issues that would interfere with her ability to understand transplant educational material or to comply with post-transplant self-care guidelines and medication management.     Social Work will  remain available for assistance as needed.      Jeannetta Nap, MSW  Per Cytogeneticist for [employer], MSW, [facility]  Social Work Care and Coordination  [place]

## 2020-01-30 LAB — CMV IMMUNE STATUS BY EIA: CMV Immune Status Result: POSITIVE

## 2020-01-30 LAB — SYPHILIS SCREEN: T. pallidum IgG/IgM Ab (Bioplex): NONREACTIVE

## 2020-01-30 LAB — ANTI TOXOPLASMA, IGG
Anti Toxoplasma, IgG Interp: NONREACTIVE
Anti Toxoplasma, IgG Result: <3.2 [IU]/mL (ref 0.0–7.4)

## 2020-01-30 LAB — HIV ANTIGEN AND ANTIBODY SCRN
HIV Antigen and Antibody Interpretation: NONREACTIVE
HIV Antigen and Antibody Result: NONREACTIVE

## 2020-01-30 LAB — RUBEOLA IMMUNE STATUS BY EIA: Rubeola Immune Status Result: POSITIVE

## 2020-01-30 LAB — MUMPS IMMUNE STATUS BY EIA: Mumps Immune Status Result: POSITIVE

## 2020-01-30 LAB — HEMOGLOBIN A1C, HPLC: Hemoglobin A1C: 8.5 % — ABNORMAL HIGH (ref 4.0–6.0)

## 2020-01-30 LAB — RUBELLA IMMUNE STATUS BY EIA
Rubella Antibody Level: >20 [IU]/mL
Rubella Immune Status Result: POSITIVE

## 2020-01-30 LAB — VZV IMMUNE STATUS BY IFA: Varicella Zoster Immune Status Result: >1:8 {titer}

## 2020-01-31 ENCOUNTER — Encounter (HOSPITAL_BASED_OUTPATIENT_CLINIC_OR_DEPARTMENT_OTHER): Payer: Self-pay

## 2020-01-31 LAB — QUANTIFERON-TB GOLD PLUS
Quantiferon Nil Result: 0.05 IU gamma IF/mL
Quantiferon TB Interpretation: NEGATIVE
Quantiferon TB Mitogen minus Nil Result: 1.27 IU gamma IF/mL
Quantiferon TB1 Ag minus Nil Result: 0 IU gamma IF/mL
Quantiferon TB2 Ag minus Nil Result: 0 IU gamma IF/mL

## 2020-01-31 NOTE — Committee Review (Signed)
Committee Review Note     Evaluation Date: 12/20/2019  Committee Review Date: 01/31/2020    Organ being evaluated for: Kidney    Transplant Phase: Evaluation  Transplant Status: Active    Transplant Coordinator: Hulan Saas    Referring Physician: Corrie Mckusick    Primary Diagnosis: Diabetes Mellitus - Type I    Committee Review Members:  [place], RD   Financial Coordinator Shirley Friar   Nephrology Michaell Cowing, ARNP, Elizabeth Sauer, MD, Kathlene November, ARNP, Winnifred Friar, MD, Lucas Mallow, MD   Program Coordinator Koleen Distance, Coordinator, Danton Sewer, Coordinator, Shiela Mayer, CMA, Bartolo Darter, Coordinator   Psychiatry Eddie North, MD   Social Work  [name], LICSW, Thereasa Distance, MSW   Transplant Coordinator Jonette Eva, RN, Henrine Screws, RN   Transplant Surgery Anastasia Fiedler, MD, Melissa Noon, MD, Sela Hua, MD, Doristine Devoid, MD       Transplant Eligibility: End stage renal disease on dialysis    Committee Review Decision: Declined    Relative Contraindications: None    Absolute Contraindications: Severe aortoiliac atherosclerosis precluding successful transplant or to induce significant risk to the patient during surgery (limb loss or death)    Committee Discussion Details:   Denied, no re-referral. Not a candidate due to severe calcifications  I attest to the committee's decision for this patient.  02/01/2020 Lucas Mallow, MD

## 2020-02-01 ENCOUNTER — Telehealth (HOSPITAL_BASED_OUTPATIENT_CLINIC_OR_DEPARTMENT_OTHER): Payer: Self-pay

## 2020-02-01 ENCOUNTER — Encounter (HOSPITAL_BASED_OUTPATIENT_CLINIC_OR_DEPARTMENT_OTHER): Payer: Self-pay

## 2020-02-01 LAB — NICOTINE AND METABOLITES
Cotinine (Sendout): <3 ng/mL (ref <3.0)
Nicotine (Sendout): <3 ng/mL (ref <3.0)

## 2020-02-01 NOTE — Telephone Encounter (Signed)
Spoke to pt after [facility] informed about the denial due to lack of surgical access. She understood. She has appt with [facility] next month to seek second opinion.    Transplant Status Letter: Denial Letter  . Notification letter was mailed to patient   A copy was sent to the patient, referring provider and dialysis center .     Jenny,RN from [facility] informed via e-mail  Txp episode closed.

## 2020-02-05 LAB — EBV SEROLOGY
EBV IgG Antibody: POSITIVE
EBV IgM Antibody: NEGATIVE
EBV Nuclear Antibody: POSITIVE

## 2020-02-06 LAB — CANNABINOIDS QUANTITATIVE, SERUM (SENDOUT)
11-Hydroxy-Delta-9 THC, Serum: NOT DETECTED ng/mL
Delta-9 Carboxy THC, Serum: NOT DETECTED ng/mL
Delta-9 THC, Serum: NOT DETECTED ng/mL

## 2020-02-08 LAB — HERPES TYPE 1 & 2 SEROLOGY
HSV1 WB Result: NEGATIVE
HSV2 WB Result: POSITIVE

## 2020-02-12 LAB — HLA CLASS 1(A,B,C) TYPING (SENDOUT)

## 2020-02-12 LAB — HLA CLASS II DR,DQ TYPING (SENDOUT)

## 2020-02-12 LAB — HLA TESTING (SENDOUT)

## 2020-02-12 LAB — HLA AB DETECTION (SENDOUT)

## 2020-02-24 ENCOUNTER — Emergency Department
Admission: EM | Admit: 2020-02-24 | Discharge: 2020-02-24 | Disposition: A | Discharge status: Home / Self Care | Payer: No Typology Code available for payment source | Attending: Emergency Medicine | Admitting: Emergency Medicine

## 2020-02-24 ENCOUNTER — Other Ambulatory Visit: Payer: Self-pay

## 2020-02-24 ENCOUNTER — Emergency Department: Payer: No Typology Code available for payment source

## 2020-02-24 DIAGNOSIS — R519 Headache, unspecified: Secondary | ICD-10-CM | POA: Diagnosis not present

## 2020-02-24 DIAGNOSIS — Z87891 Personal history of nicotine dependence: Secondary | ICD-10-CM | POA: Diagnosis not present

## 2020-02-24 DIAGNOSIS — J181 Lobar pneumonia, unspecified organism: Secondary | ICD-10-CM | POA: Diagnosis not present

## 2020-02-24 DIAGNOSIS — R05 Cough: Secondary | ICD-10-CM | POA: Diagnosis present

## 2020-02-24 DIAGNOSIS — Z79899 Other long term (current) drug therapy: Secondary | ICD-10-CM | POA: Insufficient documentation

## 2020-02-24 DIAGNOSIS — Z20822 Contact with and (suspected) exposure to covid-19: Secondary | ICD-10-CM | POA: Diagnosis not present

## 2020-02-24 DIAGNOSIS — J189 Pneumonia, unspecified organism: Secondary | ICD-10-CM

## 2020-02-24 LAB — SARS CORONAVIRUS 2 BY RT PCR (HOSPITAL ORDER, PERFORMED IN ~~LOC~~ HOSPITAL LAB): SARS Coronavirus 2: NEGATIVE

## 2020-02-24 MED ORDER — HYDROCOD POLST-CPM POLST ER 10-8 MG/5ML PO SUER
5.0000 mL | Freq: Once | ORAL | Status: AC
  Administered 2020-02-24: 5 mL via ORAL
  Filled 2020-02-24: qty 5

## 2020-02-24 MED ORDER — HYDROCOD POLST-CPM POLST ER 10-8 MG/5ML PO SUER: 5.0000 mL | Freq: Two times a day (BID) | ORAL | 0 refills | Status: AC

## 2020-02-24 MED ORDER — AZITHROMYCIN 250 MG PO TABS: 250.0000 mg | ORAL_TABLET | Freq: Every day | ORAL | 0 refills | Status: AC

## 2020-02-24 MED ORDER — AZITHROMYCIN 500 MG PO TABS
500.0000 mg | ORAL_TABLET | Freq: Once | ORAL | Status: AC
  Administered 2020-02-24: 500 mg via ORAL
  Filled 2020-02-24: qty 1

## 2020-02-24 NOTE — ED Triage Notes (Signed)
Pt states sinus pressure, headache, cough, sore throat. Pt with unlabored resps.

## 2020-02-24 NOTE — ED Provider Notes (Signed)
[facility] Emergency Department Provider Note   ____________________________________________   First MD Initiated Contact with Patient 02/24/20 [phone]     (approximate)  I have reviewed the triage vital signs and the nursing notes.   HISTORY  Chief Complaint Headache, Cough, and Sore Throat    HPI CARIA TRANSUE is a 34 y.o. female who presents to the ED from home with a chief complaint of cold-like symptoms. Patient complains of a 1 week history of sinus pressure, headache, nonproductive cough and sore throat. Toddler daughter also with similar symptoms. Denies chest pain, shortness of breath, abdominal pain, nausea or vomiting.       Past Medical History:  Diagnosis Date  . Back pain   . Chlamydia   . Depression   . Eczema   . Headache   . Hemorrhoids   . Obesity (BMI 30-39.9)   . Post traumatic stress disorder (PTSD)     Patient Active Problem List   Diagnosis Date Noted  . Postpartum hemorrhage 10/25/2018  . Acute blood loss anemia 10/25/2018  . Labor and delivery indication for care or intervention 10/23/2018  . Decreased fetal movement affecting management of mother, antepartum 08/20/2018    Past Surgical History:  Procedure Laterality Date  . COLONOSCOPY WITH PROPOFOL N/A 05/19/2015   Procedure: COLONOSCOPY WITH PROPOFOL;  Surgeon: Hulen Luster, MD;  Location: [facility] ENDOSCOPY;  Service: Gastroenterology;  Laterality: N/A;  . DILATION AND CURETTAGE, DIAGNOSTIC / THERAPEUTIC    . EYE SURGERY    . TONSILLECTOMY      Prior to Admission medications   Medication Sig Start Date End Date Taking? Authorizing Provider  acetaminophen (TYLENOL) 500 MG tablet Take 2 tablets (1,000 mg total) by mouth every 6 (six) hours as needed for mild pain or moderate pain. 10/25/18   Lisette Grinder, CNM  azithromycin (ZITHROMAX) 250 MG tablet Take 1 tablet (250 mg total) by mouth daily. 02/24/20   Paulette Blanch, MD  cetirizine (ZYRTEC) 10 MG tablet Take 10  mg by mouth daily.    [provider]  chlorpheniramine-HYDROcodone (TUSSIONEX PENNKINETIC ER) 10-8 MG/5ML SUER Take 5 mLs by mouth 2 (two) times daily. 02/24/20   Paulette Blanch, MD  clonazePAM (KLONOPIN) 1 MG tablet Take 1 mg by mouth as needed for anxiety.    [provider]  ferrous sulfate 325 (65 FE) MG tablet Take 1 tablet (325 mg total) by mouth 2 (two) times daily with a meal. 10/25/18   Lisette Grinder, CNM  fluticasone (FLONASE) 50 MCG/ACT nasal spray Place 2 sprays into both nostrils daily. 03/25/17 04/08/17  Duffy Bruce, MD  ibuprofen (ADVIL) 600 MG tablet Take 1 tablet (600 mg total) by mouth every 6 (six) hours as needed for mild pain, moderate pain or cramping. 10/25/18   Lisette Grinder, CNM  ondansetron (ZOFRAN-ODT) 4 MG disintegrating tablet Take 1 tablet (4 mg total) by mouth every 8 (eight) hours as needed for nausea or vomiting. 10/25/18   Lisette Grinder, CNM  Prenatal Vit-Fe Fumarate-FA (PRENATAL VITAMIN) 27-0.8 MG TABS Take 1 tablet by mouth daily. 10/25/18   Lisette Grinder, CNM  saccharomyces boulardii (FLORASTOR) 250 MG capsule Take 250 mg by mouth 2 (two) times daily.    [provider]  senna-docusate (SENOKOT-S) 8.6-50 MG tablet Take 2 tablets by mouth at bedtime as needed for mild constipation. 10/25/18   Lisette Grinder, CNM  sertraline (ZOLOFT) 100 MG tablet Take 200 mg by mouth daily.    [provider]  vitamin C (VITAMIN C) 250 MG tablet Take 1 tablet (250 mg total) by mouth 2 (two) times daily with a meal. 10/25/18   Lisette Grinder, CNM    Allergies Patient has no known allergies.  No family history on file.  Social History Social History   Tobacco Use  . Smoking status: Former Research scientist (life sciences)  . Smokeless tobacco: Never Used  Substance Use Topics  . Alcohol use: No  . Drug use: No    Review of Systems  Constitutional: No fever/chills Eyes: No visual changes. ENT: Positive for sinus pressure, congestion and  sore throat. Cardiovascular: Denies chest pain. Respiratory: Positive for nonproductive cough. Denies shortness of breath. Gastrointestinal: No abdominal pain.  No nausea, no vomiting.  No diarrhea.  No constipation. Genitourinary: Negative for dysuria. Musculoskeletal: Negative for back pain. Skin: Negative for rash. Neurological: Positive for headache. Negative for focal weakness or numbness.   ____________________________________________   PHYSICAL EXAM:  VITAL SIGNS: ED Triage Vitals  Enc Vitals Group     BP 02/24/20 0057 123/65     Pulse Rate 02/24/20 0057 77     Resp 02/24/20 0057 18     Temp 02/24/20 0057 99.5 F (37.5 C)     Temp Source 02/24/20 0057 Oral     SpO2 02/24/20 0057 99 %     Weight 02/24/20 0058 180 lb (81.6 kg)     Height 02/24/20 0058 5\' 1"  (1.549 m)     Head Circumference --      Peak Flow --      Pain Score 02/24/20 0058 5     Pain Loc --      Pain Edu? --      Excl. in [place]? --     Constitutional: Alert and oriented. Well appearing and in no acute distress. Eyes: Conjunctivae are normal. PERRL. EOMI. Head: Atraumatic. Nose: Congestion/rhinnorhea. Mouth/Throat: Mucous membranes are moist.  Oropharynx dull. Neck: No stridor. Supple neck without meningismus. Cardiovascular: Normal rate, regular rhythm. Grossly normal heart sounds.  Good peripheral circulation. Respiratory: Normal respiratory effort.  No retractions. Lungs CTAB. Gastrointestinal: Soft and nontender. No distention. No abdominal bruits. No CVA tenderness. Musculoskeletal: No lower extremity tenderness nor edema.  No joint effusions. Neurologic:  Normal speech and language. No gross focal neurologic deficits are appreciated. No gait instability. Skin:  Skin is warm, dry and intact. No rash noted. No petechiae. Psychiatric: Mood and affect are normal. Speech and behavior are normal.  ____________________________________________   LABS (all labs ordered are listed, but only abnormal  results are displayed)  Labs Reviewed  SARS CORONAVIRUS 2 BY RT PCR (HOSPITAL ORDER, [facility])   ____________________________________________  EKG  None ____________________________________________  RADIOLOGY  ED MD interpretation: Possible right lower lobe pneumonia  Official radiology report(s): DG Chest 2 View  Result Date: 02/24/2020 CLINICAL DATA:  Cough EXAM: CHEST - 2 VIEW COMPARISON:  None. FINDINGS: Patchy opacity in the right lung base, could reflect some vascular crowding/atelectasis or early airspace disease. No convincing features of edema. No pneumothorax or effusion. The cardiomediastinal contours are unremarkable. No acute osseous or soft tissue abnormality. IMPRESSION: Patchy opacity in the right lung base, could reflect some vascular crowding/atelectasis or early airspace disease. Electronically Signed   By: Lovena Le M.D.   On: 02/24/2020 02:05    ____________________________________________   PROCEDURES  Procedure(s) performed (including Critical Care):  Procedures   ____________________________________________   INITIAL IMPRESSION / ASSESSMENT AND PLAN / ED COURSE  As part of  my medical decision making, I reviewed the following data within the [facility] notes reviewed and incorporated, Labs reviewed, Radiograph reviewed and Notes from prior ED visits     SHAYONNA OCAMPO was evaluated in Emergency Department on 02/24/2020 for the symptoms described in the history of present illness. She was evaluated in the context of the global COVID-19 pandemic, which necessitated consideration that the patient might be at risk for infection with the SARS-CoV-2 virus that causes COVID-19. Institutional protocols and algorithms that pertain to the evaluation of patients at risk for COVID-19 are in a state of rapid change based on information released by regulatory bodies including the CDC and federal and state  organizations. These policies and algorithms were followed during the patient's care in the ED.    34 year old female presenting with cold-like symptoms. Covid negative, possible right lower lobe pneumonia. Will treat with Z-Pak, Tussionex to use as needed, and patient will follow up with her PCP next week. Strict return precautions given. Patient verbalizes understanding and agrees with plan of care.      ____________________________________________   FINAL CLINICAL IMPRESSION(S) / ED DIAGNOSES  Final diagnoses:  Community acquired pneumonia of right lower lobe of lung     ED Discharge Orders         Ordered    chlorpheniramine-HYDROcodone (TUSSIONEX PENNKINETIC ER) 10-8 MG/5ML SUER  2 times daily        02/24/20 0349    azithromycin (ZITHROMAX) 250 MG tablet  Daily        02/24/20 0349           Note:  This document was prepared using Dragon voice recognition software and may include unintentional dictation errors.   Paulette Blanch, MD 02/24/20 [phone]

## 2020-02-24 NOTE — Discharge Instructions (Signed)
1. Finish antibiotic as prescribed (Azithromycin 250 mg daily x4 days). Start your next dose Monday morning. 2. You may take cough medicine as needed (Tussionex). 3. Return to the ER for worsening symptoms, persistent vomiting, difficulty breathing or other concerns.

## 2020-08-31 ENCOUNTER — Inpatient Hospital Stay: Payer: Self-pay

## 2020-09-09 ENCOUNTER — Emergency Department: Payer: Self-pay

## 2020-09-10 ENCOUNTER — Emergency Department: Payer: Self-pay

## 2020-09-10 ENCOUNTER — Inpatient Hospital Stay: Payer: Self-pay

## 2020-10-20 LAB — OTHER LAB ORDER: Hemoglobin A1C: 6.8 % — ABNORMAL HIGH (ref 4.8–5.6)

## 2020-10-23 ENCOUNTER — Inpatient Hospital Stay: Payer: Self-pay

## 2021-01-15 ENCOUNTER — Inpatient Hospital Stay: Payer: Self-pay

## 2021-01-15 ENCOUNTER — Emergency Department: Payer: Self-pay

## 2021-01-22 ENCOUNTER — Emergency Department: Payer: Self-pay

## 2021-04-09 IMAGING — CR DG CHEST 2V
1 series · 2 of 2 positions shown · non-contrast
Comparison: None.

CLINICAL DATA: Cough

EXAM:
CHEST - 2 VIEW

[Series 1: dg chest 2 view · 0.14mm/px · 2 of 2 slices shown]
[im 1/2]
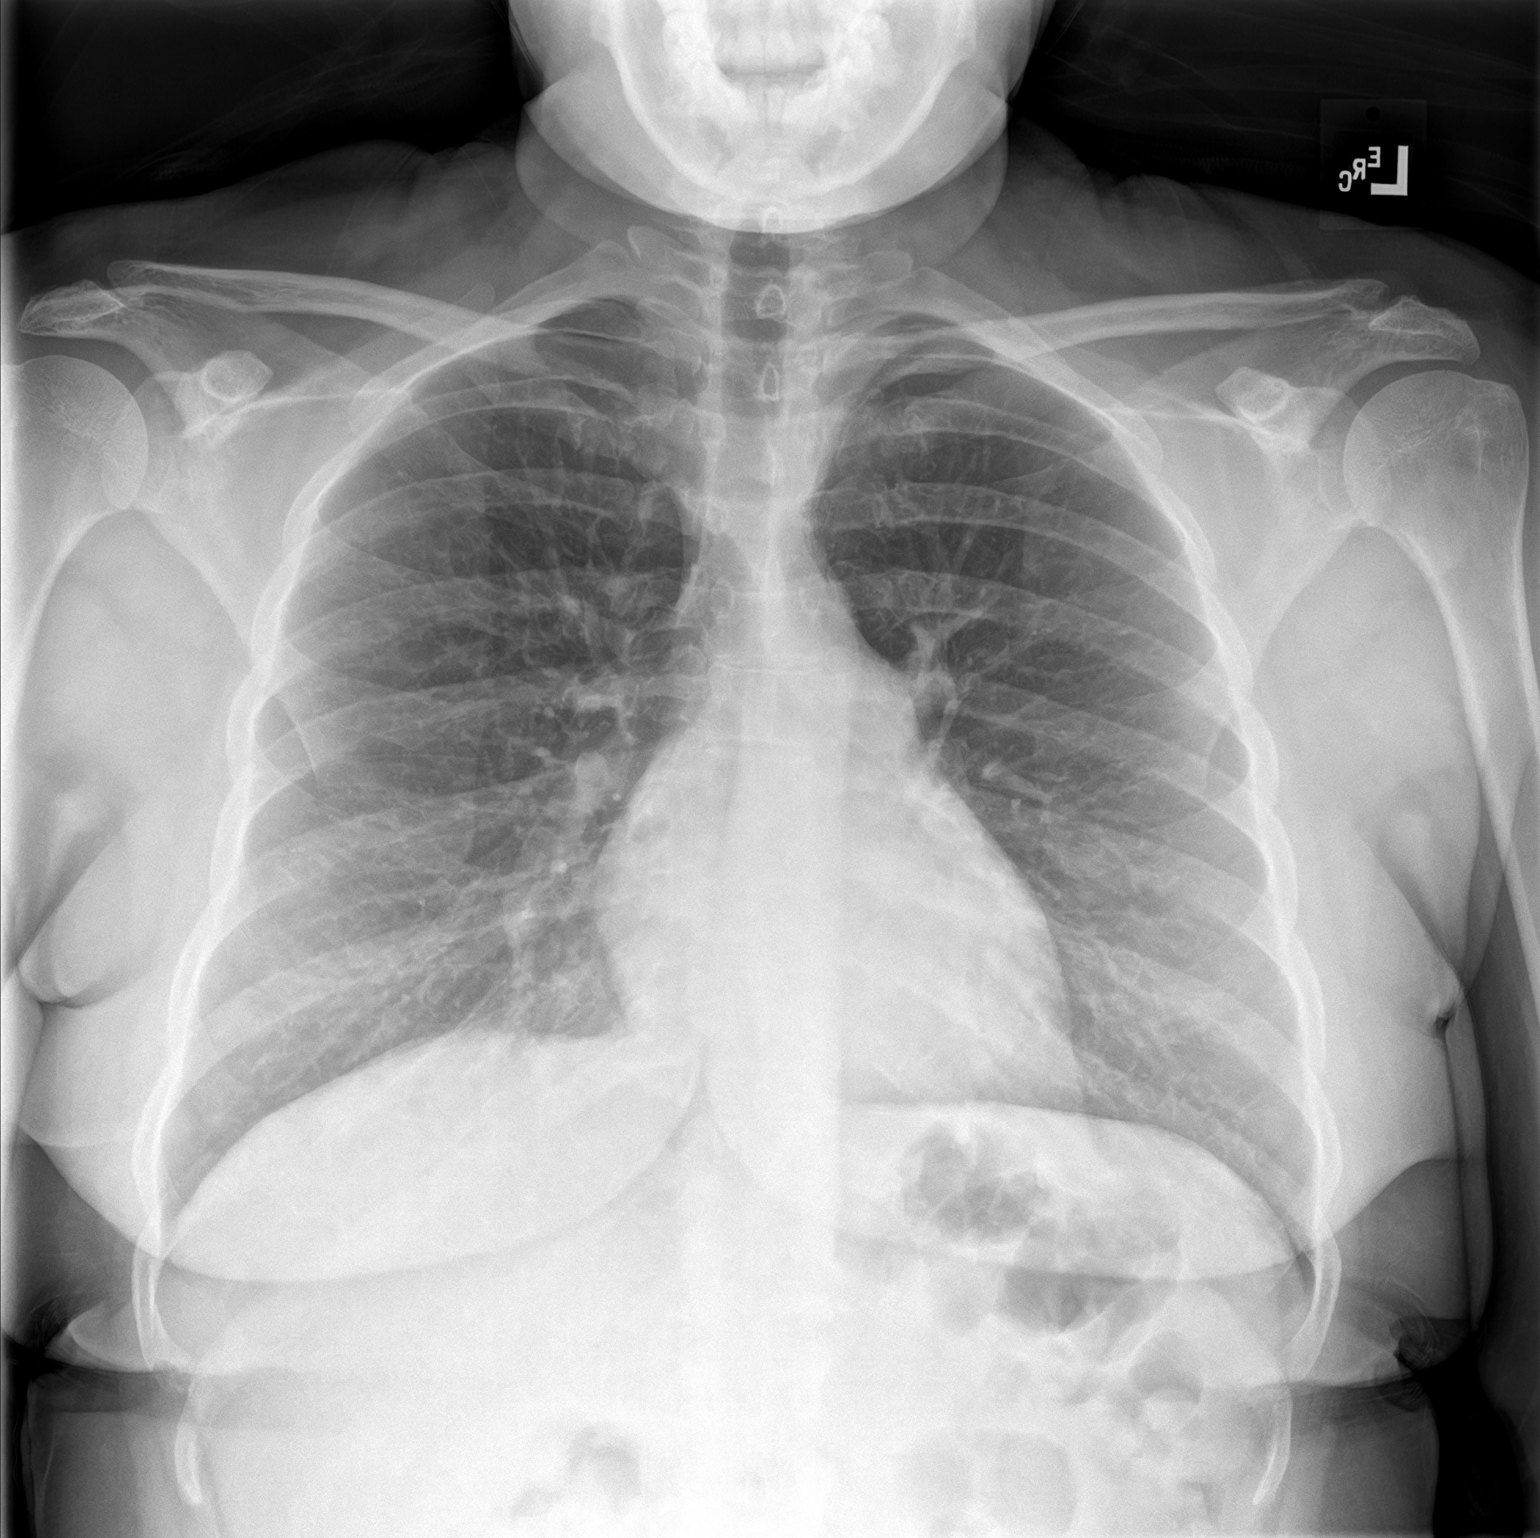
[im 2/2]
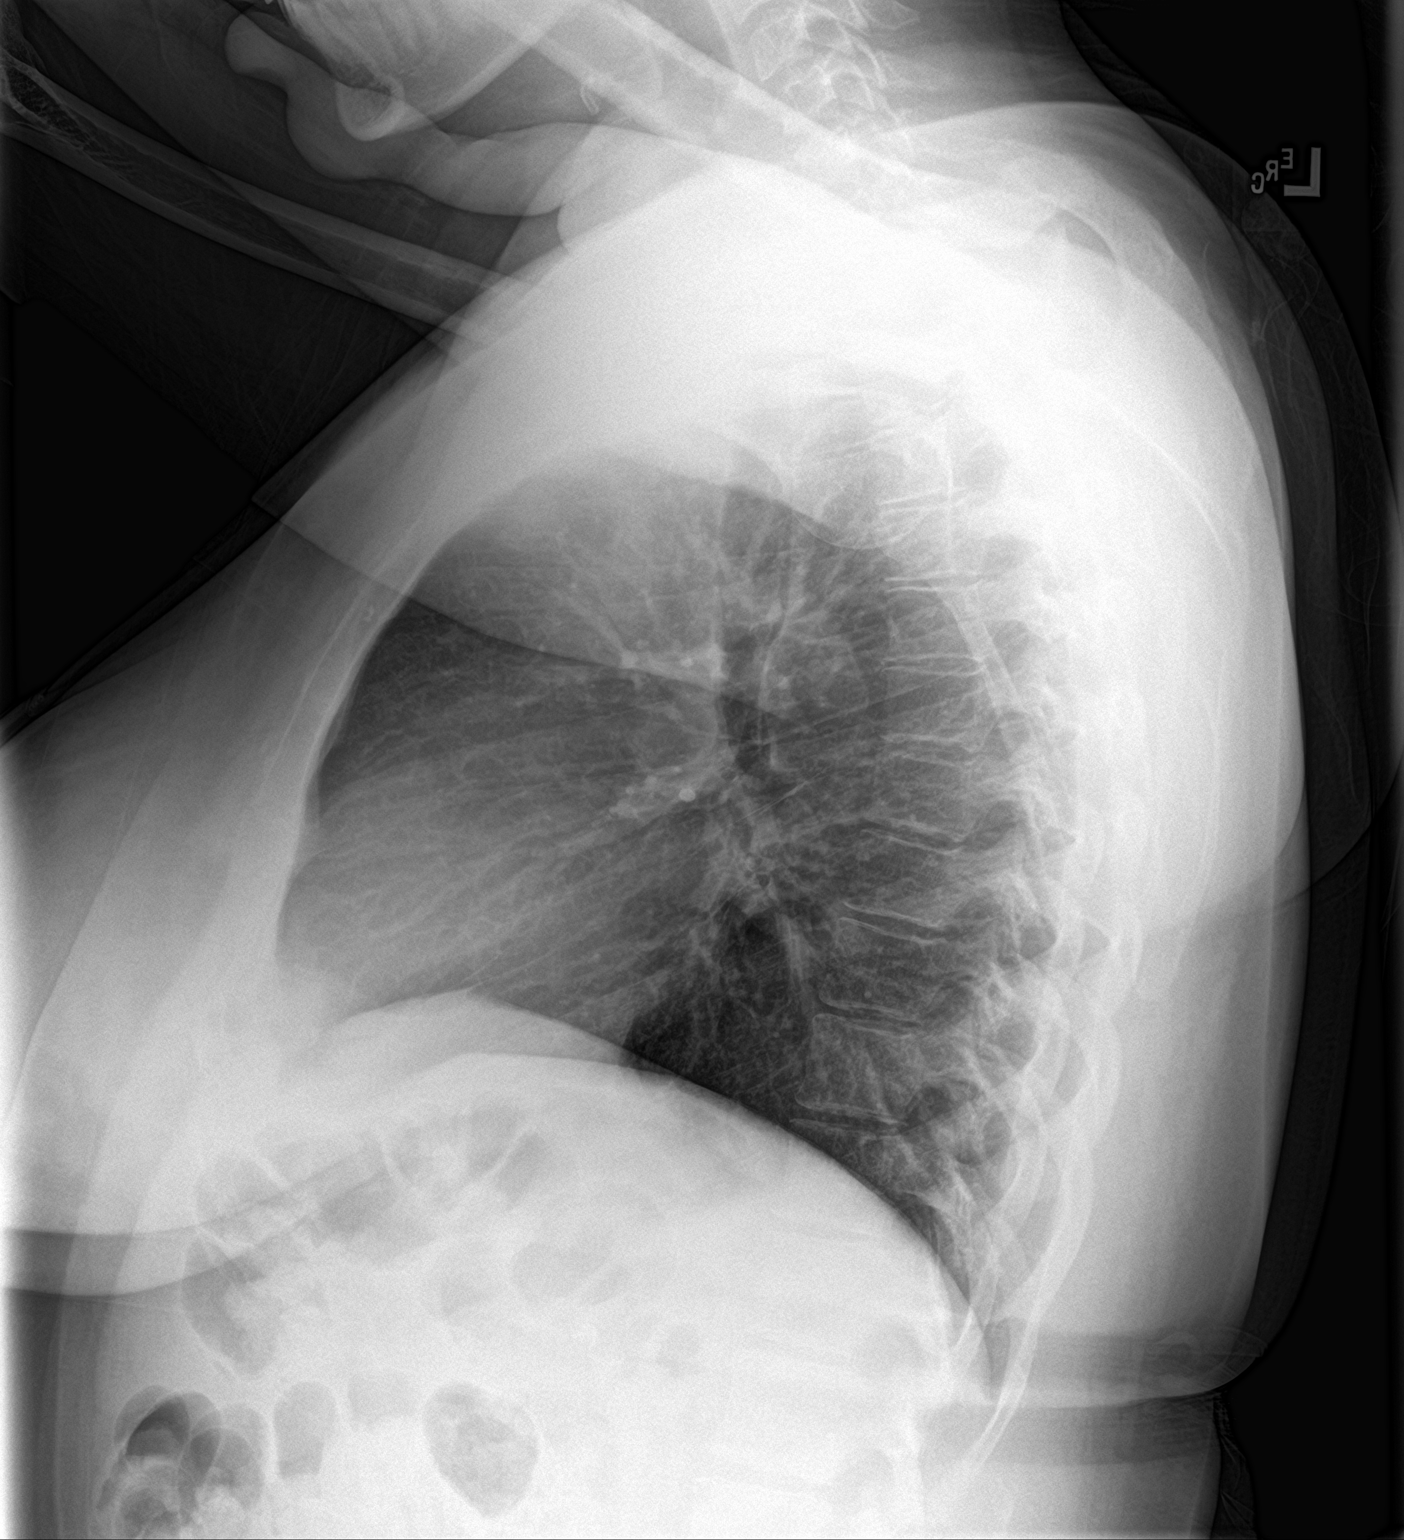

[2 of 2 positions shown; findings below may reference images not displayed]

FINDINGS: Patchy opacity in the right lung base, could reflect some vascular
crowding/atelectasis or early airspace disease. No convincing
features of edema. No pneumothorax or effusion. The
cardiomediastinal contours are unremarkable. No acute osseous or
soft tissue abnormality.
IMPRESSION: Patchy opacity in the right lung base, could reflect some vascular
crowding/atelectasis or early airspace disease.

## 2022-05-03 ENCOUNTER — Emergency Department: Payer: Self-pay

## 2022-07-26 ENCOUNTER — Emergency Department: Payer: Self-pay

## 2022-10-20 ENCOUNTER — Inpatient Hospital Stay: Payer: Self-pay

## 2022-10-20 ENCOUNTER — Emergency Department: Payer: Self-pay

## 2022-12-06 ENCOUNTER — Emergency Department: Payer: Self-pay

## 2023-05-10 LAB — PATHOLOGY, SURGICAL

## 9736-02-03 DEATH — deceased
